# Patient Record
Sex: Female | Born: 1970 | Race: Black or African American | Hispanic: No | Marital: Married | Smoking: Never smoker
Health system: Southern US, Community
[De-identification: ages and names within clinical notes are randomized; demographics above are authoritative.]

## PROBLEM LIST (undated history)

## (undated) ENCOUNTER — Ambulatory Visit (HOSPITAL_COMMUNITY): Admission: EM

## (undated) ENCOUNTER — Emergency Department (HOSPITAL_COMMUNITY): Admission: EM | Discharge status: Absent without leave | Payer: Medicare Other

## (undated) DIAGNOSIS — Z87442 Personal history of urinary calculi: Secondary | ICD-10-CM

## (undated) DIAGNOSIS — R011 Cardiac murmur, unspecified: Secondary | ICD-10-CM

## (undated) DIAGNOSIS — IMO0002 Reserved for concepts with insufficient information to code with codable children: Secondary | ICD-10-CM

## (undated) DIAGNOSIS — M329 Systemic lupus erythematosus, unspecified: Secondary | ICD-10-CM

## (undated) DIAGNOSIS — M359 Systemic involvement of connective tissue, unspecified: Secondary | ICD-10-CM

## (undated) DIAGNOSIS — E785 Hyperlipidemia, unspecified: Secondary | ICD-10-CM

## (undated) DIAGNOSIS — M1990 Unspecified osteoarthritis, unspecified site: Secondary | ICD-10-CM

## (undated) DIAGNOSIS — R51 Headache: Secondary | ICD-10-CM

## (undated) DIAGNOSIS — K3184 Gastroparesis: Secondary | ICD-10-CM

## (undated) DIAGNOSIS — F32A Depression, unspecified: Secondary | ICD-10-CM

## (undated) DIAGNOSIS — F329 Major depressive disorder, single episode, unspecified: Secondary | ICD-10-CM

## (undated) HISTORY — PX: OTHER SURGICAL HISTORY: SHX169

## (undated) HISTORY — PX: TUBAL LIGATION: SHX77

---

## 2003-09-27 ENCOUNTER — Other Ambulatory Visit: Admission: RE | Admit: 2003-09-27 | Discharge: 2003-09-27 | Payer: Self-pay | Admitting: Obstetrics and Gynecology

## 2004-03-29 ENCOUNTER — Ambulatory Visit: Payer: Self-pay | Admitting: Family Medicine

## 2004-03-29 ENCOUNTER — Inpatient Hospital Stay (HOSPITAL_COMMUNITY): Admission: AD | Admit: 2004-03-29 | Discharge: 2004-04-02 | Payer: Self-pay | Admitting: Family Medicine

## 2004-04-09 ENCOUNTER — Ambulatory Visit: Payer: Self-pay | Admitting: *Deleted

## 2004-04-17 ENCOUNTER — Ambulatory Visit: Payer: Self-pay | Admitting: *Deleted

## 2004-05-01 ENCOUNTER — Ambulatory Visit: Payer: Self-pay | Admitting: Obstetrics & Gynecology

## 2004-05-15 ENCOUNTER — Ambulatory Visit: Payer: Self-pay | Admitting: Family Medicine

## 2004-05-29 ENCOUNTER — Ambulatory Visit: Payer: Self-pay | Admitting: Obstetrics & Gynecology

## 2004-06-05 ENCOUNTER — Ambulatory Visit: Payer: Self-pay | Admitting: Obstetrics & Gynecology

## 2004-06-10 ENCOUNTER — Inpatient Hospital Stay (HOSPITAL_COMMUNITY): Admission: AD | Admit: 2004-06-10 | Discharge: 2004-06-12 | Payer: Self-pay | Admitting: Obstetrics & Gynecology

## 2004-06-10 ENCOUNTER — Ambulatory Visit: Payer: Self-pay | Admitting: Family Medicine

## 2004-06-18 ENCOUNTER — Ambulatory Visit (HOSPITAL_COMMUNITY): Admission: RE | Admit: 2004-06-18 | Discharge: 2004-06-18 | Payer: Self-pay | Admitting: *Deleted

## 2004-06-27 ENCOUNTER — Inpatient Hospital Stay (HOSPITAL_COMMUNITY): Admission: AD | Admit: 2004-06-27 | Discharge: 2004-06-27 | Payer: Self-pay | Admitting: Obstetrics & Gynecology

## 2004-06-27 ENCOUNTER — Ambulatory Visit: Payer: Self-pay | Admitting: Family Medicine

## 2004-06-28 ENCOUNTER — Inpatient Hospital Stay (HOSPITAL_COMMUNITY): Admission: AD | Admit: 2004-06-28 | Discharge: 2004-06-28 | Payer: Self-pay | Admitting: Obstetrics and Gynecology

## 2004-07-04 ENCOUNTER — Ambulatory Visit: Payer: Self-pay | Admitting: Family Medicine

## 2004-07-08 ENCOUNTER — Ambulatory Visit (HOSPITAL_COMMUNITY): Admission: RE | Admit: 2004-07-08 | Discharge: 2004-07-08 | Payer: Self-pay | Admitting: *Deleted

## 2004-07-18 ENCOUNTER — Ambulatory Visit: Payer: Self-pay | Admitting: *Deleted

## 2004-08-07 ENCOUNTER — Inpatient Hospital Stay (HOSPITAL_COMMUNITY): Admission: AD | Admit: 2004-08-07 | Discharge: 2004-08-16 | Payer: Self-pay | Admitting: Obstetrics and Gynecology

## 2004-08-18 ENCOUNTER — Inpatient Hospital Stay (HOSPITAL_COMMUNITY): Admission: AD | Admit: 2004-08-18 | Discharge: 2004-08-19 | Payer: Self-pay | Admitting: Obstetrics and Gynecology

## 2004-08-23 ENCOUNTER — Inpatient Hospital Stay (HOSPITAL_COMMUNITY): Admission: AD | Admit: 2004-08-23 | Discharge: 2004-08-23 | Payer: Self-pay | Admitting: Obstetrics and Gynecology

## 2004-08-30 ENCOUNTER — Inpatient Hospital Stay (HOSPITAL_COMMUNITY): Admission: AD | Admit: 2004-08-30 | Discharge: 2004-08-30 | Payer: Self-pay | Admitting: Obstetrics and Gynecology

## 2004-09-28 ENCOUNTER — Inpatient Hospital Stay (HOSPITAL_COMMUNITY): Admission: AD | Admit: 2004-09-28 | Discharge: 2004-09-28 | Payer: Self-pay | Admitting: Obstetrics and Gynecology

## 2004-10-04 ENCOUNTER — Inpatient Hospital Stay (HOSPITAL_COMMUNITY): Admission: AD | Admit: 2004-10-04 | Discharge: 2004-10-04 | Payer: Self-pay | Admitting: Obstetrics & Gynecology

## 2004-10-10 ENCOUNTER — Inpatient Hospital Stay (HOSPITAL_COMMUNITY): Admission: AD | Admit: 2004-10-10 | Discharge: 2004-10-10 | Payer: Self-pay | Admitting: Obstetrics & Gynecology

## 2004-10-18 ENCOUNTER — Inpatient Hospital Stay (HOSPITAL_COMMUNITY): Admission: AD | Admit: 2004-10-18 | Discharge: 2004-10-18 | Payer: Self-pay | Admitting: Obstetrics and Gynecology

## 2004-10-28 ENCOUNTER — Inpatient Hospital Stay (HOSPITAL_COMMUNITY): Admission: AD | Admit: 2004-10-28 | Discharge: 2004-11-05 | Payer: Self-pay | Admitting: *Deleted

## 2004-11-02 ENCOUNTER — Encounter (INDEPENDENT_AMBULATORY_CARE_PROVIDER_SITE_OTHER): Payer: Self-pay | Admitting: Specialist

## 2004-11-30 ENCOUNTER — Inpatient Hospital Stay (HOSPITAL_COMMUNITY): Admission: AD | Admit: 2004-11-30 | Discharge: 2004-11-30 | Payer: Self-pay | Admitting: Obstetrics and Gynecology

## 2005-03-19 ENCOUNTER — Emergency Department (HOSPITAL_COMMUNITY): Admission: EM | Admit: 2005-03-19 | Discharge: 2005-03-19 | Payer: Self-pay | Admitting: Emergency Medicine

## 2006-06-01 ENCOUNTER — Emergency Department (HOSPITAL_COMMUNITY): Admission: EM | Admit: 2006-06-01 | Discharge: 2006-06-02 | Payer: Self-pay | Admitting: Emergency Medicine

## 2009-11-05 ENCOUNTER — Emergency Department (HOSPITAL_COMMUNITY): Admission: EM | Admit: 2009-11-05 | Discharge: 2009-11-05 | Payer: Self-pay | Admitting: Emergency Medicine

## 2009-11-26 ENCOUNTER — Encounter: Admission: RE | Admit: 2009-11-26 | Discharge: 2009-11-26 | Payer: Self-pay | Admitting: Rheumatology

## 2010-02-16 ENCOUNTER — Emergency Department (HOSPITAL_COMMUNITY): Admission: EM | Admit: 2010-02-16 | Discharge: 2010-02-16 | Payer: Self-pay | Admitting: Emergency Medicine

## 2010-08-17 ENCOUNTER — Encounter: Payer: Self-pay | Admitting: *Deleted

## 2010-08-18 ENCOUNTER — Encounter: Payer: Self-pay | Admitting: Obstetrics and Gynecology

## 2010-09-05 ENCOUNTER — Inpatient Hospital Stay (HOSPITAL_COMMUNITY)
Admission: EM | Admit: 2010-09-05 | Discharge: 2010-09-07 | DRG: 452 | Disposition: A | Discharge status: Home / Self Care | Payer: BC Managed Care – PPO | Attending: Internal Medicine | Admitting: Internal Medicine

## 2010-09-05 DIAGNOSIS — G43909 Migraine, unspecified, not intractable, without status migrainosus: Secondary | ICD-10-CM | POA: Diagnosis present

## 2010-09-05 DIAGNOSIS — Z794 Long term (current) use of insulin: Secondary | ICD-10-CM

## 2010-09-05 DIAGNOSIS — E860 Dehydration: Secondary | ICD-10-CM | POA: Diagnosis present

## 2010-09-05 DIAGNOSIS — M359 Systemic involvement of connective tissue, unspecified: Secondary | ICD-10-CM | POA: Diagnosis present

## 2010-09-05 DIAGNOSIS — T85694A Other mechanical complication of insulin pump, initial encounter: Principal | ICD-10-CM | POA: Diagnosis present

## 2010-09-05 DIAGNOSIS — Y849 Medical procedure, unspecified as the cause of abnormal reaction of the patient, or of later complication, without mention of misadventure at the time of the procedure: Secondary | ICD-10-CM | POA: Diagnosis present

## 2010-09-05 DIAGNOSIS — E1010 Type 1 diabetes mellitus with ketoacidosis without coma: Secondary | ICD-10-CM | POA: Diagnosis present

## 2010-09-05 DIAGNOSIS — D72829 Elevated white blood cell count, unspecified: Secondary | ICD-10-CM | POA: Diagnosis present

## 2010-09-05 LAB — CBC
HCT: 41.2 % (ref 36.0–46.0)
Hemoglobin: 13.9 g/dL (ref 12.0–15.0)
MCH: 30.1 pg (ref 26.0–34.0)
MCHC: 33.7 g/dL (ref 30.0–36.0)
MCV: 89.2 fL (ref 78.0–100.0)
Platelets: 354 10*3/uL (ref 150–400)
RBC: 4.62 MIL/uL (ref 3.87–5.11)
RDW: 12.6 % (ref 11.5–15.5)
WBC: 13.3 10*3/uL — ABNORMAL HIGH (ref 4.0–10.5)

## 2010-09-05 LAB — DIFFERENTIAL
Basophils Absolute: 0 10*3/uL (ref 0.0–0.1)
Basophils Relative: 0 % (ref 0–1)
Eosinophils Absolute: 0 10*3/uL (ref 0.0–0.7)
Eosinophils Relative: 0 % (ref 0–5)
Lymphocytes Relative: 5 % — ABNORMAL LOW (ref 12–46)
Lymphs Abs: 0.7 10*3/uL (ref 0.7–4.0)
Monocytes Absolute: 0.2 10*3/uL (ref 0.1–1.0)
Monocytes Relative: 2 % — ABNORMAL LOW (ref 3–12)
Neutro Abs: 12.4 10*3/uL — ABNORMAL HIGH (ref 1.7–7.7)
Neutrophils Relative %: 93 % — ABNORMAL HIGH (ref 43–77)

## 2010-09-05 LAB — BASIC METABOLIC PANEL
BUN: 31 mg/dL — ABNORMAL HIGH (ref 6–23)
BUN: 20 mg/dL (ref 6–23)
CO2: 17 mEq/L — ABNORMAL LOW (ref 19–32)
CO2: 19 mEq/L (ref 19–32)
Calcium: 9.8 mg/dL (ref 8.4–10.5)
Calcium: 8.9 mg/dL (ref 8.4–10.5)
Chloride: 88 mEq/L — ABNORMAL LOW (ref 96–112)
Chloride: 102 mEq/L (ref 96–112)
Creatinine, Ser: 1.3 mg/dL — ABNORMAL HIGH (ref 0.4–1.2)
Creatinine, Ser: 0.83 mg/dL (ref 0.4–1.2)
GFR calc Af Amer: 55 mL/min — ABNORMAL LOW (ref >60)
GFR calc Af Amer: >60 mL/min (ref >60)
GFR calc non Af Amer: 45 mL/min — ABNORMAL LOW (ref >60)
GFR calc non Af Amer: >60 mL/min (ref >60)
Glucose, Bld: 591 mg/dL (ref 70–99)
Glucose, Bld: 156 mg/dL — ABNORMAL HIGH (ref 70–99)
Potassium: 4.7 mEq/L (ref 3.5–5.1)
Potassium: 4.2 mEq/L (ref 3.5–5.1)
Sodium: 129 mEq/L — ABNORMAL LOW (ref 135–145)
Sodium: 133 mEq/L — ABNORMAL LOW (ref 135–145)

## 2010-09-05 LAB — URINALYSIS, ROUTINE W REFLEX MICROSCOPIC
Bilirubin Urine: NEGATIVE
Hgb urine dipstick: NEGATIVE
Ketones, ur: >80 mg/dL — AB
Leukocytes, UA: NEGATIVE
Nitrite: NEGATIVE
Protein, ur: NEGATIVE mg/dL
Specific Gravity, Urine: 1.029 (ref 1.005–1.030)
Urine Glucose, Fasting: >1000 mg/dL — AB
Urobilinogen, UA: 0.2 mg/dL (ref 0.0–1.0)
pH: 5 (ref 5.0–8.0)

## 2010-09-05 LAB — MAGNESIUM: Magnesium: 2.2 mg/dL (ref 1.5–2.5)

## 2010-09-05 LAB — POCT I-STAT 3, ART BLOOD GAS (G3+)
Acid-base deficit: 13 mmol/L — ABNORMAL HIGH (ref 0.0–2.0)
Bicarbonate: 13.6 mEq/L — ABNORMAL LOW (ref 20.0–24.0)
O2 Saturation: 95 %
TCO2: 15 mmol/L (ref 0–100)
pCO2 arterial: 32.6 mmHg — ABNORMAL LOW (ref 35.0–45.0)
pH, Arterial: 7.227 — ABNORMAL LOW (ref 7.350–7.400)
pO2, Arterial: 91 mmHg (ref 80.0–100.0)

## 2010-09-05 LAB — GLUCOSE, CAPILLARY
Glucose-Capillary: 132 mg/dL — ABNORMAL HIGH (ref 70–99)
Glucose-Capillary: 181 mg/dL — ABNORMAL HIGH (ref 70–99)
Glucose-Capillary: 223 mg/dL — ABNORMAL HIGH (ref 70–99)
Glucose-Capillary: 260 mg/dL — ABNORMAL HIGH (ref 70–99)
Glucose-Capillary: 322 mg/dL — ABNORMAL HIGH (ref 70–99)
Glucose-Capillary: 336 mg/dL — ABNORMAL HIGH (ref 70–99)
Glucose-Capillary: 505 mg/dL — ABNORMAL HIGH (ref 70–99)
Glucose-Capillary: >600 mg/dL (ref 70–99)

## 2010-09-05 LAB — PHOSPHORUS: Phosphorus: 5.9 mg/dL — ABNORMAL HIGH (ref 2.3–4.6)

## 2010-09-05 LAB — URINE MICROSCOPIC-ADD ON

## 2010-09-05 LAB — PREGNANCY, URINE: Preg Test, Ur: NEGATIVE

## 2010-09-05 LAB — POCT I-STAT 3, VENOUS BLOOD GAS (G3P V)
Acid-base deficit: 9 mmol/L — ABNORMAL HIGH (ref 0.0–2.0)
Bicarbonate: 18.1 mEq/L — ABNORMAL LOW (ref 20.0–24.0)
O2 Saturation: 32 %
TCO2: 19 mmol/L (ref 0–100)
pCO2, Ven: 40.9 mmHg — ABNORMAL LOW (ref 45.0–50.0)
pH, Ven: 7.254 (ref 7.250–7.300)
pO2, Ven: 23 mmHg — CL (ref 30.0–45.0)

## 2010-09-05 LAB — KETONES, QUALITATIVE

## 2010-09-06 LAB — BASIC METABOLIC PANEL
BUN: 17 mg/dL (ref 6–23)
BUN: 13 mg/dL (ref 6–23)
BUN: 10 mg/dL (ref 6–23)
BUN: 7 mg/dL (ref 6–23)
BUN: 10 mg/dL (ref 6–23)
CO2: 23 mEq/L (ref 19–32)
CO2: 23 mEq/L (ref 19–32)
CO2: 23 mEq/L (ref 19–32)
CO2: 24 mEq/L (ref 19–32)
CO2: 22 mEq/L (ref 19–32)
Calcium: 8.5 mg/dL (ref 8.4–10.5)
Calcium: 8.5 mg/dL (ref 8.4–10.5)
Calcium: 8.3 mg/dL — ABNORMAL LOW (ref 8.4–10.5)
Calcium: 8.4 mg/dL (ref 8.4–10.5)
Calcium: 8.3 mg/dL — ABNORMAL LOW (ref 8.4–10.5)
Chloride: 105 mEq/L (ref 96–112)
Chloride: 105 mEq/L (ref 96–112)
Chloride: 105 mEq/L (ref 96–112)
Chloride: 107 mEq/L (ref 96–112)
Chloride: 110 mEq/L (ref 96–112)
Creatinine, Ser: 0.73 mg/dL (ref 0.4–1.2)
Creatinine, Ser: 0.64 mg/dL (ref 0.4–1.2)
Creatinine, Ser: 0.67 mg/dL (ref 0.4–1.2)
Creatinine, Ser: 0.56 mg/dL (ref 0.4–1.2)
Creatinine, Ser: 0.6 mg/dL (ref 0.4–1.2)
GFR calc Af Amer: >60 mL/min (ref >60)
GFR calc Af Amer: >60 mL/min (ref >60)
GFR calc Af Amer: >60 mL/min (ref >60)
GFR calc Af Amer: >60 mL/min (ref >60)
GFR calc Af Amer: >60 mL/min (ref >60)
GFR calc non Af Amer: >60 mL/min (ref >60)
GFR calc non Af Amer: >60 mL/min (ref >60)
GFR calc non Af Amer: >60 mL/min (ref >60)
GFR calc non Af Amer: >60 mL/min (ref >60)
GFR calc non Af Amer: >60 mL/min (ref >60)
Glucose, Bld: 175 mg/dL — ABNORMAL HIGH (ref 70–99)
Glucose, Bld: 151 mg/dL — ABNORMAL HIGH (ref 70–99)
Glucose, Bld: 208 mg/dL — ABNORMAL HIGH (ref 70–99)
Glucose, Bld: 91 mg/dL (ref 70–99)
Glucose, Bld: 169 mg/dL — ABNORMAL HIGH (ref 70–99)
Potassium: 3.5 mEq/L (ref 3.5–5.1)
Potassium: 4 mEq/L (ref 3.5–5.1)
Potassium: 3.5 mEq/L (ref 3.5–5.1)
Potassium: 3.4 mEq/L — ABNORMAL LOW (ref 3.5–5.1)
Potassium: 3.1 mEq/L — ABNORMAL LOW (ref 3.5–5.1)
Sodium: 135 mEq/L (ref 135–145)
Sodium: 136 mEq/L (ref 135–145)
Sodium: 134 mEq/L — ABNORMAL LOW (ref 135–145)
Sodium: 137 mEq/L (ref 135–145)
Sodium: 139 mEq/L (ref 135–145)

## 2010-09-06 LAB — GLUCOSE, CAPILLARY
Glucose-Capillary: 104 mg/dL — ABNORMAL HIGH (ref 70–99)
Glucose-Capillary: 106 mg/dL — ABNORMAL HIGH (ref 70–99)
Glucose-Capillary: 135 mg/dL — ABNORMAL HIGH (ref 70–99)
Glucose-Capillary: 149 mg/dL — ABNORMAL HIGH (ref 70–99)
Glucose-Capillary: 163 mg/dL — ABNORMAL HIGH (ref 70–99)
Glucose-Capillary: 192 mg/dL — ABNORMAL HIGH (ref 70–99)
Glucose-Capillary: 194 mg/dL — ABNORMAL HIGH (ref 70–99)
Glucose-Capillary: 198 mg/dL — ABNORMAL HIGH (ref 70–99)
Glucose-Capillary: 209 mg/dL — ABNORMAL HIGH (ref 70–99)
Glucose-Capillary: 224 mg/dL — ABNORMAL HIGH (ref 70–99)
Glucose-Capillary: 232 mg/dL — ABNORMAL HIGH (ref 70–99)
Glucose-Capillary: 238 mg/dL — ABNORMAL HIGH (ref 70–99)
Glucose-Capillary: 264 mg/dL — ABNORMAL HIGH (ref 70–99)
Glucose-Capillary: 66 mg/dL — ABNORMAL LOW (ref 70–99)
Glucose-Capillary: 77 mg/dL (ref 70–99)
Glucose-Capillary: 94 mg/dL (ref 70–99)
Glucose-Capillary: 99 mg/dL (ref 70–99)

## 2010-09-06 LAB — CBC
HCT: 35.8 % — ABNORMAL LOW (ref 36.0–46.0)
Hemoglobin: 12.4 g/dL (ref 12.0–15.0)
MCH: 29.7 pg (ref 26.0–34.0)
MCHC: 34.6 g/dL (ref 30.0–36.0)
MCV: 85.6 fL (ref 78.0–100.0)
Platelets: 339 10*3/uL (ref 150–400)
RBC: 4.18 MIL/uL (ref 3.87–5.11)
RDW: 12.7 % (ref 11.5–15.5)
WBC: 6.3 10*3/uL (ref 4.0–10.5)

## 2010-09-06 LAB — MRSA PCR SCREENING: MRSA by PCR: NEGATIVE

## 2010-09-07 LAB — COMPREHENSIVE METABOLIC PANEL WITH GFR
ALT: 15 U/L (ref 0–35)
AST: 20 U/L (ref 0–37)
Albumin: 2.8 g/dL — ABNORMAL LOW (ref 3.5–5.2)
Alkaline Phosphatase: 56 U/L (ref 39–117)
BUN: 12 mg/dL (ref 6–23)
CO2: 26 meq/L (ref 19–32)
Calcium: 8.4 mg/dL (ref 8.4–10.5)
Chloride: 109 meq/L (ref 96–112)
Creatinine, Ser: 0.59 mg/dL (ref 0.4–1.2)
GFR calc non Af Amer: >60 mL/min
Glucose, Bld: 101 mg/dL — ABNORMAL HIGH (ref 70–99)
Potassium: 3.7 meq/L (ref 3.5–5.1)
Sodium: 141 meq/L (ref 135–145)
Total Bilirubin: 0.3 mg/dL (ref 0.3–1.2)
Total Protein: 5.5 g/dL — ABNORMAL LOW (ref 6.0–8.3)

## 2010-09-18 NOTE — H&P (Signed)
NAME:  Tiffany [name], Tiffany [name]                ACCOUNT NO.:  [id number]  MEDICAL RECORD NO.:  [id number]           PATIENT TYPE:  E  LOCATION:  [facility]                         FACILITY:  [facility]  PHYSICIAN:  Calvert Cantor, M.D.     DATE OF BIRTH:  12-Jul-1971  DATE OF ADMISSION:  09/05/2010 DATE OF DISCHARGE:                             HISTORY & PHYSICAL   PRIMARY CARE PHYSICIAN:  Gretta Arab. Valentina Lucks, MD.  PRESENTING COMPLAINTS:  High sugars, nausea, and joint pains.  HISTORY OF PRESENT ILLNESS:  This is a 40 year old female with type 1 diabetes, received an insulin pump 9 days ago.  The patient had the pump checked about a week ago and that was working properly.  Yesterday, however, her blood sugars started to rise and went above 600.  By bedtime, she was able to get her sugar down slightly to 575 and went to bed.  This morning, it was noted to be high again.  The patient started to feel nauseated.  She has also been noting multiple joint pains and swelling in her feet.  The patient also has a connective tissue disorder, which is yet to be defined and takes pain medications for her joint pain.  The patient does not admit to having any fevers, sweats, or chills.  PAST MEDICAL HISTORY: 1. Type 1 diabetes mellitus. 2. Connective tissue disorder.  She sees a physician by the name of     Dr. Lendon Colonel for this.  It was diagnosed about a year ago. 3. Migraines. 4. Heart murmur.  FAMILY HISTORY:  Heart disease, cancer, and lupus.  Mother had lung cancer.  SOCIAL HISTORY:  The patient does not smoke or drink or use any drugs. She is married with two children.  She works as a Social worker.  PAST SURGICAL HISTORY:  C-section x2.  ALLERGIES:  No known drug allergies.  MEDICATIONS:  Home medications are as follows: 1. Imitrex 50 mg 1 to 2 tablets as needed for migraine. 2. Lovastatin 10 mg at bedtime. 3. Meloxicam 7.5 mg 1 tablet twice a day. 4. Insulin pump.  REVIEW OF SYSTEMS:  No frequent  headaches.  She has had about 7-pound weight loss in the past couple weeks.  No double vision, but has some blurred vision.  No sore throat, sinus trouble, or earache. Respiratory:  No cough or shortness of breath.  No wheezing.  Cardiac: No chest pain or palpitations.  She does complain of some pedal edema, mainly in her feet.  GI:  Positive for nausea and vomiting today and constipation.  No abdominal pain.  GU:  No dysuria, hematuria, incontinence.  Hematologically, no easy bruising.  Skin:  No rash. Musculoskeletal:  Generalized joint pains.  She states her joints swell up as well.  Neurologically:  She has a small patch on her left shin, which is often numb and tingling and very itchy, otherwise no focal numbness, weakness, or tingling.  Psychologically, no anxiety or depression.  PHYSICAL EXAM:  VITALS:  Blood pressure 124/74, pulse 106, respiratory rate 22, temperature 98.5, oxygen saturation 96% on room air. HEENT:  Pupils equal, round, and reactive  to light.  Extraocular movements are intact.  Conjunctivae is pink, no scleral icterus.  Oral mucosa moist.  Normal dentition. NECK:  Supple.  No thyromegaly, lymphadenopathy, or carotid bruits. HEART:  Regular rate and rhythm.  No murmurs, rubs, or gallops. LUNGS:  Clear bilaterally.  Normal respiratory effort.  No use of accessory muscles. ABDOMEN:  Soft, nontender, and nondistended.  Bowel sounds positive.  No organomegaly. EXTREMITIES:  No noted cyanosis, clubbing, or edema. MUSCULOSKELETAL EXAM:  Joints are not swollen or tender currently. NEUROLOGICALLY:  Cranial nerves II through XII intact.  Strength intact in all four extremities. PSYCHOLOGICALLY:  Awake, alert, oriented x3.  Mood and affect normal. SKIN:  Warm and dry.  No rash or bruising.  LABORATORY DATA:  Blood work, sodium 129, potassium 4.7, chloride 88, bicarbonate 17, glucose 591, BUN 31, creatinine 1.30, pH 7.227, PCO2 of 32.6, bicarb is 13.6.  WBC count  13.2.  UA reveals greater than 1000 mg/dL of glucose and greater than 80 mg/dL of ketones, is negative for infection.  Specific gravity is 1.029.  ASSESSMENT/PLAN: 1. Diabetic ketoacidosis.  Her insulin pump has been removed. We will     request a consult with the diabetes nurse to evaluate her pump.     She will be on insulin drip in the Stepdown Unit until her diabetic     ketoacidosis resolves and then will be switched over to a     subcutaneous insulin. 2. Dehydration.  Currently, we will bolus her 2 liters of normal     saline and then continue fluids at 150 mL an hour. 3. Mild leukocytosis, likely secondary to diabetic ketoacidosis.  I     see no signs of inflammation in her joints currently. 4. Connective tissue disease.  Continue meloxicam for joint pains.  DVT prophylaxis with Lovenox.  TIME ON ADMISSION:  60 minutes.     Calvert Cantor, M.D.     SR/MEDQ  D:  09/05/2010  T:  09/05/2010  Job:  [id number]  cc:   Gretta Arab. Valentina Lucks, M.D.  Electronically Signed by Calvert Cantor M.D. on 09/18/2010 06:55:44 PM

## 2010-09-24 NOTE — Discharge Summary (Signed)
NAME:  [name], Tiffany                ACCOUNT NO.:  [id number]  MEDICAL RECORD NO.:  [id number]           PATIENT TYPE:  I  LOCATION:  3313                         FACILITY:  [facility]  PHYSICIAN:  Lonia Blood, M.D.DATE OF BIRTH:  Sep 08, 1970  DATE OF ADMISSION:  09/05/2010 DATE OF DISCHARGE:  09/07/2010                              DISCHARGE SUMMARY   PRIMARY CARE PHYSICIAN:  Tonita Cong, MD at [facility].  DISCHARGE DIAGNOSES: 1. Diabetic ketoacidosis. 2. Uncontrolled diabetes mellitus type 1 - secondary to insulin pump     malfunction. 3. Migraine headaches. 4. Connective tissue disorder, not otherwise specified.  DISCHARGE MEDICATIONS:  The patient is discharged on the exact same medications that she was taking at the time of her admission.  Complete list of her discharge medications is available as per the discharge med manager portion of her e-chart file at the [facility] computer system. Of note, she is resuming her insulin pump therapy at the time of her discharge without complications.  PROCEDURES:  None.  CONSULTATIONS:  Diabetes coordinator.  HOSPITAL COURSE:  Ms. Tiffany [name] is a very pleasant 40 year old type 1 diabetic who is under the care of Dr. Talmage Coin at [facility] at [facility].  She has newly begun treatment using an insulin pump in the last 9 days.  The day prior to her admission, she noted that her blood sugars began to rise.  They reached values exceeding 600.  As a result, she wisely presented to the hospital for evaluation.  At the time of her evaluation, she was found be suffering with DKA.  She was admitted to acute unit.  An insulin drip was initiated.  Her insulin pump was turned off.  Diabetes coordinator was consulted.  When the diabetes coordinator evaluated the patient and removed the patient's insulin pump cannula, it was found to be kicked and bent such that no infusion was possible from the  pump.  This was felt to be the cause of the patient's DKA via lack of insulin through the pump.  With administration of an IV insulin drip, the patient's DKA rapidly corrected as was expected.  No other complications were encountered.  No other physical complaints or exam findings were appreciable.  Prior to discontinuing the insulin drip, the patient's insulin pump was reinitiated.  The patient was given a minimum of 2 hours of overlap at which time her insulin drip was discontinued. Dextrose containing fluids were discontinued.  The patient was observed for additional 24 hours on her insulin pump alone.  She displayed knowledge on proper pump management and her blood sugars remained well- controlled with pump alone as her diabetes treatment.  The patient voices understanding of the idea of checking her infusion catheter and possibly even replacing her port if she develops severe hyperglycemia in the outpatient setting again.  The patient is scheduled to follow with Dr. Sharl Ma within the next 10 days.  She is advised to keep this scheduled followup.     Lonia Blood, M.D.     JTM/MEDQ  D:  09/07/2010  T:  09/07/2010  Job:  [id number]  cc:   Tonita Cong, M.D.  Electronically Signed by Jetty Duhamel M.D. on 09/24/2010 06:02:50 PM

## 2010-10-12 LAB — POCT I-STAT, CHEM 8
BUN: 21 mg/dL (ref 6–23)
Calcium, Ion: 1.08 mmol/L — ABNORMAL LOW (ref 1.12–1.32)
Chloride: 105 mEq/L (ref 96–112)
Creatinine, Ser: 0.7 mg/dL (ref 0.4–1.2)
Glucose, Bld: 113 mg/dL — ABNORMAL HIGH (ref 70–99)
HCT: 40 % (ref 36.0–46.0)
Hemoglobin: 13.6 g/dL (ref 12.0–15.0)
Potassium: 4.2 mEq/L (ref 3.5–5.1)
Sodium: 138 mEq/L (ref 135–145)
TCO2: 24 mmol/L (ref 0–100)

## 2010-10-12 LAB — GLUCOSE, CAPILLARY
Glucose-Capillary: 108 mg/dL — ABNORMAL HIGH (ref 70–99)
Glucose-Capillary: 191 mg/dL — ABNORMAL HIGH (ref 70–99)
Glucose-Capillary: 95 mg/dL (ref 70–99)

## 2010-10-16 LAB — D-DIMER, QUANTITATIVE: D-Dimer, Quant: <0.22 ug/mL-FEU (ref 0.00–0.48)

## 2010-10-16 LAB — POCT I-STAT, CHEM 8
BUN: 16 mg/dL (ref 6–23)
Calcium, Ion: 1.11 mmol/L — ABNORMAL LOW (ref 1.12–1.32)
Chloride: 102 mEq/L (ref 96–112)
Creatinine, Ser: 0.4 mg/dL (ref 0.4–1.2)
Glucose, Bld: 239 mg/dL — ABNORMAL HIGH (ref 70–99)
HCT: 38 % (ref 36.0–46.0)
Hemoglobin: 12.9 g/dL (ref 12.0–15.0)
Potassium: 3.8 mEq/L (ref 3.5–5.1)
Sodium: 141 mEq/L (ref 135–145)
TCO2: 31 mmol/L (ref 0–100)

## 2010-12-13 NOTE — Discharge Summary (Signed)
NAME:  Chopin, [place]                ACCOUNT NO.:  [id number]   MEDICAL RECORD NO.:  [id number]          PATIENT TYPE:  INP   LOCATION:  9152                          [facility]   PHYSICIAN:  Maxie Better, M.D.DATE OF BIRTH:  1970-11-03   DATE OF ADMISSION:  08/07/2004  DATE OF DISCHARGE:  08/16/2004                                 DISCHARGE SUMMARY   ADMISSION DIAGNOSES:  1.  Premature contractions.  2.  Class C insulin-dependent diabetes.  3.  Intrauterine gestation at 27 and four-sevenths weeks.  4.  Previous preterm delivery.  5.  Previous cesarean section.   DISCHARGE DIAGNOSES:  1.  Insulin-dependent diabetes.  2.  Premature contractions, improved.  3.  Intrauterine gestation at 10 and four-sevenths weeks.  4.  Polyhydramnios.   HISTORY OF PRESENT ILLNESS:  A 40 year old gravida 4 para 0-1-2-1 female at  29 and four-sevenths weeks gestation with insulin-dependent diabetes  admitted secondary to premature contractions. The patient's history is  notable for previous primary cesarean section at 24 weeks secondary to  oligohydramnios. The patient was subsequently diagnosed with diabetes about  a year after the delivery of her first child. Prenatal course has been  complicated by night right-sided back pain. She had a first trimester  urinary tract infection. The patient was complaining of contractions, no  vaginal bleeding, good fetal movement.   HOSPITAL COURSE:  The patient was initially seen in maternity admissions.  The fact that her right-sided back pain resolved in the a.m. after having  been present during the night was suggestive of muscle spasm and therefore  the Effexor was given. Urinalysis was negative. Urine culture was sent. The  patient was noted to be contracting. She was given subcutaneous terbutaline.  She was given a second dose of subcutaneous terbutaline due to ongoing  contractions and subsequently was placed on magnesium sulfate. The patient  was contracting about every 2 minutes. Besides the magnesium sulfate,  additional magnesium bolus was given and betamethasone was withheld  secondary to insulin-dependent diabetes and Motrin was started for 48 hours.  Neonatal consultation was obtained. Magnesium was continued. Cervical  ultrasound showed a good cervical length. Urine culture was subsequently  negative. On hospital day #3, the patient had some shortness of breath and  chest pain for which she subsequently had a STAT chest x-ray which showed  mild bibasilar atelectasis with possible mild interstitial edema. The  magnesium was therefore discontinued. The urine output and intake was  watched closely. Her ultrasound had revealed severe polyhydramnios and the  patient was given Nubain p.r.n. pain. Her blood sugars were continued to be  monitored. The thought was that the polyhydramnios was exacerbated secondary  to her contractions. The patient remained in-house. Discussion ensued  regarding therapeutic amniocentesis which was subsequently not done. The  patient also during her course had continued to have problems with  constipation. Incentive spirometry was given. Management was continued and  she was subsequently started on Procardia for continued contractions.  Subsequently, the patient was able to be discharged home on hospital day #10  with [facility] for monitoring of her  contractions. She was continued on her  insulin regimen.   DISPOSITION:  Home.   CONDITION:  Stable.   DISCHARGE FOLLOW-UP:  In about 48 hours at the office.   DISCHARGE INSTRUCTIONS:  Call for ruptured membranes, preterm labor  precautions, vaginal bleeding, decreased fetal movement.      /MEDQ  D:  11/18/2004  T:  11/19/2004  Job:  [id number]

## 2010-12-13 NOTE — Discharge Summary (Signed)
NAME:  Duquette, [place] L                          ACCOUNT NO.:  [id number]   MEDICAL RECORD NO.:  [id number]                   PATIENT TYPE:  INP   LOCATION:  9134                                 [facility]ILITY[facility]   PHYSICIAN:  Tanya S. Shawnie Pons, M.D.                DATE OF BIRTH:  09-11-1970   DATE OF ADMISSION:  03/29/2004  DATE OF DISCHARGE:  04/02/2004                                 DISCHARGE SUMMARY   ADMISSION DIAGNOSES:  1.  [age]-year-old gravida 4, para 0-1-2-1 at approximately 5 weeks'      and 5 days' gestation.  2.  Hyperglycemia.  3.  Insulin-dependent diabetes mellitus diagnosed 9 years ago.   DISCHARGE DIAGNOSES:  1.  [age]-year-old gravida 4, para 0-1-2-1 at 48 weeks' gestation with      controlled glycemia.  2.  Insulin-dependent diabetes mellitus.   DISCHARGE MEDICATIONS:  1.  NPH insulin 25 units subcu q.a.m., 18 units subcu nightly.  2.  Regular insulin 12 units subcu q.a.m., 9 units subcu every dinner.  3.  Prenatal vitamin 1 p.o. daily.   ADMISSION HISTORY:  Ms. Schmuck presented in her first trimester with  abdominal pain.  She states she has been noncompliant with her sugars and  they have been in the 300s.  She was admitted for glycemic control.   HOSPITAL COURSE:  PROBLEM #1 - DIABETES MELLITUS:  Her A1c on admission was  10.5.  She was not in ketosis, although was dehydrated clinically and by  urinalysis with a specific gravity greater than 1.030, and greater than 80  ketones in her urine.  She was placed on  Glucomander and given IV fluids.  Her CBGs came down into the normal range on day of discharge for the last 24  hours.  She is being discharged on the regimen that she was on for 24 hours.  A TSH was within normal limits at 2.261.  Her PIH panel was within normal  limits.  A 24-hour urine has been collected.  She will need appropriate  followup at high-risk clinic and this is being set up prior to her  discharge.   CONDITION ON DISCHARGE:   The patient was discharged to home in stable  condition.   INSTRUCTIONS GIVEN TO PATIENT:  The patient was told of her above medical  regimen.  She has also been made an appointment at high-risk clinic.     Jon Gills, M.D.                     Shelbie Proctor. Shawnie Pons, M.D.    LC/MEDQ  D:  04/02/2004  T:  04/02/2004  Job:  [id number]   cc:   Attn: Gildardo Pounds. Shawnie Pons, M.D. High-Risk Clinic, [facility]

## 2010-12-13 NOTE — Discharge Summary (Signed)
NAME:  Tiffany [name], Tiffany [name]                ACCOUNT NO.:  [id number]   MEDICAL RECORD NO.:  [id number]          PATIENT TYPE:  INP   LOCATION:  9317                          [facility]   PHYSICIAN:  Lesly Dukes, M.D. DATE OF BIRTH:  12-28-1970   DATE OF ADMISSION:  06/10/2004  DATE OF DISCHARGE:                                 DISCHARGE SUMMARY   ADMISSION DIAGNOSES:  95.  A 40 year old gravida 2 para 0-1-0-1 at 54 and 2 weeks with suspected      pyelonephritis due to flank pain.  2.  Reassuring fetal heart tracing by Doppler.  3.  Yeast vaginitis.  4.  Diabetes mellitus class C.   DISCHARGE DIAGNOSES:  45.  A 40 year old gravida 2 para 0-1-0-1 at 70 and 4 weeks with sacroiliac      joint pain.  2.  Diabetes mellitus class C, insulin-dependent.  3.  Reassuring fetal heart rate by Doppler.   DISCHARGE MEDICATIONS:  1.  NPH insulin 17 units subcu q.a.m. and 27 units subcu q.h.s.  2.  Humalog insulin 7 units subcu q.a.c.  3.  Prenatal vitamin one p.o. daily.   ADMISSION HISTORY:  [name] is admitted with suspected pyelonephritis due  to flank pain.  She was afebrile at the time and the urinalysis only showed  0-2 white blood cells, rare bacteria, and rare squamous cells.  She did have  treatment for GBS bacteruria recently and was placed on Pen-Vee K and  finished that course of antibiotics.  She is admitted for IV antibiotics and  observation.   #1 - RULE OUT PYELONEPHRITIS.  The patient's urine culture grew no bacteria.  A renal ultrasound was negative.  On reexamination on hospital day #1, the  patient actually had sacroiliac joint pain bilaterally and no flank pain.  She remained afebrile throughout her hospitalization.  She was placed on  cefoxitin for her hospitalization and will not be continued on antibiotics  after discharge.   #2 - INSULIN-DEPENDENT DIABETES.  The patient has been on insulin for about  10 years now.  Her last hemoglobin A1c was 10.6, which is poorly  controlled.  There is some noncompliance suspected.  She was continued on her normal home  regimen of insulin with pretty good glycemic control.  She is being  discharged on that regimen.   Throughout her hospitalization the fetal heart rate was reassuring by  Doppler.   The patient was also noted to have yeast on a wet prep.  Her GC and  chlamydia were negative.  She was treated with Diflucan.   CONDITION ON DISCHARGE:  The patient was discharged to home in stable  condition.   INSTRUCTIONS GIVEN TO PATIENT:  The patient was told of her above medical  regimen.  She will follow up with high risk clinic in 1 week.  She also is  going to follow up with the diabetes and nutrition center for more  education.      LC/MEDQ  D:  06/12/2004  T:  06/12/2004  Job:  [id number]   cc:   High Risk Clinic  at [facility]

## 2010-12-13 NOTE — Op Note (Signed)
NAME:  Tiffany [name], Tiffany [name]                ACCOUNT NO.:  [id number]   MEDICAL RECORD NO.:  [id number]          PATIENT TYPE:  OUT   LOCATION:  ULT                           [facility]   PHYSICIAN:  Maxie Better, M.D.DATE OF BIRTH:  1970-12-28   DATE OF PROCEDURE:  11/02/2004  DATE OF DISCHARGE:                                 OPERATIVE REPORT   PREOPERATIVE DIAGNOSES:  1.  Previous cesarean section.  2.  Labor.  3.  Desires sterilization.  4.  Insulin-dependent diabetic.  5.  Mild preeclampsia.  6.  Intrauterine gestation at 37 weeks.   POSTOPERATIVE DIAGNOSES:  1.  Labor.  2.  Intrauterine gestation at 37 weeks, delivered.  3.  Previous cesarean section.  4.  Desires sterilization.  5.  Insulin-dependent diabetic.  6.  Mild preeclampsia.   PROCEDURE:  Repeat cesarean section, modified Pomeroy tubal ligation, lysis  of adhesions.   ANESTHESIA:  Spinal.   SURGEON:  Maxie Better, M.D.   FINDINGS:  Midbody of the uterus attached to the left lower anterior  abdominal wall, omental adhesions adherent to the anterior abdominal wall,  left lateral aspect of the bladder adherent to the left anterior abdominal  wall, all lysed.  Live female weighing 6 pounds 14 ounces, with cord around  her abdomen x2 and right foot.  Apgars of 9 and 9.  Normal tubes and  ovaries.  A left posterior 2-cm subserosal fibroid.  Cord pH was 7.35.   DESCRIPTION OF PROCEDURE:  Under adequate spinal anesthesia, the patient was  placed in the supine position with a left lateral tilt.  She was sterilely  prepped and draped in the usual fashion.  An indwelling Foley catheter was  sterilely placed.  10 cc of 0.25% Marcaine was injected along the previous  Pfannenstiel skin incision.   After the patient received IV antibiotics, a Pfannenstiel skin incision was  made through the previous scar and carried down to the rectus fascia.  The  rectus fascia was incised transversely.  The rectus fascia was  then bluntly  and sharply dissected off of the rectus muscles.  On the dissection  superiorly, the parietal peritoneum was opened at which time the window in  the peritoneum was utilized to carefully dissect and separate the rectus  muscle which was densely adherent in the midline.  With careful dissection,  the rectus muscle was then split in the midline.  On entering the abdominal  cavity with manual exploration, it was then noted that about a 2-inch  portion of the serosa of the uterus was attached anteriorly in the left  lower quadrant.  Going around the adhesions, the uterine portion of the  adhesion was then brought up into the field.  Sharp dissection was then  performed.  The uterus was extra-rotated as a result of both the normal  rotation in a pregnancy gravid uterus as well as from the adhesions.  At  that point, there were bladder adhesions noted, particularly on the left  lower quadrant anterior abdominal wall as well, and these were lysed.  The  vesicouterine peritoneum was  then subsequently opened transversely and  extended.  The bladder was then bluntly dissected off of the lower uterine  segment and displaced inferiorly using the bladder retractor.  A curvilinear  low transverse uterine incision was then made and extended bilaterally using  bandage scissors.  A copious amounts of amniotic clear fluid was noted.  Subsequently, the delivery of the live female from the occiput anterior  position was accomplished.  The baby was bulb suctioned on the abdomen.  The  cord around the abdomen x2 and the right foot was released.  The cord was  then clamped and cut.  The baby was transferred to the awaiting pediatrician  who assigned Apgars of 9 and 9 at one and five minutes.  The placenta was  spontaneously intact and sent to pathology.  The uterus was then  exteriorized.  The uterine incision was then closed in two layers, the first  layer with 0 Monocryl running locking stitch, and  the second layer was  imbricated using a 0 Monocryl suture.  The serosal surface of the previously  adherent area was inspected and was noted to be bleeding.  0 Monocryl  sutures were then used to approximate the defect, and attention was then  turned to the tubes and ovaries.   The midportion of the left fallopian tube was then grasped with a Babcock.  The underlying mesosalpinx was opened with cautery.  A 0 chromic suture was  then tied x2 proximally and distally, and the intervening portion of the  tube was then removed.  The pedicle was then cauterized.  The same procedure  was performed on the right tube, removing a midportion of that tube as well.   The abdomen was then copiously irrigated and suctioned of debris.  Small  bleeders were then cauterized.  The uterus was then returned to the abdomen.  The fallopian tubes were inspected bilaterally and still remained with the  sutures on the tubes.  The omental adhesion which was on the anterior  abdominal wall was then found and lysed.  No other adhesions were noted.  The abdomen was once again irrigated and suctioned, and the uterine incision  was then inspected.  Good hemostasis was then noted.  The serosal defect  that had been closed was inspected.  Additional cauterization was used for  small bleeders.  Interceed was placed overlying that area, with good  hemostasis then noted.  The parietal peritoneum was closed with 2-0 Vicryl  suture.  The small window with the closure was then noted and 3-0 Vicryl  sutures then used to close those areas as well.  The undersurface of the  rectus fascia was inspected and bleeders cauterized.  The rectus fascia was  closed with 0 Vicryl x2.  The subcutaneous area was irrigated and suctioned.  Small bleeders were cauterized.  Scar tissue in that area was released to  allow for approximation of the skin.  The skin was then approximated using staples.  The specimen was the placenta and portions of  the right and left  fallopian tubes sent to pathology.  Cord pH was 7.35.  Estimated blood loss  was 600 cc.  Intraoperative fluid was 2 L.  Urine output was 550 cc of clear  yellow urine.  Sponge and instrument counts x2 were correct.  There were no  complications.   The patient tolerated the procedure well and was transferred to the recovery  room in stable condition.      [address]/MEDQ  D:  11/02/2004  T:  11/03/2004  Job:  [id number]

## 2011-04-16 ENCOUNTER — Emergency Department (HOSPITAL_COMMUNITY)
Admission: EM | Admit: 2011-04-16 | Discharge: 2011-04-16 | Disposition: A | Discharge status: Home / Self Care | Payer: BC Managed Care – PPO | Attending: Emergency Medicine | Admitting: Emergency Medicine

## 2011-04-16 DIAGNOSIS — N390 Urinary tract infection, site not specified: Secondary | ICD-10-CM | POA: Insufficient documentation

## 2011-04-16 DIAGNOSIS — G43909 Migraine, unspecified, not intractable, without status migrainosus: Secondary | ICD-10-CM | POA: Insufficient documentation

## 2011-04-16 DIAGNOSIS — M069 Rheumatoid arthritis, unspecified: Secondary | ICD-10-CM | POA: Insufficient documentation

## 2011-04-16 DIAGNOSIS — Z79899 Other long term (current) drug therapy: Secondary | ICD-10-CM | POA: Insufficient documentation

## 2011-04-16 DIAGNOSIS — R109 Unspecified abdominal pain: Secondary | ICD-10-CM | POA: Insufficient documentation

## 2011-04-16 DIAGNOSIS — E109 Type 1 diabetes mellitus without complications: Secondary | ICD-10-CM | POA: Insufficient documentation

## 2011-04-16 LAB — URINE MICROSCOPIC-ADD ON

## 2011-04-16 LAB — POCT I-STAT, CHEM 8
BUN: 24 mg/dL — ABNORMAL HIGH (ref 6–23)
Calcium, Ion: 1.13 mmol/L (ref 1.12–1.32)
Chloride: 102 mEq/L (ref 96–112)
Creatinine, Ser: 0.8 mg/dL (ref 0.50–1.10)
Glucose, Bld: 63 mg/dL — ABNORMAL LOW (ref 70–99)
HCT: 42 % (ref 36.0–46.0)
Hemoglobin: 14.3 g/dL (ref 12.0–15.0)
Potassium: 4.2 mEq/L (ref 3.5–5.1)
Sodium: 136 mEq/L (ref 135–145)
TCO2: 27 mmol/L (ref 0–100)

## 2011-04-16 LAB — URINALYSIS, ROUTINE W REFLEX MICROSCOPIC
Bilirubin Urine: NEGATIVE
Glucose, UA: >1000 mg/dL — AB
Hgb urine dipstick: NEGATIVE
Ketones, ur: 15 mg/dL — AB
Nitrite: NEGATIVE
Protein, ur: NEGATIVE mg/dL
Specific Gravity, Urine: 1.034 — ABNORMAL HIGH (ref 1.005–1.030)
Urobilinogen, UA: 1 mg/dL (ref 0.0–1.0)
pH: 5.5 (ref 5.0–8.0)

## 2011-04-16 LAB — POCT PREGNANCY, URINE: Preg Test, Ur: NEGATIVE

## 2011-04-16 LAB — GLUCOSE, CAPILLARY: Glucose-Capillary: 110 mg/dL — ABNORMAL HIGH (ref 70–99)

## 2011-04-18 LAB — URINE CULTURE
Colony Count: NO GROWTH
Culture  Setup Time: 201209200826
Culture: NO GROWTH

## 2011-05-30 ENCOUNTER — Other Ambulatory Visit: Payer: Self-pay | Admitting: Family Medicine

## 2011-05-30 DIAGNOSIS — M545 Low back pain, unspecified: Secondary | ICD-10-CM

## 2011-05-30 DIAGNOSIS — M5410 Radiculopathy, site unspecified: Secondary | ICD-10-CM

## 2011-05-31 ENCOUNTER — Ambulatory Visit
Admission: RE | Admit: 2011-05-31 | Discharge: 2011-05-31 | Disposition: A | Discharge status: Home / Self Care | Payer: BC Managed Care – PPO | Source: Ambulatory Visit | Attending: Family Medicine | Admitting: Family Medicine

## 2011-05-31 DIAGNOSIS — M545 Low back pain, unspecified: Secondary | ICD-10-CM

## 2011-05-31 DIAGNOSIS — M5410 Radiculopathy, site unspecified: Secondary | ICD-10-CM

## 2011-11-28 ENCOUNTER — Emergency Department (HOSPITAL_COMMUNITY): Payer: BC Managed Care – PPO

## 2011-11-28 ENCOUNTER — Encounter (HOSPITAL_COMMUNITY): Payer: Self-pay | Admitting: Emergency Medicine

## 2011-11-28 ENCOUNTER — Emergency Department (HOSPITAL_COMMUNITY)
Admission: EM | Admit: 2011-11-28 | Discharge: 2011-11-29 | Disposition: A | Discharge status: Home / Self Care | Payer: BC Managed Care – PPO | Attending: Emergency Medicine | Admitting: Emergency Medicine

## 2011-11-28 DIAGNOSIS — Z79899 Other long term (current) drug therapy: Secondary | ICD-10-CM | POA: Insufficient documentation

## 2011-11-28 DIAGNOSIS — M79609 Pain in unspecified limb: Secondary | ICD-10-CM | POA: Insufficient documentation

## 2011-11-28 DIAGNOSIS — R000 Tachycardia, unspecified: Secondary | ICD-10-CM | POA: Insufficient documentation

## 2011-11-28 DIAGNOSIS — E119 Type 2 diabetes mellitus without complications: Secondary | ICD-10-CM | POA: Insufficient documentation

## 2011-11-28 DIAGNOSIS — R350 Frequency of micturition: Secondary | ICD-10-CM | POA: Insufficient documentation

## 2011-11-28 DIAGNOSIS — R109 Unspecified abdominal pain: Secondary | ICD-10-CM | POA: Insufficient documentation

## 2011-11-28 DIAGNOSIS — R5383 Other fatigue: Secondary | ICD-10-CM | POA: Insufficient documentation

## 2011-11-28 DIAGNOSIS — M069 Rheumatoid arthritis, unspecified: Secondary | ICD-10-CM | POA: Insufficient documentation

## 2011-11-28 DIAGNOSIS — R739 Hyperglycemia, unspecified: Secondary | ICD-10-CM

## 2011-11-28 DIAGNOSIS — R5381 Other malaise: Secondary | ICD-10-CM | POA: Insufficient documentation

## 2011-11-28 DIAGNOSIS — Z794 Long term (current) use of insulin: Secondary | ICD-10-CM | POA: Insufficient documentation

## 2011-11-28 DIAGNOSIS — R112 Nausea with vomiting, unspecified: Secondary | ICD-10-CM | POA: Insufficient documentation

## 2011-11-28 HISTORY — DX: Unspecified osteoarthritis, unspecified site: M19.90

## 2011-11-28 HISTORY — DX: Cardiac murmur, unspecified: R01.1

## 2011-11-28 LAB — POCT PREGNANCY, URINE: Preg Test, Ur: NEGATIVE

## 2011-11-28 LAB — GLUCOSE, CAPILLARY
Glucose-Capillary: 173 mg/dL — ABNORMAL HIGH (ref 70–99)
Glucose-Capillary: 418 mg/dL — ABNORMAL HIGH (ref 70–99)

## 2011-11-28 LAB — DIFFERENTIAL
Basophils Absolute: 0 10*3/uL (ref 0.0–0.1)
Basophils Relative: 0 % (ref 0–1)
Eosinophils Absolute: 0 10*3/uL (ref 0.0–0.7)
Eosinophils Relative: 0 % (ref 0–5)
Lymphocytes Relative: 17 % (ref 12–46)
Lymphs Abs: 1.5 10*3/uL (ref 0.7–4.0)
Monocytes Absolute: 0.3 10*3/uL (ref 0.1–1.0)
Monocytes Relative: 3 % (ref 3–12)
Neutro Abs: 7.1 10*3/uL (ref 1.7–7.7)
Neutrophils Relative %: 80 % — ABNORMAL HIGH (ref 43–77)

## 2011-11-28 LAB — BASIC METABOLIC PANEL
BUN: 20 mg/dL (ref 6–23)
CO2: 22 mEq/L (ref 19–32)
Calcium: 10.3 mg/dL (ref 8.4–10.5)
Chloride: 89 mEq/L — ABNORMAL LOW (ref 96–112)
Creatinine, Ser: 0.73 mg/dL (ref 0.50–1.10)
GFR calc Af Amer: >90 mL/min (ref >90)
GFR calc non Af Amer: >90 mL/min (ref >90)
Glucose, Bld: 414 mg/dL — ABNORMAL HIGH (ref 70–99)
Potassium: 4.7 mEq/L (ref 3.5–5.1)
Sodium: 131 mEq/L — ABNORMAL LOW (ref 135–145)

## 2011-11-28 LAB — LIPASE, BLOOD: Lipase: 11 U/L (ref 11–59)

## 2011-11-28 LAB — HEPATIC FUNCTION PANEL
ALT: 11 U/L (ref 0–35)
AST: 18 U/L (ref 0–37)
Albumin: 4.3 g/dL (ref 3.5–5.2)
Alkaline Phosphatase: 96 U/L (ref 39–117)
Bilirubin, Direct: 0.1 mg/dL (ref 0.0–0.3)
Indirect Bilirubin: 0.5 mg/dL (ref 0.3–0.9)
Total Bilirubin: 0.6 mg/dL (ref 0.3–1.2)
Total Protein: 8.5 g/dL — ABNORMAL HIGH (ref 6.0–8.3)

## 2011-11-28 LAB — CBC
HCT: 40 % (ref 36.0–46.0)
Hemoglobin: 13.7 g/dL (ref 12.0–15.0)
MCH: 29.5 pg (ref 26.0–34.0)
MCHC: 34.3 g/dL (ref 30.0–36.0)
MCV: 86 fL (ref 78.0–100.0)
Platelets: 333 10*3/uL (ref 150–400)
RBC: 4.65 MIL/uL (ref 3.87–5.11)
RDW: 12.5 % (ref 11.5–15.5)
WBC: 8.9 10*3/uL (ref 4.0–10.5)

## 2011-11-28 MED ORDER — SODIUM CHLORIDE 0.9 % IV BOLUS (SEPSIS)
1000.0000 mL | Freq: Once | INTRAVENOUS | Status: AC
  Administered 2011-11-28: 1000 mL via INTRAVENOUS

## 2011-11-28 MED ORDER — MORPHINE SULFATE 4 MG/ML IJ SOLN
4.0000 mg | Freq: Once | INTRAMUSCULAR | Status: AC
  Administered 2011-11-28: 4 mg via INTRAVENOUS
  Filled 2011-11-28: qty 1

## 2011-11-28 MED ORDER — ONDANSETRON HCL 4 MG/2ML IJ SOLN
4.0000 mg | Freq: Once | INTRAMUSCULAR | Status: AC
  Administered 2011-11-28: 4 mg via INTRAVENOUS
  Filled 2011-11-28: qty 2

## 2011-11-28 MED ORDER — INSULIN ASPART 100 UNIT/ML ~~LOC~~ SOLN
10.0000 [IU] | Freq: Once | SUBCUTANEOUS | Status: AC
  Administered 2011-11-28: 10 [IU] via INTRAVENOUS
  Filled 2011-11-28: qty 1

## 2011-11-28 MED ORDER — DIPHENHYDRAMINE HCL 50 MG/ML IJ SOLN: 25.0000 mg | Freq: Once | INTRAMUSCULAR | Status: DC

## 2011-11-28 MED ORDER — DIPHENHYDRAMINE HCL 50 MG/ML IJ SOLN
INTRAMUSCULAR | Status: AC
  Filled 2011-11-28: qty 1

## 2011-11-28 NOTE — ED Notes (Signed)
Pt to Xray.

## 2011-11-28 NOTE — ED Notes (Signed)
PT. REPORTS HYPERGLYCEMIA AT HOME THIS EVENING CBG= 495 , TOOK HUMALOG 30 UNITS AND LANTUS 30 UNITS PRIOR TO ARRIVAL , ALSO REPORTS VOMITTED 3 TIMES THIS EVENING .

## 2011-11-28 NOTE — ED Provider Notes (Signed)
History     CSN: [id number]  Arrival date & time 11/28/11  2135   First MD Initiated Contact with Patient 11/28/11 2158      Chief Complaint  Patient presents with  . Hyperglycemia    (Consider location/radiation/quality/duration/timing/severity/associated sxs/prior treatment) The history is provided by the patient.   patient presents to the ER of hyperglycemia and vomiting. She states her sugars were 495 at home. She took 30 units of Humalog and 30 units of Lantus prior to arrival. She also states she's vomited 3 times. She some mild abdominal pain. She states that she feels weak all over. She's been urinating frequently. No diarrhea. Mild upper abdominal pain. No chest pain. No dysuria. She was recently started back on Enbrel and methotrexate for her rheumatoid arthritis. No cough. No chest pain. Patient states that she's had cramps in her right hand.  Past Medical History  Diagnosis Date  . Diabetes mellitus   . Arthritis   . Heart murmur     Past Surgical History  Procedure Date  . Cesarean section     No family history on file.  History  Substance Use Topics  . Smoking status: Never Smoker   . Smokeless tobacco: Not on file  . Alcohol Use: No    OB History    Grav Para Term Preterm Abortions TAB SAB Ect Mult Living                  Review of Systems  Constitutional: Positive for fatigue. Negative for activity change and appetite change.  HENT: Negative for neck stiffness.   Eyes: Negative for pain.  Respiratory: Negative for cough, chest tightness and shortness of breath.   Cardiovascular: Negative for chest pain and leg swelling.  Gastrointestinal: Positive for nausea, vomiting and abdominal pain. Negative for diarrhea.  Genitourinary: Positive for frequency. Negative for dysuria and flank pain.  Musculoskeletal: Negative for back pain.  Skin: Negative for rash.  Neurological: Negative for weakness, numbness and headaches.  Psychiatric/Behavioral: Negative  for behavioral problems.    Allergies  Review of patient's allergies indicates no known allergies.  Home Medications   Current Outpatient Rx  Name Route Sig Dispense Refill  . AMITRIPTYLINE HCL 10 MG PO TABS Oral Take 10 mg by mouth at bedtime.    Marland Kitchen ETANERCEPT 50 MG/ML [place] SOLN Subcutaneous Inject 50 mg into the skin once a week.    . OMEGA-3 FATTY ACIDS 1000 MG PO CAPS Oral Take 1 g by mouth daily.    Marland Kitchen FOLIC ACID 1 MG PO TABS Oral Take 1 mg by mouth daily.    . IBUPROFEN 800 MG PO TABS Oral Take 800 mg by mouth every 8 (eight) hours as needed. For pain    . INSULIN GLARGINE 100 UNIT/ML [place] SOLN Subcutaneous Inject 30 Units into the skin at bedtime.    . INSULIN LISPRO (HUMAN) 100 UNIT/ML [place] SOLN Subcutaneous Inject 12-18 Units into the skin 3 (three) times daily before meals. Based on sliding scale    . METHOTREXATE 2.5 MG PO TABS Oral Take 10 mg by mouth once a week. As needed Caution:Chemotherapy. Protect from light.    . SENNA 8.6 MG PO TABS Oral Take 1 tablet by mouth daily.      BP 114/75  Pulse 113  Temp(Src) 98.2 F (36.8 C) (Oral)  Resp 16  SpO2 100%  LMP 11/13/2011  Physical Exam  Nursing note and vitals reviewed. Constitutional: She is oriented to person, place, and time.  She appears well-developed and well-nourished.  HENT:  Head: Normocephalic and atraumatic.  Eyes: EOM are normal. Pupils are equal, round, and reactive to light.  Neck: Normal range of motion. Neck supple.  Cardiovascular: Regular rhythm and normal heart sounds.   No murmur heard.      Tachycardia  Pulmonary/Chest: Effort normal and breath sounds normal. No respiratory distress. She has no wheezes. She has no rales.  Abdominal: Soft. Bowel sounds are normal. She exhibits no distension. There is no tenderness. There is no rebound and no guarding.  Musculoskeletal: Normal range of motion.  Neurological: She is alert and oriented to person, place, and time. No cranial nerve deficit.  Skin: Skin is  warm and dry.  Psychiatric: She has a normal mood and affect. Her speech is normal.    ED Course  Procedures (including critical care time)  Labs Reviewed  DIFFERENTIAL - Abnormal; Notable for the following:    Neutrophils Relative 80 (*)    All other components within normal limits  BASIC METABOLIC PANEL - Abnormal; Notable for the following:    Sodium 131 (*)    Chloride 89 (*)    Glucose, Bld 414 (*)    All other components within normal limits  GLUCOSE, CAPILLARY - Abnormal; Notable for the following:    Glucose-Capillary 418 (*)    All other components within normal limits  HEPATIC FUNCTION PANEL - Abnormal; Notable for the following:    Total Protein 8.5 (*)    All other components within normal limits  GLUCOSE, CAPILLARY - Abnormal; Notable for the following:    Glucose-Capillary 173 (*)    All other components within normal limits  CBC  LIPASE, BLOOD  URINALYSIS, ROUTINE W REFLEX MICROSCOPIC   Dg Chest 2 View  11/28/2011  *RADIOLOGY REPORT*  Clinical Data: 41 year old female with hyperglycemia.  CHEST - 2 VIEW  Comparison: 11/05/2009 and earlier.  Findings: Normal lung volumes. Normal cardiac size and mediastinal contours.  Visualized tracheal air column is within normal limits. The lungs are clear.  No pneumothorax or effusion. No acute osseous abnormality identified.  IMPRESSION: Negative, no acute cardiopulmonary abnormality.  Original Report Authenticated By: Harley Hallmark, M.D.     1. Hyperglycemia   2. Nausea and vomiting       MDM  Patient is diabetic and has hyperglycemia home. She's also had nausea vomiting. Her initial blood sugar was 450. This improved down to 170 at this point. She's had IV fluids and IV insulin. She also taken her on insulin at home. She's not eating she tolerated orals her sugar remained stable she'll be discharged home.        Juliet Rude. Rubin Payor, MD 11/28/11 2356

## 2011-11-29 LAB — GLUCOSE, CAPILLARY
Glucose-Capillary: 101 mg/dL — ABNORMAL HIGH (ref 70–99)
Glucose-Capillary: 172 mg/dL — ABNORMAL HIGH (ref 70–99)

## 2011-11-29 LAB — URINALYSIS, ROUTINE W REFLEX MICROSCOPIC
Bilirubin Urine: NEGATIVE
Glucose, UA: >1000 mg/dL — AB
Hgb urine dipstick: NEGATIVE
Ketones, ur: >80 mg/dL — AB
Leukocytes, UA: NEGATIVE
Nitrite: NEGATIVE
Protein, ur: NEGATIVE mg/dL
Specific Gravity, Urine: 1.037 — ABNORMAL HIGH (ref 1.005–1.030)
Urobilinogen, UA: 0.2 mg/dL (ref 0.0–1.0)
pH: 5 (ref 5.0–8.0)

## 2011-11-29 LAB — URINE MICROSCOPIC-ADD ON

## 2011-11-29 MED ORDER — PROMETHAZINE HCL 25 MG RE SUPP: 12.5000 mg | Freq: Four times a day (QID) | RECTAL | Status: DC | PRN

## 2011-11-29 NOTE — ED Notes (Signed)
Redness and burning to R arm have diminished.  Pt states she no longer feels she needs benadryl.

## 2011-11-29 NOTE — ED Notes (Signed)
Pt states she feels burning to arm with morphine infusion.  2 mg infused.  Morphine stopped.  Dr Rubin Payor notified.

## 2011-11-29 NOTE — ED Notes (Signed)
PT finished snack.  Dr Nicanor Alcon notified.

## 2011-11-29 NOTE — ED Notes (Signed)
Pt states abd pain diminished, although arthritic pain to bil knees remains, however she would like to wait until she gets home to take her own medications.

## 2011-11-29 NOTE — ED Notes (Signed)
Dr Nicanor Alcon notified of cbg.  Dr Nicanor Alcon stated to re-check cbg in 30 minutes.

## 2011-11-29 NOTE — Discharge Instructions (Signed)
Diabetes, Frequently Asked Questions  WHAT IS DIABETES?  Most of the food we eat is turned into glucose (sugar). Our bodies use it for energy. The pancreas makes a hormone called insulin. It helps glucose get into the cells of our bodies. When you have diabetes, your body either does not make enough insulin or cannot use its own insulin as well as it should. This causes sugars to build up in your blood.  WHAT ARE THE SYMPTOMS OF DIABETES?   Frequent urination.   Excessive thirst.   Unexplained weight loss.   Extreme hunger.   Blurred vision.   Tingling or numbness in hands or feet.   Feeling very tired much of the time.   Dry, itchy skin.   Sores that are slow to heal.   Yeast infections.  WHAT ARE THE TYPES OF DIABETES?  Type 1 Diabetes    About 10% of affected people have this type.   Usually occurs before the age of 30.   Usually occurs in thin to normal weight people.  Type 2 Diabetes   About 90% of affected people have this type.   Usually occurs after the age of 40.   Usually occurs in overweight people.   More likely to have:   A family history of diabetes.   A history of diabetes during pregnancy (gestational diabetes).   High blood pressure.   High cholesterol and triglycerides.  Gestational Diabetes   Occurs in about 4% of pregnancies.   Usually goes away after the baby is born.   More likely to occur in women with:   Family history of diabetes.   Previous gestational diabetes.   Obese.   Over 25 years old.  WHAT IS PRE-DIABETES?  Pre-diabetes means your blood glucose is higher than normal, but lower than the diabetes range. It also means you are at risk of getting type 2 diabetes and heart disease. If you are told you have pre-diabetes, have your blood glucose checked again in 1 to 2 years.  WHAT IS THE TREATMENT FOR DIABETES?  Treatment is aimed at keeping blood glucose near normal levels at all times. Learning how to manage this yourself is important in treating diabetes.  Depending on the type of diabetes you have, your treatment will include one or more of the following:   Monitoring your blood glucose.   Meal planning.   Exercise.   Oral medicine (pills) or insulin.  CAN DIABETES BE PREVENTED?  With type 1 diabetes, prevention is more difficult, because the triggers that cause it are not yet known.  With type 2 diabetes, prevention is more likely, with lifestyle changes:   Maintain a healthy weight.   Eat healthy.   Exercise.  IS THERE A CURE FOR DIABETES?  No, there is no cure for diabetes. There is a lot of research going on that is looking for a cure, and progress is being made. Diabetes can be treated and controlled. People with diabetes can manage their diabetes and lead normal, active lives.  SHOULD I BE TESTED FOR DIABETES?  If you are at least 42 years old, you should be tested for diabetes. You should be tested again every 3 years. If you are 45 or older and overweight, you may want to get tested more often. If you are younger than 45, overweight, and have one or more of the following risk factors, you should be tested:   Family history of diabetes.   Inactive lifestyle.   High blood pressure.    WHAT ARE SOME OTHER SOURCES FOR INFORMATION ON DIABETES?  The following organizations may help in your search for more information on diabetes:  National Diabetes Education Program (NDEP)  Internet: http://www.ndep.nih.gov/resources  American Diabetes Association  Internet: http://www.diabetes.org   Juvenile Diabetes Foundation International  Internet: http://www.jdf.org  Document Released: 07/17/2003 Document Revised: 07/03/2011 Document Reviewed: 05/11/2009  [employer] Patient Information 2012 [employer].

## 2012-02-19 ENCOUNTER — Other Ambulatory Visit: Payer: Self-pay | Admitting: Physician Assistant

## 2012-03-16 ENCOUNTER — Other Ambulatory Visit: Payer: Self-pay | Admitting: Obstetrics and Gynecology

## 2012-05-25 ENCOUNTER — Other Ambulatory Visit: Payer: Self-pay | Admitting: Family Medicine

## 2012-05-25 ENCOUNTER — Ambulatory Visit
Admission: RE | Admit: 2012-05-25 | Discharge: 2012-05-25 | Disposition: A | Discharge status: Home / Self Care | Payer: BC Managed Care – PPO | Source: Ambulatory Visit | Attending: Family Medicine | Admitting: Family Medicine

## 2012-05-25 DIAGNOSIS — R1031 Right lower quadrant pain: Secondary | ICD-10-CM

## 2012-05-25 MED ORDER — IOHEXOL 300 MG/ML  SOLN
100.0000 mL | Freq: Once | INTRAMUSCULAR | Status: AC | PRN
  Administered 2012-05-25: 100 mL via INTRAVENOUS

## 2012-06-26 ENCOUNTER — Encounter (HOSPITAL_COMMUNITY): Payer: Self-pay | Admitting: Emergency Medicine

## 2012-06-26 ENCOUNTER — Emergency Department (HOSPITAL_COMMUNITY)
Admission: EM | Admit: 2012-06-26 | Discharge: 2012-06-26 | Disposition: A | Discharge status: Home / Self Care | Payer: BC Managed Care – PPO | Attending: Emergency Medicine | Admitting: Emergency Medicine

## 2012-06-26 ENCOUNTER — Emergency Department (HOSPITAL_COMMUNITY): Payer: BC Managed Care – PPO

## 2012-06-26 DIAGNOSIS — Z8679 Personal history of other diseases of the circulatory system: Secondary | ICD-10-CM | POA: Insufficient documentation

## 2012-06-26 DIAGNOSIS — Z794 Long term (current) use of insulin: Secondary | ICD-10-CM | POA: Insufficient documentation

## 2012-06-26 DIAGNOSIS — Z3202 Encounter for pregnancy test, result negative: Secondary | ICD-10-CM | POA: Insufficient documentation

## 2012-06-26 DIAGNOSIS — Z79899 Other long term (current) drug therapy: Secondary | ICD-10-CM | POA: Insufficient documentation

## 2012-06-26 DIAGNOSIS — E119 Type 2 diabetes mellitus without complications: Secondary | ICD-10-CM | POA: Insufficient documentation

## 2012-06-26 DIAGNOSIS — R52 Pain, unspecified: Secondary | ICD-10-CM | POA: Insufficient documentation

## 2012-06-26 DIAGNOSIS — M329 Systemic lupus erythematosus, unspecified: Secondary | ICD-10-CM | POA: Insufficient documentation

## 2012-06-26 DIAGNOSIS — G8929 Other chronic pain: Secondary | ICD-10-CM | POA: Insufficient documentation

## 2012-06-26 DIAGNOSIS — K5900 Constipation, unspecified: Secondary | ICD-10-CM

## 2012-06-26 DIAGNOSIS — Z8739 Personal history of other diseases of the musculoskeletal system and connective tissue: Secondary | ICD-10-CM | POA: Insufficient documentation

## 2012-06-26 HISTORY — DX: Systemic lupus erythematosus, unspecified: M32.9

## 2012-06-26 HISTORY — DX: Reserved for concepts with insufficient information to code with codable children: IMO0002

## 2012-06-26 LAB — CBC WITH DIFFERENTIAL/PLATELET
Basophils Absolute: 0 10*3/uL (ref 0.0–0.1)
Basophils Relative: 0 % (ref 0–1)
Eosinophils Absolute: 0 10*3/uL (ref 0.0–0.7)
Eosinophils Relative: 1 % (ref 0–5)
HCT: 38.4 % (ref 36.0–46.0)
Hemoglobin: 12.6 g/dL (ref 12.0–15.0)
Lymphocytes Relative: 28 % (ref 12–46)
Lymphs Abs: 1.7 10*3/uL (ref 0.7–4.0)
MCH: 28.2 pg (ref 26.0–34.0)
MCHC: 32.8 g/dL (ref 30.0–36.0)
MCV: 85.9 fL (ref 78.0–100.0)
Monocytes Absolute: 0.5 10*3/uL (ref 0.1–1.0)
Monocytes Relative: 9 % (ref 3–12)
Neutro Abs: 3.8 10*3/uL (ref 1.7–7.7)
Neutrophils Relative %: 63 % (ref 43–77)
Platelets: 240 10*3/uL (ref 150–400)
RBC: 4.47 MIL/uL (ref 3.87–5.11)
RDW: 12.8 % (ref 11.5–15.5)
WBC: 6.1 10*3/uL (ref 4.0–10.5)

## 2012-06-26 LAB — PREGNANCY, URINE: Preg Test, Ur: NEGATIVE

## 2012-06-26 LAB — URINALYSIS, ROUTINE W REFLEX MICROSCOPIC
Bilirubin Urine: NEGATIVE
Glucose, UA: 500 mg/dL — AB
Hgb urine dipstick: NEGATIVE
Ketones, ur: 15 mg/dL — AB
Nitrite: NEGATIVE
Protein, ur: NEGATIVE mg/dL
Specific Gravity, Urine: 1.014 (ref 1.005–1.030)
Urobilinogen, UA: 0.2 mg/dL (ref 0.0–1.0)
pH: 7 (ref 5.0–8.0)

## 2012-06-26 LAB — URINE MICROSCOPIC-ADD ON

## 2012-06-26 LAB — BASIC METABOLIC PANEL
BUN: 10 mg/dL (ref 6–23)
CO2: 26 mEq/L (ref 19–32)
Calcium: 9.3 mg/dL (ref 8.4–10.5)
Chloride: 98 mEq/L (ref 96–112)
Creatinine, Ser: 0.59 mg/dL (ref 0.50–1.10)
GFR calc Af Amer: >90 mL/min (ref >90)
GFR calc non Af Amer: >90 mL/min (ref >90)
Glucose, Bld: 249 mg/dL — ABNORMAL HIGH (ref 70–99)
Potassium: 4.3 mEq/L (ref 3.5–5.1)
Sodium: 133 mEq/L — ABNORMAL LOW (ref 135–145)

## 2012-06-26 MED ORDER — POLYETHYLENE GLYCOL 3350 17 GM/SCOOP PO POWD: 17.0000 g | Freq: Every day | ORAL | Status: DC

## 2012-06-26 NOTE — ED Provider Notes (Signed)
History     CSN: [id number]  Arrival date & time 06/26/12  1038   First MD Initiated Contact with Patient 06/26/12 1045      Chief Complaint  Patient presents with  . Abdominal Pain    (Consider location/radiation/quality/duration/timing/severity/associated sxs/prior treatment) HPI  41 year old female with history of lupus presents complaining of abdominal pain. Patient reports for the past 3 months she has had intermittent pain to her low abdomen. For the past month her pain has intensified. She described as a gradual onset of sharp pressure sensation her low abdomen radiates to her low back pain is worsened with movement or palpation, nothing improves it.  Not had a bowel movement in 4 days but able to pass flatus.  Has tried Fleet enema last night without relief.  Pt reports she has been evaluated for this problem in ER 1 month ago and was diagnosed with constipation. She was given stool softener and have been on soft liquid food however, pain has improved for a period of time only but now worsen.  Denies fever, chills, n/v/d, cp, sob, or dysuria.  LMP Nov 9th.    Past Medical History  Diagnosis Date  . Diabetes mellitus   . Arthritis   . Heart murmur   . Lupus     Past Surgical History  Procedure Date  . Cesarean section     No family history on file.  History  Substance Use Topics  . Smoking status: Never Smoker   . Smokeless tobacco: Not on file  . Alcohol Use: No    OB History    Grav Para Term Preterm Abortions TAB SAB Ect Mult Living                  Review of Systems  All other systems reviewed and are negative.    Allergies  Morphine and related  Home Medications   Current Outpatient Rx  Name  Route  Sig  Dispense  Refill  . AMITRIPTYLINE HCL 10 MG PO TABS   Oral   Take 10 mg by mouth at bedtime.         Marland Kitchen ETANERCEPT 50 MG/ML [place] SOLN   Subcutaneous   Inject 50 mg into the skin once a week.         . OMEGA-3 FATTY ACIDS 1000 MG PO  CAPS   Oral   Take 1 g by mouth daily.         Marland Kitchen FOLIC ACID 1 MG PO TABS   Oral   Take 1 mg by mouth daily.         . IBUPROFEN 800 MG PO TABS   Oral   Take 800 mg by mouth every 8 (eight) hours as needed. For pain         . INSULIN GLARGINE 100 UNIT/ML [place] SOLN   Subcutaneous   Inject 30 Units into the skin at bedtime.         . INSULIN LISPRO (HUMAN) 100 UNIT/ML [place] SOLN   Subcutaneous   Inject 12-18 Units into the skin 3 (three) times daily before meals. Based on sliding scale         . METHOTREXATE 2.5 MG PO TABS   Oral   Take 10 mg by mouth once a week. As needed Caution:Chemotherapy. Protect from light.         Marland Kitchen PROMETHAZINE HCL 25 MG RE SUPP   Rectal   Place 0.5 suppositories (12.5 mg total) rectally every 6 (  six) hours as needed for nausea.   4 each   0   . SENNA 8.6 MG PO TABS   Oral   Take 1 tablet by mouth daily.           BP 133/88  Pulse 99  Temp 98.3 F (36.8 C) (Oral)  Resp 16  SpO2 100%  LMP 06/05/2012  Physical Exam  Nursing note and vitals reviewed. Constitutional: She is oriented to person, place, and time. She appears well-developed and well-nourished. No distress.       Awake, alert, nontoxic appearance  HENT:  Head: Atraumatic.  Eyes: Conjunctivae normal are normal. Right eye exhibits no discharge. Left eye exhibits no discharge.  Neck: Neck supple.  Cardiovascular: Normal rate and regular rhythm.   Pulmonary/Chest: Effort normal. No respiratory distress. She exhibits no tenderness.  Abdominal: Soft. She exhibits distension. There is tenderness. There is rebound and guarding.       +CVA tenderness bilaterally  Genitourinary: Guaiac negative stool.       Chaperone present:  No mass or distal impaction appreciated  Musculoskeletal: Normal range of motion. She exhibits no edema and no tenderness.       ROM appears intact, no obvious focal weakness  Neurological: She is alert and oriented to person, place, and time.        Mental status and motor strength appears intact  Skin: No rash noted.  Psychiatric: She has a normal mood and affect.    ED Course  Procedures (including critical care time)   Labs Reviewed  CBC WITH DIFFERENTIAL  BASIC METABOLIC PANEL  URINALYSIS, ROUTINE W REFLEX MICROSCOPIC  PREGNANCY, URINE   No results found.   No diagnosis found.  1. constipation  MDM  Pt presents with acute on chronic abd pain.  LBM 4 days ago.  On exam pt with mildly distended abdomen, which is tender even to slight touch. No focal point tenderness. No hernia noted.  She is afebrile and nontoxic.    12:30 PM Acute abdominal series shows evidence of constipation but without evidence of obstruction, or free air.  Pt admits to taking vicodin on a regular basis.  I anticipate narcotic pain meds may play a role in her constipation.  I recommend avoid narcotic use.  I do not think pt is warranted an abd CT scan at this time as she is afebrile, no n/v, normal WBC and does not appears toxic, AND her sxs has been chronic.  Therefore, i recommend continue with her stool softeners, and to f/u with her specialist at [facility] for further care.  Strict return precaution discussed.  Pt voice understanding and agrees with plan.  Pt stable for discharge.  Able to tolerates PO.      Fayrene Helper, PA-C 06/26/12 1301

## 2012-06-26 NOTE — ED Notes (Signed)
PT ambulated with baseline gait; VSS; A&Ox3; no signs of distress; respirations even and unlabored; skin warm and dry; no questions upon discharge.  

## 2012-06-26 NOTE — ED Provider Notes (Signed)
Medical screening examination/treatment/procedure(s) were performed by non-physician practitioner and as supervising physician I was immediately available for consultation/collaboration.   Dione Booze, MD 06/26/12 1302

## 2012-06-26 NOTE — ED Notes (Signed)
PT reports she has not had BM for 4 days. Pt said she was here few days ago and tried fleets enema but never got better.

## 2012-06-26 NOTE — ED Notes (Addendum)
Pt denies nausea, vomiting,diarrh.

## 2012-10-10 ENCOUNTER — Encounter (HOSPITAL_COMMUNITY): Payer: Self-pay | Admitting: Emergency Medicine

## 2012-10-10 ENCOUNTER — Emergency Department (HOSPITAL_COMMUNITY)
Admission: EM | Admit: 2012-10-10 | Discharge: 2012-10-10 | Disposition: A | Discharge status: Home / Self Care | Payer: BC Managed Care – PPO | Attending: Emergency Medicine | Admitting: Emergency Medicine

## 2012-10-10 DIAGNOSIS — Z79899 Other long term (current) drug therapy: Secondary | ICD-10-CM | POA: Insufficient documentation

## 2012-10-10 DIAGNOSIS — M129 Arthropathy, unspecified: Secondary | ICD-10-CM | POA: Insufficient documentation

## 2012-10-10 DIAGNOSIS — Z3202 Encounter for pregnancy test, result negative: Secondary | ICD-10-CM | POA: Insufficient documentation

## 2012-10-10 DIAGNOSIS — R011 Cardiac murmur, unspecified: Secondary | ICD-10-CM | POA: Insufficient documentation

## 2012-10-10 DIAGNOSIS — R42 Dizziness and giddiness: Secondary | ICD-10-CM | POA: Insufficient documentation

## 2012-10-10 DIAGNOSIS — E1169 Type 2 diabetes mellitus with other specified complication: Secondary | ICD-10-CM | POA: Insufficient documentation

## 2012-10-10 DIAGNOSIS — Z8739 Personal history of other diseases of the musculoskeletal system and connective tissue: Secondary | ICD-10-CM | POA: Insufficient documentation

## 2012-10-10 DIAGNOSIS — E162 Hypoglycemia, unspecified: Secondary | ICD-10-CM

## 2012-10-10 DIAGNOSIS — Z794 Long term (current) use of insulin: Secondary | ICD-10-CM | POA: Insufficient documentation

## 2012-10-10 DIAGNOSIS — F411 Generalized anxiety disorder: Secondary | ICD-10-CM | POA: Insufficient documentation

## 2012-10-10 LAB — GLUCOSE, CAPILLARY
Glucose-Capillary: 120 mg/dL — ABNORMAL HIGH (ref 70–99)
Glucose-Capillary: 172 mg/dL — ABNORMAL HIGH (ref 70–99)
Glucose-Capillary: 228 mg/dL — ABNORMAL HIGH (ref 70–99)

## 2012-10-10 LAB — PREGNANCY, URINE: Preg Test, Ur: NEGATIVE

## 2012-10-10 MED ORDER — DEXTROSE-NACL 5-0.45 % IV SOLN
INTRAVENOUS | Status: DC
  Administered 2012-10-10: 20:00:00 via INTRAVENOUS

## 2012-10-10 MED ORDER — DEXTROSE-NACL 5-0.9 % IV SOLN: Freq: Once | INTRAVENOUS | Status: DC

## 2012-10-10 NOTE — ED Notes (Signed)
Patient from home with hypoglycemic event.  Per EMS patient was hypoglycemic with unknown blood sugar, patient's family gave patient 30 units of insulin.  When EMS arrived patient's CBG was 62.  EMS gave 25 of D10 and hung a 250 ml bag of D10 and patient's sugar improved to 178.   Patient's sugar is 172 at this time.  Patient is alert and oriented x4 and stable at this time.

## 2012-10-10 NOTE — ED Notes (Signed)
Pt took CBG before bed (119 mg/dl) and ate/took 20 units of Lantus; she talked to friend approximately 1 am and when friend found her at 5:10 pm she was passed out on floor; friend gave her more insulin and called 911.

## 2012-10-10 NOTE — ED Notes (Signed)
Patient recently diagnosed with Lupus and has not gotten on any new meds.  Patient's PCP changed insulin from Humalog to Areba and reports having hypoglycemic episodes every since.

## 2012-10-10 NOTE — ED Provider Notes (Signed)
History     CSN: [id number]  Arrival date & time 10/10/12  4098   First MD Initiated Contact with Patient 10/10/12 1842      Chief Complaint  Patient presents with  . Hypoglycemia    (Consider location/radiation/quality/duration/timing/severity/associated sxs/prior treatment) HPI Comments: Patient is a 42 y/o F with PMHx of DM, arthritis, lupus presenting with hypoglycemia to th ED. Friend of the patient reported that she found patient in bed around 5:00pm today (10/10/2012) unresponsive, but trying to get out of it - patient's friend got nervous and scared, called the patient's daughter who stated to give the patient insulin. Patient's friend administered 30 Units of insulin approximately around 5:10pm - according to patient's friend. Patient's friend called EMS. Upon arrival, EMs measured CBG to be 62, EMS administered 25 of D10 and hung a 250 mL bag of D10 - patient's CBG increased to 178. Upon arrival to ED, patient's CBG was 172. Patient reported feeling well yesterday (10/09/2012), reported that her CBG was 119 before she went to bed last night. Patient reported that she had a hypoglycemic event on Tuesday (10/05/2012) where glucagon kit was used. Patient denied dizziness, headache, tingling, visual disturbances, abdominal pain.   Current DM regimen: Lantus 100 Unit/mL injection 19 Units every morning and every night before bed and Apidra every morning and every night - according to patient.   The history is provided by the patient.    Past Medical History  Diagnosis Date  . Diabetes mellitus   . Arthritis   . Heart murmur   . Lupus     Past Surgical History  Procedure Laterality Date  . Cesarean section      No family history on file.  History  Substance Use Topics  . Smoking status: Never Smoker   . Smokeless tobacco: Not on file  . Alcohol Use: No    OB History   Grav Para Term Preterm Abortions TAB SAB Ect Mult Living                  Review of Systems   Constitutional: Negative for fever.  Eyes: Negative for visual disturbance.  Respiratory: Negative for shortness of breath.   Cardiovascular: Negative for chest pain.  Gastrointestinal: Negative for nausea and abdominal pain.  Neurological: Positive for dizziness. Negative for numbness and headaches.  Psychiatric/Behavioral: The patient is nervous/anxious.     Allergies  Morphine and related and Tramadol  Home Medications   Current Outpatient Rx  Name  Route  Sig  Dispense  Refill  . atorvastatin (LIPITOR) 40 MG tablet   Oral   Take 40 mg by mouth daily.         . Bisacodyl (DULCOLAX PO)   Oral   Take 1 tablet by mouth daily as needed (for constipation). Usually once a week on Sundays         . hydroxychloroquine (PLAQUENIL) 200 MG tablet   Oral   Take 200 mg by mouth daily.         Marland Kitchen ibuprofen (ADVIL,MOTRIN) 800 MG tablet   Oral   Take 800 mg by mouth every 8 (eight) hours as needed. For pain         . insulin glargine (LANTUS) 100 UNIT/ML injection   Subcutaneous   Inject 19 Units into the skin 2 (two) times daily.          . methotrexate 25 MG/ML SOLN   Subcutaneous   Inject 25 mg into the skin once a  week. On Sundays         . fish oil-omega-3 fatty acids 1000 MG capsule   Oral   Take 1 g by mouth daily.           BP 141/92  Pulse 97  Temp(Src) 98.4 F (36.9 C)  Resp 18  SpO2 100%  Physical Exam  Nursing note and vitals reviewed. Constitutional: She appears well-developed and well-nourished. No distress.  Eyes: Conjunctivae and EOM are normal. Pupils are equal, round, and reactive to light. Right eye exhibits no discharge. Left eye exhibits no discharge.  Neck: Normal range of motion. Neck supple.  Cardiovascular: Normal rate, regular rhythm, normal heart sounds and intact distal pulses.   Pulmonary/Chest: Effort normal and breath sounds normal. She has no wheezes. She has no rales.  Abdominal: Soft. Bowel sounds are normal. She exhibits  no distension. There is no tenderness.  Neurological: She is alert.  Sensation intact to upper and lower extremities to soft touch.   Skin: Skin is warm and dry. No rash noted. She is not diaphoretic. No erythema.  Psychiatric:  Patient very anxious and overwhelmed by event.     ED Course  Procedures (including critical care time)  Filed Vitals:   10/10/12 2108  BP: 141/92  Pulse: 97  Temp:   Resp: 18    Labs Reviewed  GLUCOSE, CAPILLARY - Abnormal; Notable for the following:    Glucose-Capillary 172 (*)    All other components within normal limits  GLUCOSE, CAPILLARY - Abnormal; Notable for the following:    Glucose-Capillary 120 (*)    All other components within normal limits  GLUCOSE, CAPILLARY - Abnormal; Notable for the following:    Glucose-Capillary 228 (*)    All other components within normal limits  PREGNANCY, URINE   Results for orders placed during the hospital encounter of 10/10/12  GLUCOSE, CAPILLARY      Result Value Range   Glucose-Capillary 172 (*) 70 - 99 mg/dL   Comment 1 Notify RN     Comment 2 Documented in Chart    PREGNANCY, URINE      Result Value Range   Preg Test, Ur NEGATIVE  NEGATIVE  GLUCOSE, CAPILLARY      Result Value Range   Glucose-Capillary 120 (*) 70 - 99 mg/dL  GLUCOSE, CAPILLARY      Result Value Range   Glucose-Capillary 228 (*) 70 - 99 mg/dL   Filed Vitals:   52/84/13 2108  BP: 141/92  Pulse: 97  Temp:   Resp: 18     1. Hypoglycemia       MDM  Patient is a 42 y/o F presenting to the ED with hypoglycemia. Patient denied headache, numbness, paresthesias, changes to vision, chest pain, SOB, difficulty breathing.  I personally evaluated and examined patient.  Upon initial evaluation patient appeared anxious and overwhelmed with situation, was upset and crying.  PE: Lungs CTA b/l. Cardiac regular rate, rhythm, S1/S2 heard. Eyes PERRLA, EOMs intact. Abdomen non-distended, BS normoactive, soft, non-tender. Neuro  sensation intact to upper and lower extremities with soft touch.   Ordered pregnancy test to r/o pregnancy  Ordered and placed IV saline lock on right forearm Ordered D5NS flow of 144mL/h Ordered CBG monitoring every 1h to achieve glycemic control  Ordered cardiac monitoring for monitoring hypoglycemic shock   Pregnancy test negative.  12:27 AM Patient revaluated and appears to be doing well, shaking has decreased. Patient appears to be more calm and in better spirits.  12:27 AM CBG 228 - discontinued D5NS.  Patient afebrile, normotensive, non-tachycardic, happy affect. Blood glucose has improved, upon discharge CBG was 228. Patient is alert, feeling better, and has calmed down. Discussed with patient the need to follow-up with Dr. Sharl Ma within the next 24-48 hours to discuss visit to the ED and to discuss insulin regimen. Discharged patient with information regarding diabetic diet. Recommended patient to monitor blood sugars at home with a diary, taking into consideration when medication was administered and when/what she ate. Discussed with patient to continue home medications as prescribed. Discussed with patient that if symptoms are to worsen (headache, dizziness, numbness, changes in vision) to report back to the ED.   Patient agreed to plan of action, understood, all questions answered.           Raymon Mutton, PA-C 10/11/12 [phone]

## 2012-10-10 NOTE — ED Provider Notes (Signed)
Medical screening examination/treatment/procedure(s) were conducted as a shared visit with non-physician practitioner(s) and myself.  I personally evaluated the patient during the encounter.  Patient is a known diabetic found unresponsive, family assumed her blood sugar was high as regular insulin, EMS was notified and it did not help, EMS found the patient hypoglycemic and the patient woke up after receiving dextrose, the patient is now awake and alert with no headache no chest pain no shortness of breath no focal neurologic symptoms no change in speech or vision swallowing or understanding or focal or lateralizing weakness or numbness, she just feels generally shaky all over.  Back to baseline in ED.  Hurman Horn, MD 10/11/12 [phone]

## 2013-01-06 ENCOUNTER — Emergency Department (HOSPITAL_COMMUNITY)
Admission: EM | Admit: 2013-01-06 | Discharge: 2013-01-07 | Disposition: A | Discharge status: Home / Self Care | Payer: BC Managed Care – PPO | Attending: Emergency Medicine | Admitting: Emergency Medicine

## 2013-01-06 ENCOUNTER — Encounter (HOSPITAL_COMMUNITY): Payer: Self-pay | Admitting: Emergency Medicine

## 2013-01-06 ENCOUNTER — Emergency Department (HOSPITAL_COMMUNITY): Payer: BC Managed Care – PPO

## 2013-01-06 DIAGNOSIS — R011 Cardiac murmur, unspecified: Secondary | ICD-10-CM | POA: Insufficient documentation

## 2013-01-06 DIAGNOSIS — M129 Arthropathy, unspecified: Secondary | ICD-10-CM | POA: Insufficient documentation

## 2013-01-06 DIAGNOSIS — Z794 Long term (current) use of insulin: Secondary | ICD-10-CM | POA: Insufficient documentation

## 2013-01-06 DIAGNOSIS — E119 Type 2 diabetes mellitus without complications: Secondary | ICD-10-CM | POA: Insufficient documentation

## 2013-01-06 DIAGNOSIS — K5900 Constipation, unspecified: Secondary | ICD-10-CM | POA: Insufficient documentation

## 2013-01-06 DIAGNOSIS — M545 Low back pain, unspecified: Secondary | ICD-10-CM | POA: Insufficient documentation

## 2013-01-06 DIAGNOSIS — Z79899 Other long term (current) drug therapy: Secondary | ICD-10-CM | POA: Insufficient documentation

## 2013-01-06 DIAGNOSIS — R109 Unspecified abdominal pain: Secondary | ICD-10-CM | POA: Insufficient documentation

## 2013-01-06 DIAGNOSIS — Z3202 Encounter for pregnancy test, result negative: Secondary | ICD-10-CM | POA: Insufficient documentation

## 2013-01-06 DIAGNOSIS — M329 Systemic lupus erythematosus, unspecified: Secondary | ICD-10-CM | POA: Insufficient documentation

## 2013-01-06 DIAGNOSIS — Z885 Allergy status to narcotic agent status: Secondary | ICD-10-CM | POA: Insufficient documentation

## 2013-01-06 HISTORY — DX: Systemic involvement of connective tissue, unspecified: M35.9

## 2013-01-06 LAB — CBC WITH DIFFERENTIAL/PLATELET
Basophils Absolute: 0 10*3/uL (ref 0.0–0.1)
Basophils Relative: 0 % (ref 0–1)
Eosinophils Absolute: 0 10*3/uL (ref 0.0–0.7)
Eosinophils Relative: 0 % (ref 0–5)
HCT: 37.3 % (ref 36.0–46.0)
Hemoglobin: 12.5 g/dL (ref 12.0–15.0)
Lymphocytes Relative: 24 % (ref 12–46)
Lymphs Abs: 2.5 10*3/uL (ref 0.7–4.0)
MCH: 29.4 pg (ref 26.0–34.0)
MCHC: 33.5 g/dL (ref 30.0–36.0)
MCV: 87.8 fL (ref 78.0–100.0)
Monocytes Absolute: 0.7 10*3/uL (ref 0.1–1.0)
Monocytes Relative: 7 % (ref 3–12)
Neutro Abs: 7.2 10*3/uL (ref 1.7–7.7)
Neutrophils Relative %: 69 % (ref 43–77)
Platelets: 363 10*3/uL (ref 150–400)
RBC: 4.25 MIL/uL (ref 3.87–5.11)
RDW: 13.4 % (ref 11.5–15.5)
WBC: 10.4 10*3/uL (ref 4.0–10.5)

## 2013-01-06 LAB — COMPREHENSIVE METABOLIC PANEL
ALT: 14 U/L (ref 0–35)
AST: 20 U/L (ref 0–37)
Albumin: 3.5 g/dL (ref 3.5–5.2)
Alkaline Phosphatase: 87 U/L (ref 39–117)
BUN: 22 mg/dL (ref 6–23)
CO2: 25 mEq/L (ref 19–32)
Calcium: 9.6 mg/dL (ref 8.4–10.5)
Chloride: 98 mEq/L (ref 96–112)
Creatinine, Ser: 0.53 mg/dL (ref 0.50–1.10)
GFR calc Af Amer: >90 mL/min (ref >90)
GFR calc non Af Amer: >90 mL/min (ref >90)
Glucose, Bld: 272 mg/dL — ABNORMAL HIGH (ref 70–99)
Potassium: 4.3 mEq/L (ref 3.5–5.1)
Sodium: 133 mEq/L — ABNORMAL LOW (ref 135–145)
Total Bilirubin: 0.2 mg/dL — ABNORMAL LOW (ref 0.3–1.2)
Total Protein: 7.6 g/dL (ref 6.0–8.3)

## 2013-01-06 LAB — POCT PREGNANCY, URINE: Preg Test, Ur: NEGATIVE

## 2013-01-06 LAB — URINALYSIS, ROUTINE W REFLEX MICROSCOPIC
Bilirubin Urine: NEGATIVE
Glucose, UA: NEGATIVE mg/dL
Hgb urine dipstick: NEGATIVE
Ketones, ur: NEGATIVE mg/dL
Nitrite: NEGATIVE
Protein, ur: NEGATIVE mg/dL
Specific Gravity, Urine: 1.027 (ref 1.005–1.030)
Urobilinogen, UA: 0.2 mg/dL (ref 0.0–1.0)
pH: 5.5 (ref 5.0–8.0)

## 2013-01-06 LAB — URINE MICROSCOPIC-ADD ON

## 2013-01-06 MED ORDER — OXYCODONE-ACETAMINOPHEN 5-325 MG PO TABS
1.0000 | ORAL_TABLET | Freq: Once | ORAL | Status: AC
  Administered 2013-01-07: 1 via ORAL
  Filled 2013-01-06: qty 1

## 2013-01-06 NOTE — ED Provider Notes (Signed)
History     CSN: [id number]  Arrival date & time 01/06/13  2048   First MD Initiated Contact with Patient 01/06/13 2347      Chief Complaint  Patient presents with  . Constipation  . Back Pain    (Consider location/radiation/quality/duration/timing/severity/associated sxs/prior treatment) Patient is a 42 y.o. female presenting with constipation and back pain. The history is provided by the patient.  Constipation Associated symptoms: back pain   Back Pain She has a history of chronic constipation. She's not had a bowel movement for the last 4 days. Today, she developed severe pain across her lower abdomen and lower back. She rates the pain at 10/10 at its worst, and 8/10 currently. It is worse with standing and movement and better when she is laying still. There's also a transient relief with passing flatus. Denies nausea vomiting. She's had similar episodes in the past and, and has actually had a more severe episodes in the past. Her PCP has prescribed her something for irritable bowel syndrome with constipation but she did not get the medication filled yet because she has not had a chance to research it. Workup in the past has included CT scans.   Past Medical History  Diagnosis Date  . Diabetes mellitus   . Arthritis   . Heart murmur   . Lupus   . Connective tissue disease     Past Surgical History  Procedure Laterality Date  . Cesarean section      No family history on file.  History  Substance Use Topics  . Smoking status: Never Smoker   . Smokeless tobacco: Not on file  . Alcohol Use: No    OB History   Grav Para Term Preterm Abortions TAB SAB Ect Mult Living                  Review of Systems  Gastrointestinal: Positive for constipation.  Musculoskeletal: Positive for back pain.  All other systems reviewed and are negative.    Allergies  Morphine and related and Tramadol  Home Medications   Current Outpatient Rx  Name  Route  Sig  Dispense  Refill   . atorvastatin (LIPITOR) 40 MG tablet   Oral   Take 40 mg by mouth daily.         . Bisacodyl (DULCOLAX PO)   Oral   Take 2 tablets by mouth daily as needed (constipation).         . hydroxychloroquine (PLAQUENIL) 200 MG tablet   Oral   Take 200 mg by mouth daily.         . insulin glargine (LANTUS) 100 UNIT/ML injection   Subcutaneous   Inject 9 Units into the skin 2 (two) times daily.          . insulin glulisine (APIDRA) 100 UNIT/ML injection   Subcutaneous   Inject 15-30 Units into the skin 3 (three) times daily before meals. Sliding scale         . methotrexate 25 MG/ML SOLN   Subcutaneous   Inject 25 mg into the skin once a week. On Weds only         . SUMAtriptan (IMITREX) 25 MG tablet   Oral   Take 25 mg by mouth every 2 (two) hours as needed for migraine.           BP 155/87  Pulse 101  Temp(Src) 98.2 F (36.8 C) (Oral)  Resp 16  SpO2 99%  LMP 12/12/2012  Physical Exam  Nursing note and vitals reviewed.  42 year old female, who appears uncomfortable, but is in no acute distress. Vital signs are significant for borderline tachycardia with heart rate 101, and hypertension with blood pressure 155/87. Oxygen saturation is 99%, which is normal. Head is normocephalic and atraumatic. PERRLA, EOMI. Oropharynx is clear. Neck is nontender and supple without adenopathy or JVD. Back is nontender and there is no CVA tenderness. Lungs are clear without rales, wheezes, or rhonchi. Chest is nontender. Heart has regular rate and rhythm without murmur. Abdomen is soft, flat, with moderate tenderness across the lower abdomen. There is no rebound or guarding. There are no masses or hepatosplenomegaly and peristalsis is hypoactive. Extremities have no cyanosis or edema, full range of motion is present. Skin is warm and dry without rash. Neurologic: Mental status is normal, cranial nerves are intact, there are no motor or sensory deficits.  ED Course   Procedures (including critical care time)  Results for orders placed during the hospital encounter of 01/06/13  CBC WITH DIFFERENTIAL      Result Value Range   WBC 10.4  4.0 - 10.5 K/uL   RBC 4.25  3.87 - 5.11 MIL/uL   Hemoglobin 12.5  12.0 - 15.0 g/dL   HCT 16.1  09.6 - 04.5 %   MCV 87.8  78.0 - 100.0 fL   MCH 29.4  26.0 - 34.0 pg   MCHC 33.5  30.0 - 36.0 g/dL   RDW 40.9  81.1 - 91.4 %   Platelets 363  150 - 400 K/uL   Neutrophils Relative % 69  43 - 77 %   Neutro Abs 7.2  1.7 - 7.7 K/uL   Lymphocytes Relative 24  12 - 46 %   Lymphs Abs 2.5  0.7 - 4.0 K/uL   Monocytes Relative 7  3 - 12 %   Monocytes Absolute 0.7  0.1 - 1.0 K/uL   Eosinophils Relative 0  0 - 5 %   Eosinophils Absolute 0.0  0.0 - 0.7 K/uL   Basophils Relative 0  0 - 1 %   Basophils Absolute 0.0  0.0 - 0.1 K/uL  COMPREHENSIVE METABOLIC PANEL      Result Value Range   Sodium 133 (*) 135 - 145 mEq/L   Potassium 4.3  3.5 - 5.1 mEq/L   Chloride 98  96 - 112 mEq/L   CO2 25  19 - 32 mEq/L   Glucose, Bld 272 (*) 70 - 99 mg/dL   BUN 22  6 - 23 mg/dL   Creatinine, Ser 7.82  0.50 - 1.10 mg/dL   Calcium 9.6  8.4 - 95.6 mg/dL   Total Protein 7.6  6.0 - 8.3 g/dL   Albumin 3.5  3.5 - 5.2 g/dL   AST 20  0 - 37 U/L   ALT 14  0 - 35 U/L   Alkaline Phosphatase 87  39 - 117 U/L   Total Bilirubin 0.2 (*) 0.3 - 1.2 mg/dL   GFR calc non Af Amer >90  >90 mL/min   GFR calc Af Amer >90  >90 mL/min  URINALYSIS, ROUTINE W REFLEX MICROSCOPIC      Result Value Range   Color, Urine YELLOW  YELLOW   APPearance CLOUDY (*) CLEAR   Specific Gravity, Urine 1.027  1.005 - 1.030   pH 5.5  5.0 - 8.0   Glucose, UA NEGATIVE  NEGATIVE mg/dL   Hgb urine dipstick NEGATIVE  NEGATIVE   Bilirubin Urine NEGATIVE  NEGATIVE  Ketones, ur NEGATIVE  NEGATIVE mg/dL   Protein, ur NEGATIVE  NEGATIVE mg/dL   Urobilinogen, UA 0.2  0.0 - 1.0 mg/dL   Nitrite NEGATIVE  NEGATIVE   Leukocytes, UA SMALL (*) NEGATIVE  URINE MICROSCOPIC-ADD ON      Result  Value Range   Squamous Epithelial / LPF MANY (*) RARE   WBC, UA 21-50  <3 WBC/hpf   Bacteria, UA MANY (*) RARE  POCT PREGNANCY, URINE      Result Value Range   Preg Test, Ur NEGATIVE  NEGATIVE   Dg Abd 1 View  01/06/2013   *RADIOLOGY REPORT*  Clinical Data: Abdominal pain and constipation for 4 days.  ABDOMEN - 1 VIEW  Comparison: 06/26/2012  Findings: Diffusely stool and gas filled colon.  No small bowel distension.  No radiopaque stones.  Visualized bones appear intact. Vascular calcifications in the pelvis.  No significant change since previous study.  IMPRESSION: Diffusely stool filled colon.  No evidence of obstruction.   Original Report Authenticated By: Burman Nieves, M.D.      1. Abdominal pain   2. Constipation       MDM  Abdominal pain which seems to be due 2 constipation. Abdominal x-rays do show a large stool burden and WBC is normal. If you have old records shows a similar presentation in November at which time CT scan was unremarkable other than constipation. At that time, she was reported to have rebound and guarding. With normal WBC and a presentation which seems similar to prior episodes, I feel that she is safe for discharge without additional imaging. She'll be given a dose of Percocet for pain and once pain is adequately relieved, will be sent home with a prescription for GoLYTELY. Incidentally noted is hearing containing 21-50 WBCs and many bacteria. She also has many squamous epithelial cells indicating a contaminated specimen. She does not have clinical evidence of UTI so she will not be started on antibiotics.        Dione Booze, MD 01/07/13 0200

## 2013-01-06 NOTE — ED Notes (Signed)
PT. REPORTS CONSTIPATION FOR SEVERAL DAYS WITH LOW ABDOMINAL PAIN AND LOW BACK PAIN .

## 2013-01-07 MED ORDER — OXYCODONE-ACETAMINOPHEN 5-325 MG PO TABS: 1.0000 | ORAL_TABLET | ORAL | Status: DC | PRN

## 2013-01-07 MED ORDER — PEG 3350-KCL-NABCB-NACL-NASULF 236 G PO SOLR: 4.0000 L | Freq: Once | ORAL | Status: DC

## 2013-01-09 LAB — URINE CULTURE: Colony Count: >=100000

## 2013-01-10 ENCOUNTER — Telehealth (HOSPITAL_COMMUNITY): Payer: Self-pay | Admitting: Emergency Medicine

## 2013-01-10 NOTE — Progress Notes (Signed)
ED Antimicrobial Stewardship Positive Culture Follow Up   Tiffany [name] is an 42 y.o. female who presented to [facility] on 01/06/2013 with a chief complaint of abdominal pain and back pain   Chief Complaint  Patient presents with  . Constipation  . Back Pain    Recent Results (from the past 720 hour(s))  URINE CULTURE     Status: None   Collection Time    01/06/13  9:07 PM      Result Value Range Status   Specimen Description URINE, CLEAN CATCH   Final   Special Requests NONE   Final   Culture  Setup Time 01/07/2013 09:51   Final   Colony Count >=100,000 COLONIES/ML   Final   Culture ESCHERICHIA COLI   Final   Report Status 01/09/2013 FINAL   Final   Organism ID, Bacteria ESCHERICHIA COLI   Final    []  Treated with , organism resistant to prescribed antimicrobial [x]  Patient discharged originally without antimicrobial agent and treatment is now indicated  New antibiotic prescription: Macrobid 100 mg twice daily for 7 days  ED Provider: Junious Silk, PA-C  Rolley Sims 01/10/2013, 9:58 AM Infectious Diseases Pharmacist Phone# [phone]

## 2013-01-10 NOTE — ED Notes (Signed)
   Post ED Visit - Positive Culture Follow-up: Successful Patient Follow-Up  Culture assessed and recommendations reviewed by: [x]  Wes Dulaney, Pharm.D., BCPS []  Celedonio Miyamoto, Pharm.D., BCPS []  Georgina Pillion, Pharm.D., BCPS []  [place], [address].D., BCPS, AAHIVP []  Estella Husk, Pharm.D., BCPS, AAHIVP  Positive urine culture  [x]  Patient discharged without antimicrobial prescription and treatment is now indicated []  Organism is resistant to prescribed ED discharge antimicrobial []  Patient with positive blood cultures  Changes discussed with ED provider: [name] New antibiotic prescription Macrobid 100 mg twice daily x 7 days     [name] 01/10/2013, 1:40 PM

## 2013-01-10 NOTE — ED Notes (Signed)
Post ED Visit - Positive Culture Follow-up: Successful Patient Follow-Up  Culture assessed and recommendations reviewed by: []  Wes Dulaney, Pharm.D., BCPS []  Celedonio Miyamoto, Pharm.D., BCPS [x]  Georgina Pillion, Pharm.D., BCPS []  [place], [address].D., BCPS, AAHIVP []  Estella Husk, Pharm.D., BCPS, AAHIVP  Positive urine culture  [x]  Patient discharged without antimicrobial prescription and treatment is now indicated []  Organism is resistant to prescribed ED discharge antimicrobial []  Patient with positive blood cultures  Changes discussed with ED provider: Junious Silk New antibiotic prescription Macrobid 100 mg twice daily x 7 days.   Contacted patient:  No answer, date 01/10/2013 time 1458  [name] 01/10/2013, 2:58 PM

## 2013-01-12 ENCOUNTER — Telehealth (HOSPITAL_COMMUNITY): Payer: Self-pay | Admitting: Emergency Medicine

## 2013-02-07 ENCOUNTER — Emergency Department (HOSPITAL_COMMUNITY): Payer: BC Managed Care – PPO

## 2013-02-07 ENCOUNTER — Encounter (HOSPITAL_COMMUNITY): Payer: Self-pay | Admitting: *Deleted

## 2013-02-07 ENCOUNTER — Emergency Department (HOSPITAL_COMMUNITY)
Admission: EM | Admit: 2013-02-07 | Discharge: 2013-02-07 | Disposition: A | Discharge status: Home / Self Care | Payer: BC Managed Care – PPO | Attending: Emergency Medicine | Admitting: Emergency Medicine

## 2013-02-07 DIAGNOSIS — Z3202 Encounter for pregnancy test, result negative: Secondary | ICD-10-CM | POA: Insufficient documentation

## 2013-02-07 DIAGNOSIS — R197 Diarrhea, unspecified: Secondary | ICD-10-CM | POA: Insufficient documentation

## 2013-02-07 DIAGNOSIS — E119 Type 2 diabetes mellitus without complications: Secondary | ICD-10-CM | POA: Insufficient documentation

## 2013-02-07 DIAGNOSIS — M129 Arthropathy, unspecified: Secondary | ICD-10-CM | POA: Insufficient documentation

## 2013-02-07 DIAGNOSIS — M329 Systemic lupus erythematosus, unspecified: Secondary | ICD-10-CM | POA: Insufficient documentation

## 2013-02-07 DIAGNOSIS — Z79899 Other long term (current) drug therapy: Secondary | ICD-10-CM | POA: Insufficient documentation

## 2013-02-07 DIAGNOSIS — R011 Cardiac murmur, unspecified: Secondary | ICD-10-CM | POA: Insufficient documentation

## 2013-02-07 DIAGNOSIS — M545 Low back pain, unspecified: Secondary | ICD-10-CM | POA: Insufficient documentation

## 2013-02-07 DIAGNOSIS — M549 Dorsalgia, unspecified: Secondary | ICD-10-CM

## 2013-02-07 DIAGNOSIS — Z794 Long term (current) use of insulin: Secondary | ICD-10-CM | POA: Insufficient documentation

## 2013-02-07 DIAGNOSIS — M359 Systemic involvement of connective tissue, unspecified: Secondary | ICD-10-CM | POA: Insufficient documentation

## 2013-02-07 DIAGNOSIS — Z885 Allergy status to narcotic agent status: Secondary | ICD-10-CM | POA: Insufficient documentation

## 2013-02-07 DIAGNOSIS — R109 Unspecified abdominal pain: Secondary | ICD-10-CM | POA: Insufficient documentation

## 2013-02-07 LAB — GLUCOSE, CAPILLARY
Glucose-Capillary: 100 mg/dL — ABNORMAL HIGH (ref 70–99)
Glucose-Capillary: 151 mg/dL — ABNORMAL HIGH (ref 70–99)
Glucose-Capillary: 27 mg/dL — CL (ref 70–99)

## 2013-02-07 LAB — COMPREHENSIVE METABOLIC PANEL
ALT: 11 U/L (ref 0–35)
AST: 14 U/L (ref 0–37)
Albumin: 3.3 g/dL — ABNORMAL LOW (ref 3.5–5.2)
Alkaline Phosphatase: 77 U/L (ref 39–117)
BUN: 10 mg/dL (ref 6–23)
CO2: 28 mEq/L (ref 19–32)
Calcium: 9.3 mg/dL (ref 8.4–10.5)
Chloride: 102 mEq/L (ref 96–112)
Creatinine, Ser: 0.55 mg/dL (ref 0.50–1.10)
GFR calc Af Amer: >90 mL/min (ref >90)
GFR calc non Af Amer: >90 mL/min (ref >90)
Glucose, Bld: 99 mg/dL (ref 70–99)
Potassium: 3.7 mEq/L (ref 3.5–5.1)
Sodium: 138 mEq/L (ref 135–145)
Total Bilirubin: 0.2 mg/dL — ABNORMAL LOW (ref 0.3–1.2)
Total Protein: 7.4 g/dL (ref 6.0–8.3)

## 2013-02-07 LAB — CBC WITH DIFFERENTIAL/PLATELET
Basophils Absolute: 0 10*3/uL (ref 0.0–0.1)
Basophils Relative: 0 % (ref 0–1)
Eosinophils Absolute: 0 10*3/uL (ref 0.0–0.7)
Eosinophils Relative: 0 % (ref 0–5)
HCT: 37 % (ref 36.0–46.0)
Hemoglobin: 12.3 g/dL (ref 12.0–15.0)
Lymphocytes Relative: 28 % (ref 12–46)
Lymphs Abs: 2.1 10*3/uL (ref 0.7–4.0)
MCH: 28.1 pg (ref 26.0–34.0)
MCHC: 33.2 g/dL (ref 30.0–36.0)
MCV: 84.7 fL (ref 78.0–100.0)
Monocytes Absolute: 0.8 10*3/uL (ref 0.1–1.0)
Monocytes Relative: 11 % (ref 3–12)
Neutro Abs: 4.4 10*3/uL (ref 1.7–7.7)
Neutrophils Relative %: 60 % (ref 43–77)
Platelets: 321 10*3/uL (ref 150–400)
RBC: 4.37 MIL/uL (ref 3.87–5.11)
RDW: 12.5 % (ref 11.5–15.5)
WBC: 7.3 10*3/uL (ref 4.0–10.5)

## 2013-02-07 LAB — URINE MICROSCOPIC-ADD ON

## 2013-02-07 LAB — URINALYSIS, ROUTINE W REFLEX MICROSCOPIC
Bilirubin Urine: NEGATIVE
Glucose, UA: >1000 mg/dL — AB
Hgb urine dipstick: NEGATIVE
Ketones, ur: 40 mg/dL — AB
Leukocytes, UA: NEGATIVE
Nitrite: NEGATIVE
Protein, ur: NEGATIVE mg/dL
Specific Gravity, Urine: 1.017 (ref 1.005–1.030)
Urobilinogen, UA: 0.2 mg/dL (ref 0.0–1.0)
pH: 6 (ref 5.0–8.0)

## 2013-02-07 LAB — POCT PREGNANCY, URINE: Preg Test, Ur: NEGATIVE

## 2013-02-07 MED ORDER — DIPHENHYDRAMINE HCL 50 MG/ML IJ SOLN
25.0000 mg | Freq: Four times a day (QID) | INTRAMUSCULAR | Status: DC | PRN
  Administered 2013-02-07: 25 mg via INTRAVENOUS
  Filled 2013-02-07: qty 1

## 2013-02-07 MED ORDER — HYDROMORPHONE HCL PF 1 MG/ML IJ SOLN
1.0000 mg | Freq: Once | INTRAMUSCULAR | Status: AC
  Administered 2013-02-07: 1 mg via INTRAVENOUS
  Filled 2013-02-07: qty 1

## 2013-02-07 MED ORDER — DEXTROSE 50 % IV SOLN
INTRAVENOUS | Status: AC
  Administered 2013-02-07: 50 mL via INTRAVENOUS
  Filled 2013-02-07: qty 50

## 2013-02-07 MED ORDER — IOHEXOL 300 MG/ML  SOLN: 25.0000 mL | INTRAMUSCULAR | Status: AC

## 2013-02-07 MED ORDER — HYDROCODONE-ACETAMINOPHEN 5-325 MG PO TABS: 2.0000 | ORAL_TABLET | ORAL | Status: DC | PRN

## 2013-02-07 MED ORDER — SODIUM CHLORIDE 0.9 % IV BOLUS (SEPSIS)
1000.0000 mL | Freq: Once | INTRAVENOUS | Status: AC
  Administered 2013-02-07: 1000 mL via INTRAVENOUS

## 2013-02-07 MED ORDER — PREDNISONE 20 MG PO TABS: 40.0000 mg | ORAL_TABLET | Freq: Every day | ORAL | Status: DC

## 2013-02-07 MED ORDER — BISACODYL 5 MG PO TBEC: 5.0000 mg | DELAYED_RELEASE_TABLET | Freq: Two times a day (BID) | ORAL | Status: DC

## 2013-02-07 MED ORDER — IOHEXOL 300 MG/ML  SOLN
100.0000 mL | Freq: Once | INTRAMUSCULAR | Status: AC | PRN
  Administered 2013-02-07: 100 mL via INTRAVENOUS

## 2013-02-07 MED ORDER — DEXTROSE 50 % IV SOLN: 50.0000 mL | Freq: Once | INTRAVENOUS | Status: AC

## 2013-02-07 NOTE — ED Notes (Signed)
Pt reports lower back pain and lower abdominal pain x 2 days. Pt took cleansing pack two days ago per gastroenterologist to see if it would help her back pain. Told by MD to come to ED for CT Scan. Pt seeing Dr. Dulce Sellar with [facility].

## 2013-02-07 NOTE — ED Notes (Signed)
Pt became flushed, nauseated after given pain medications.

## 2013-02-07 NOTE — ED Notes (Signed)
Patient transported to CT 

## 2013-02-07 NOTE — ED Notes (Signed)
IV team responded to [facility] for IV start.

## 2013-02-07 NOTE — ED Provider Notes (Signed)
History    CSN: [id number] Arrival date & time 02/07/13  1259  First MD Initiated Contact with Patient 02/07/13 1346     Chief Complaint  Patient presents with  . Back Pain  . Abdominal Pain   (Consider location/radiation/quality/duration/timing/severity/associated sxs/prior Treatment) HPI Comments: 42 year old female with history of diabetes, a connective tissue disease of unknown name, possible lupus. She presents with lower abdominal pain and lower back pain. She describes a history of approximately several months of ongoing abdominal pain which has been evaluated by multiple different emergency departments as well as her gastroenterologist and thought to be constipation. Over the last week she has developed some right lower back pain which is new, severe, radiates up towards her right upper quadrant. She denies chest pain, denies pain radiating into her leg or her buttock and has no urinary symptoms. She was recently put on a bowel regimen to clean her out, had several days of diarrhea but has had return of her abdominal pain as well. She was encouraged by her gastroenterologist to come to the hospital for evaluation and a CT scan.  She denies fevers, nausea, vomiting, hematuria, dysuria.  GI - Outlaw  Patient is a 42 y.o. female presenting with back pain and abdominal pain. The history is provided by the patient.  Back Pain Associated symptoms: abdominal pain   Abdominal Pain Associated symptoms include abdominal pain.   Past Medical History  Diagnosis Date  . Diabetes mellitus   . Arthritis   . Heart murmur   . Lupus   . Connective tissue disease    Past Surgical History  Procedure Laterality Date  . Cesarean section     No family history on file. History  Substance Use Topics  . Smoking status: Never Smoker   . Smokeless tobacco: Not on file  . Alcohol Use: No   OB History   Grav Para Term Preterm Abortions TAB SAB Ect Mult Living                 Review of  Systems  Gastrointestinal: Positive for abdominal pain.  Musculoskeletal: Positive for back pain.  All other systems reviewed and are negative.    Allergies  Morphine and related and Tramadol  Home Medications   Current Outpatient Rx  Name  Route  Sig  Dispense  Refill  . atorvastatin (LIPITOR) 40 MG tablet   Oral   Take 40 mg by mouth daily.         . Bisacodyl (DULCOLAX PO)   Oral   Take 2 tablets by mouth daily as needed (constipation).         . hydroxychloroquine (PLAQUENIL) 200 MG tablet   Oral   Take 200 mg by mouth daily.         . insulin glargine (LANTUS) 100 UNIT/ML injection   Subcutaneous   Inject 10 Units into the skin 2 (two) times daily.          . insulin glulisine (APIDRA) 100 UNIT/ML injection   Subcutaneous   Inject 10 Units into the skin 3 (three) times daily before meals. Sliding scale. 15-30 Units         . bisacodyl (DULCOLAX) 5 MG EC tablet   Oral   Take 1 tablet (5 mg total) by mouth 2 (two) times daily.   14 tablet   0   . HYDROcodone-acetaminophen (NORCO/VICODIN) 5-325 MG per tablet   Oral   Take 2 tablets by mouth every 4 (four) hours as  needed for pain.   10 tablet   0   . methotrexate 25 MG/ML SOLN   Subcutaneous   Inject 25 mg into the skin once a week. On Weds only         . predniSONE (DELTASONE) 20 MG tablet   Oral   Take 2 tablets (40 mg total) by mouth daily. Take 40 mg by mouth daily for 3 days, then 20mg  by mouth daily for 3 days, then 10mg  daily for 3 days   12 tablet   0    BP 134/78  Pulse 94  Temp(Src) 98.7 F (37.1 C) (Oral)  Resp 20  SpO2 97%  LMP 01/13/2013 Physical Exam  Nursing note and vitals reviewed. Constitutional: She appears well-developed and well-nourished.  Uncomfortable appearing  HENT:  Head: Normocephalic and atraumatic.  Mouth/Throat: Oropharynx is clear and moist. No oropharyngeal exudate.  Eyes: Conjunctivae and EOM are normal. Pupils are equal, round, and reactive to  light. Right eye exhibits no discharge. Left eye exhibits no discharge. No scleral icterus.  Neck: Normal range of motion. Neck supple. No JVD present. No thyromegaly present.  Cardiovascular: Normal rate, regular rhythm, normal heart sounds and intact distal pulses.  Exam reveals no gallop and no friction rub.   No murmur heard. Pulmonary/Chest: Effort normal and breath sounds normal. No respiratory distress. She has no wheezes. She has no rales.  Abdominal: Soft. Bowel sounds are normal. She exhibits no distension and no mass. There is tenderness ( Right lower and right upper quadrant tenderness, mild guarding, no masses, no peritoneal signs).  Musculoskeletal: Normal range of motion. She exhibits no edema and no tenderness.  Lymphadenopathy:    She has no cervical adenopathy.  Neurological: She is alert. Coordination normal.  Skin: Skin is warm and dry. No rash noted. No erythema.  Psychiatric: She has a normal mood and affect. Her behavior is normal.    ED Course  Procedures (including critical care time) Labs Reviewed  COMPREHENSIVE METABOLIC PANEL - Abnormal; Notable for the following:    Albumin 3.3 (*)    Total Bilirubin 0.2 (*)    All other components within normal limits  URINALYSIS, ROUTINE W REFLEX MICROSCOPIC - Abnormal; Notable for the following:    Glucose, UA >1000 (*)    Ketones, ur 40 (*)    All other components within normal limits  GLUCOSE, CAPILLARY - Abnormal; Notable for the following:    Glucose-Capillary 100 (*)    All other components within normal limits  GLUCOSE, CAPILLARY - Abnormal; Notable for the following:    Glucose-Capillary 23 (*)    All other components within normal limits  GLUCOSE, CAPILLARY - Abnormal; Notable for the following:    Glucose-Capillary 27 (*)    All other components within normal limits  GLUCOSE, CAPILLARY - Abnormal; Notable for the following:    Glucose-Capillary 151 (*)    All other components within normal limits  CBC WITH  DIFFERENTIAL  URINE MICROSCOPIC-ADD ON  POCT PREGNANCY, URINE   Ct Abdomen Pelvis W Contrast  02/07/2013   *RADIOLOGY REPORT*  Clinical Data: Back and abdominal pain.  CT ABDOMEN AND PELVIS WITH CONTRAST  Technique:  Multidetector CT imaging of the abdomen and pelvis was performed following the standard protocol during bolus administration of intravenous contrast.  Contrast: OMNIPAQUE IOHEXOL 300 MG/ML  SOLN  Comparison: CT of the abdomen pelvis 05/25/2012.  Findings:  Lung Bases: Unremarkable.  Abdomen/Pelvis:  The appearance of the liver, gallbladder, pancreas, spleen and bilateral  adrenal glands is unremarkable. There are several tiny foci of high attenuation in the collecting systems of the kidneys bilaterally.  Whether not this represents very early excretion of contrast material or is in fact evidence of nonobstructive calculi is uncertain on today's contrast enhanced CT examination.  At least one of these areas (lower pole collecting system of the left kidney on image 31 of series 2) appears suspicious for a 3 mm nonobstructive calculus.  Normal appendix.  A trace volume of free fluid the cul-de-sac, presumably physiologic in this young female patient.  No larger volume of ascites.  No pneumoperitoneum.  No pathologic distension of small bowel.  No definite pathologic lymphadenopathy identified within the abdomen or pelvis on today's examination.  Uterus and left ovary is unremarkable in appearance.  Thick-walled cystic lesion in the right ovary measuring 2.1 cm in diameter as likely represent a degenerating corpus luteum cyst.  Musculoskeletal: There are no aggressive appearing lytic or blastic lesions noted in the visualized portions of the skeleton.  IMPRESSION: 1.  Probable nonobstructive calculus in the lower pole collecting system of the left kidney measuring only 3 mm. 2.  Small volume of free fluid the cul-de-sac is likely physiologic in this young female patient.  There appears to be a  degenerating corpus luteum cyst in the right ovary. 3.  Normal appendix. 4.  No other potential acute findings to account for the patient's symptoms.   Original Report Authenticated By: Trudie Reed, M.D.   1. Abdominal pain   2. Back pain     MDM  Tenderness to palpation over the right flank and right lower back, she is hesitant to let anybody touch her skin she states even touching the skin makes her hurt. There are no masses, no bruising, abdomen is tender on the right side only, but consider possible constipation, less likely be kidney stone, possible underlying connective tissue disorder pain. CT scan has been ordered.  The patient has improved with, she is still tearful and has mild tenderness her lower back. There is no rash in this area, no signs of zoster, laboratory workup was overall unremarkable and a CT scan shows a small amount of free fluid in the pelvis but no other significant findings to explain her symptoms. I have reviewed these findings with patient, she will followup with her family doctor. We'll try a short course of prednisone as she does have the Lupus or connective tissue disorder of unknown etiology.  Meds given in ED:  Medications  diphenhydrAMINE (BENADRYL) injection 25 mg (25 mg Intravenous Given 02/07/13 1454)  iohexol (OMNIPAQUE) 300 MG/ML solution 25 mL (not administered)  HYDROmorphone (DILAUDID) injection 1 mg (1 mg Intravenous Given 02/07/13 1454)  iohexol (OMNIPAQUE) 300 MG/ML solution 100 mL (100 mLs Intravenous Contrast Given 02/07/13 1648)  sodium chloride 0.9 % bolus 1,000 mL (1,000 mLs Intravenous New Bag/Given 02/07/13 1718)  dextrose 50 % solution 50 mL (50 mLs Intravenous Given 02/07/13 1717)    New Prescriptions   BISACODYL (DULCOLAX) 5 MG EC TABLET    Take 1 tablet (5 mg total) by mouth 2 (two) times daily.   HYDROCODONE-ACETAMINOPHEN (NORCO/VICODIN) 5-325 MG PER TABLET    Take 2 tablets by mouth every 4 (four) hours as needed for pain.    PREDNISONE (DELTASONE) 20 MG TABLET    Take 2 tablets (40 mg total) by mouth daily. Take 40 mg by mouth daily for 3 days, then 20mg  by mouth daily for 3 days, then 10mg  daily for 3  days      Vida Roller, MD 02/07/13 [phone]

## 2013-02-07 NOTE — ED Notes (Signed)
Pt refused multiple times for IV start. Iv team called for IV start.

## 2013-02-07 NOTE — ED Notes (Signed)
Pt is here with lower back pain and lower back pain.  Pt has been treated recently for constipation.  NO urinary or vaginal symptoms

## 2013-02-08 LAB — GLUCOSE, CAPILLARY: Glucose-Capillary: 23 mg/dL — CL (ref 70–99)

## 2013-03-09 ENCOUNTER — Encounter (HOSPITAL_COMMUNITY): Payer: Self-pay | Admitting: *Deleted

## 2013-03-09 ENCOUNTER — Encounter (HOSPITAL_COMMUNITY): Payer: Self-pay | Admitting: Pharmacy Technician

## 2013-03-15 ENCOUNTER — Other Ambulatory Visit: Payer: Self-pay | Admitting: Gastroenterology

## 2013-03-16 ENCOUNTER — Encounter (HOSPITAL_COMMUNITY): Payer: Self-pay | Admitting: Certified Registered Nurse Anesthetist

## 2013-03-16 ENCOUNTER — Encounter (HOSPITAL_COMMUNITY): Payer: Self-pay

## 2013-03-16 ENCOUNTER — Encounter (HOSPITAL_COMMUNITY)
Admission: RE | Disposition: A | Discharge status: Home / Self Care | Payer: Self-pay | Source: Ambulatory Visit | Attending: Gastroenterology

## 2013-03-16 ENCOUNTER — Ambulatory Visit (HOSPITAL_COMMUNITY): Payer: BC Managed Care – PPO | Admitting: Certified Registered Nurse Anesthetist

## 2013-03-16 ENCOUNTER — Ambulatory Visit (HOSPITAL_COMMUNITY)
Admission: RE | Admit: 2013-03-16 | Discharge: 2013-03-16 | Disposition: A | Discharge status: Home / Self Care | Payer: BC Managed Care – PPO | Source: Ambulatory Visit | Attending: Gastroenterology | Admitting: Gastroenterology

## 2013-03-16 DIAGNOSIS — K921 Melena: Secondary | ICD-10-CM | POA: Insufficient documentation

## 2013-03-16 DIAGNOSIS — R109 Unspecified abdominal pain: Secondary | ICD-10-CM | POA: Insufficient documentation

## 2013-03-16 DIAGNOSIS — K5900 Constipation, unspecified: Secondary | ICD-10-CM | POA: Insufficient documentation

## 2013-03-16 DIAGNOSIS — K644 Residual hemorrhoidal skin tags: Secondary | ICD-10-CM | POA: Insufficient documentation

## 2013-03-16 HISTORY — PX: COLONOSCOPY WITH PROPOFOL: SHX5780

## 2013-03-16 LAB — GLUCOSE, CAPILLARY
Glucose-Capillary: 309 mg/dL — ABNORMAL HIGH (ref 70–99)
Glucose-Capillary: 230 mg/dL — ABNORMAL HIGH (ref 70–99)
Glucose-Capillary: 97 mg/dL (ref 70–99)

## 2013-03-16 SURGERY — COLONOSCOPY WITH PROPOFOL
Anesthesia: Monitor Anesthesia Care

## 2013-03-16 MED ORDER — LACTATED RINGERS IV SOLN
INTRAVENOUS | Status: DC | PRN
  Administered 2013-03-16: 12:00:00 via INTRAVENOUS

## 2013-03-16 MED ORDER — MIDAZOLAM HCL 5 MG/5ML IJ SOLN
INTRAMUSCULAR | Status: DC | PRN
  Administered 2013-03-16: 2 mg via INTRAVENOUS

## 2013-03-16 MED ORDER — PROPOFOL INFUSION 10 MG/ML OPTIME
INTRAVENOUS | Status: DC | PRN
  Administered 2013-03-16: 140 ug/kg/min via INTRAVENOUS

## 2013-03-16 MED ORDER — SODIUM CHLORIDE 0.9 % IV SOLN: INTRAVENOUS | Status: DC

## 2013-03-16 MED ORDER — FENTANYL CITRATE 0.05 MG/ML IJ SOLN
INTRAMUSCULAR | Status: DC | PRN
  Administered 2013-03-16: 50 ug via INTRAVENOUS

## 2013-03-16 MED ORDER — LACTATED RINGERS IV SOLN
INTRAVENOUS | Status: DC
  Administered 2013-03-16: 11:00:00 via INTRAVENOUS

## 2013-03-16 MED ORDER — INSULIN ASPART 100 UNIT/ML ~~LOC~~ SOLN
0.0000 [IU] | Freq: Three times a day (TID) | SUBCUTANEOUS | Status: DC
  Administered 2013-03-16: 11 [IU] via SUBCUTANEOUS
  Filled 2013-03-16: qty 0.15

## 2013-03-16 SURGICAL SUPPLY — 22 items

## 2013-03-16 NOTE — Transfer of Care (Signed)
Immediate Anesthesia Transfer of Care Note  Patient: Tiffany [name]  Procedure(s) Performed: Procedure(s): COLONOSCOPY WITH PROPOFOL (N/A)  Patient Location: PACU and Endoscopy Unit  Anesthesia Type:MAC  Level of Consciousness: awake and alert   Airway & Oxygen Therapy: Patient Spontanous Breathing and Patient connected to face mask oxygen  Post-op Assessment: Report given to PACU RN and Post -op Vital signs reviewed and stable  Post vital signs: Reviewed and stable  Complications: No apparent anesthesia complications

## 2013-03-16 NOTE — H&P (Signed)
Patient interval history reviewed.  Patient examined again.  There has been no change from documented H/P dated 03/15/13 (scanned into chart from our office) except as documented above.  Assessment:  1.  Abdominal pain. 2.  Constipation. 3.  Blood in stool.  Plan:  1.  Colonoscopy. 2.  Risks (bleeding, infection, bowel perforation that could require surgery, sedation-related changes in cardiopulmonary systems), benefits (identification and possible treatment of source of symptoms, exclusion of certain causes of symptoms), and alternatives (watchful waiting, radiographic imaging studies, empiric medical treatment) of colonoscopy were explained to patient/family in detail and patient wishes to proceed.

## 2013-03-16 NOTE — Anesthesia Preprocedure Evaluation (Signed)
Anesthesia Evaluation  Patient identified by MRN, date of birth, ID band Patient awake    Reviewed: Allergy & Precautions, H&P , NPO status , Patient's Chart, lab work & pertinent test results  Airway Mallampati: II TM Distance: >3 FB Neck ROM: Full    Dental no notable dental hx.    Pulmonary neg pulmonary ROS,  breath sounds clear to auscultation  Pulmonary exam normal       Cardiovascular negative cardio ROS  Rhythm:Regular Rate:Normal     Neuro/Psych negative neurological ROS  negative psych ROS   GI/Hepatic negative GI ROS, Neg liver ROS,   Endo/Other  diabetes, Type 1, Insulin Dependent  Renal/GU negative Renal ROS  negative genitourinary   Musculoskeletal negative musculoskeletal ROS (+)   Abdominal   Peds negative pediatric ROS (+)  Hematology negative hematology ROS (+)   Anesthesia Other Findings   Reproductive/Obstetrics negative OB ROS                           Anesthesia Physical Anesthesia Plan  ASA: II  Anesthesia Plan: MAC   Post-op Pain Management:    Induction: Intravenous  Airway Management Planned: Simple Face Mask  Additional Equipment:   Intra-op Plan:   Post-operative Plan: Extubation in OR  Informed Consent: I have reviewed the patients History and Physical, chart, labs and discussed the procedure including the risks, benefits and alternatives for the proposed anesthesia with the patient or authorized representative who has indicated his/her understanding and acceptance.   Dental advisory given  Plan Discussed with: CRNA  Anesthesia Plan Comments:         Anesthesia Quick Evaluation

## 2013-03-16 NOTE — Op Note (Signed)
[facility] [address] [place], [address]   COLONOSCOPY PROCEDURE REPORT  PATIENT: [name], [name]  MR#: [id number] BIRTHDATE: [birth year] , 41  yrs. old GENDER: Female ENDOSCOPIST: Willis Modena, MD REFERRED [id number] Redmon, P.A. PROCEDURE DATE:  03/16/2013 PROCEDURE:   Colonoscopy, diagnostic ASA CLASS:   Class II INDICATIONS:abdominal pain, constipation, blood in stool. MEDICATIONS: MAC sedation, administered by CRNA  DESCRIPTION OF PROCEDURE:   After the risks benefits and alternatives of the procedure were thoroughly explained, informed consent was obtained.  A digital rectal exam revealed external hemorrhoids.   The Pentax Ped Colon [id number]  endoscope was introduced through the anus and advanced to the terminal ileum which was intubated for a short distance. No adverse events experienced.   The quality of the prep was fair.  The instrument was then slowly withdrawn as the colon was fully examined.     Findings:  External hemorrhoids, otherwise normal digital rectal exam.  Prep quality fair; diminutive or subtle lesions could have been missed.  No polyps, masses, vascular ectasias, or inflammatory changes were seen.  Distal 5cm of the terminal ileum was normal.       Retroflexed view of rectum was normal. Withdrawal time was about 8 minutes     .  The scope was withdrawn and the procedure completed.  ENDOSCOPIC IMPRESSION:     As above.  External hemorrhoids could lead to intermittent hematochezia.  No explanation for patient's abdominal pain was seen.  RECOMMENDATIONS:     1.  Watch for potential complications of procedure. 2.  Topical therapies (e.g., Preparation-H) as needed for intermittent blood in stool. 3.  If abdominal pain worsens, would repeat CT abdomen/pelvis with contrast. 4.  If CT is unrevealing, and pain persists, consider surgical consultation for consideration of exploratory laparoscopy.   eSigned:  Willis Modena, MD  03/16/2013 1:04 PM   cc:

## 2013-03-16 NOTE — Addendum Note (Signed)
Addended by: Gia Lusher on: 03/16/2013 08:16 AM   Modules accepted: Orders  

## 2013-03-17 ENCOUNTER — Encounter (HOSPITAL_COMMUNITY): Payer: Self-pay | Admitting: Gastroenterology

## 2013-03-17 NOTE — Anesthesia Postprocedure Evaluation (Signed)
  Anesthesia Post-op Note  Patient: Tiffany [name]  Procedure(s) Performed: Procedure(s) (LRB): COLONOSCOPY WITH PROPOFOL (N/A)  Patient Location: PACU  Anesthesia Type: MAC  Level of Consciousness: awake and alert   Airway and Oxygen Therapy: Patient Spontanous Breathing  Post-op Pain: mild  Post-op Assessment: Post-op Vital signs reviewed, Patient's Cardiovascular Status Stable, Respiratory Function Stable, Patent Airway and No signs of Nausea or vomiting  Last Vitals:  Filed Vitals:   03/16/13 1335  BP: 129/77  Pulse:   Temp:   Resp: 15    Post-op Vital Signs: stable   Complications: No apparent anesthesia complications

## 2013-04-17 ENCOUNTER — Emergency Department (HOSPITAL_COMMUNITY)
Admission: EM | Admit: 2013-04-17 | Discharge: 2013-04-18 | Disposition: A | Discharge status: Home / Self Care | Payer: BC Managed Care – PPO | Attending: Emergency Medicine | Admitting: Emergency Medicine

## 2013-04-17 ENCOUNTER — Encounter (HOSPITAL_COMMUNITY): Payer: Self-pay | Admitting: Emergency Medicine

## 2013-04-17 DIAGNOSIS — R109 Unspecified abdominal pain: Secondary | ICD-10-CM | POA: Insufficient documentation

## 2013-04-17 DIAGNOSIS — M129 Arthropathy, unspecified: Secondary | ICD-10-CM | POA: Insufficient documentation

## 2013-04-17 DIAGNOSIS — E119 Type 2 diabetes mellitus without complications: Secondary | ICD-10-CM | POA: Insufficient documentation

## 2013-04-17 DIAGNOSIS — G8929 Other chronic pain: Secondary | ICD-10-CM | POA: Insufficient documentation

## 2013-04-17 DIAGNOSIS — R110 Nausea: Secondary | ICD-10-CM | POA: Insufficient documentation

## 2013-04-17 DIAGNOSIS — M329 Systemic lupus erythematosus, unspecified: Secondary | ICD-10-CM | POA: Insufficient documentation

## 2013-04-17 DIAGNOSIS — Z79899 Other long term (current) drug therapy: Secondary | ICD-10-CM | POA: Insufficient documentation

## 2013-04-17 DIAGNOSIS — Z794 Long term (current) use of insulin: Secondary | ICD-10-CM | POA: Insufficient documentation

## 2013-04-17 DIAGNOSIS — M549 Dorsalgia, unspecified: Secondary | ICD-10-CM | POA: Insufficient documentation

## 2013-04-17 LAB — COMPREHENSIVE METABOLIC PANEL
ALT: 12 U/L (ref 0–35)
AST: 16 U/L (ref 0–37)
Albumin: 4.4 g/dL (ref 3.5–5.2)
Alkaline Phosphatase: 135 U/L — ABNORMAL HIGH (ref 39–117)
BUN: 18 mg/dL (ref 6–23)
CO2: 28 mEq/L (ref 19–32)
Calcium: 9.8 mg/dL (ref 8.4–10.5)
Chloride: 92 mEq/L — ABNORMAL LOW (ref 96–112)
Creatinine, Ser: 0.59 mg/dL (ref 0.50–1.10)
GFR calc Af Amer: >90 mL/min (ref >90)
GFR calc non Af Amer: >90 mL/min (ref >90)
Glucose, Bld: 423 mg/dL — ABNORMAL HIGH (ref 70–99)
Potassium: 4.2 mEq/L (ref 3.5–5.1)
Sodium: 132 mEq/L — ABNORMAL LOW (ref 135–145)
Total Bilirubin: 0.2 mg/dL — ABNORMAL LOW (ref 0.3–1.2)
Total Protein: 8.8 g/dL — ABNORMAL HIGH (ref 6.0–8.3)

## 2013-04-17 LAB — CBC WITH DIFFERENTIAL/PLATELET
Basophils Absolute: 0 10*3/uL (ref 0.0–0.1)
Basophils Relative: 0 % (ref 0–1)
Eosinophils Absolute: 0 10*3/uL (ref 0.0–0.7)
Eosinophils Relative: 0 % (ref 0–5)
HCT: 38.4 % (ref 36.0–46.0)
Hemoglobin: 12.9 g/dL (ref 12.0–15.0)
Lymphocytes Relative: 21 % (ref 12–46)
Lymphs Abs: 2.1 10*3/uL (ref 0.7–4.0)
MCH: 28.4 pg (ref 26.0–34.0)
MCHC: 33.6 g/dL (ref 30.0–36.0)
MCV: 84.6 fL (ref 78.0–100.0)
Monocytes Absolute: 0.5 10*3/uL (ref 0.1–1.0)
Monocytes Relative: 5 % (ref 3–12)
Neutro Abs: 7.2 10*3/uL (ref 1.7–7.7)
Neutrophils Relative %: 74 % (ref 43–77)
Platelets: 392 10*3/uL (ref 150–400)
RBC: 4.54 MIL/uL (ref 3.87–5.11)
RDW: 13 % (ref 11.5–15.5)
WBC: 9.7 10*3/uL (ref 4.0–10.5)

## 2013-04-17 LAB — URINE MICROSCOPIC-ADD ON

## 2013-04-17 LAB — URINALYSIS, ROUTINE W REFLEX MICROSCOPIC
Bilirubin Urine: NEGATIVE
Glucose, UA: >1000 mg/dL — AB
Ketones, ur: >80 mg/dL — AB
Leukocytes, UA: NEGATIVE
Nitrite: NEGATIVE
Protein, ur: NEGATIVE mg/dL
Specific Gravity, Urine: 1.038 — ABNORMAL HIGH (ref 1.005–1.030)
Urobilinogen, UA: 0.2 mg/dL (ref 0.0–1.0)
pH: 5 (ref 5.0–8.0)

## 2013-04-17 LAB — LIPASE, BLOOD: Lipase: 14 U/L (ref 11–59)

## 2013-04-17 MED ORDER — SODIUM CHLORIDE 0.9 % IV BOLUS (SEPSIS)
1000.0000 mL | Freq: Once | INTRAVENOUS | Status: AC
Start: 2013-04-17 — End: 2013-04-18
  Administered 2013-04-17: 1000 mL via INTRAVENOUS

## 2013-04-17 MED ORDER — GI COCKTAIL ~~LOC~~
30.0000 mL | Freq: Once | ORAL | Status: AC
  Administered 2013-04-18: 30 mL via ORAL
  Filled 2013-04-17: qty 30

## 2013-04-17 MED ORDER — INSULIN ASPART 100 UNIT/ML ~~LOC~~ SOLN
10.0000 [IU] | Freq: Once | SUBCUTANEOUS | Status: AC
  Administered 2013-04-17: 10 [IU] via SUBCUTANEOUS
  Filled 2013-04-17: qty 1

## 2013-04-17 MED ORDER — DIPHENHYDRAMINE HCL 50 MG/ML IJ SOLN
25.0000 mg | Freq: Once | INTRAMUSCULAR | Status: AC
  Administered 2013-04-17: 25 mg via INTRAVENOUS
  Filled 2013-04-17: qty 1

## 2013-04-17 MED ORDER — ONDANSETRON HCL 4 MG/2ML IJ SOLN
4.0000 mg | Freq: Once | INTRAMUSCULAR | Status: AC
  Administered 2013-04-17: 4 mg via INTRAVENOUS
  Filled 2013-04-17: qty 2

## 2013-04-17 MED ORDER — HYDROMORPHONE HCL PF 1 MG/ML IJ SOLN
1.0000 mg | Freq: Once | INTRAMUSCULAR | Status: AC
  Administered 2013-04-17: 1 mg via INTRAVENOUS
  Filled 2013-04-17: qty 1

## 2013-04-17 NOTE — ED Notes (Signed)
Pt sts chronic lower right sided abd pain x years intermittent; pt tearful

## 2013-04-17 NOTE — ED Provider Notes (Signed)
CSN: [id number]     Arrival date & time 04/17/13  1738 History   First MD Initiated Contact with Patient 04/17/13 2259     Chief Complaint  Patient presents with  . Abdominal Pain   (Consider location/radiation/quality/duration/timing/severity/associated sxs/prior Treatment) HPI  Hx per PT - R sided LBP for months now, had MRI 2 weeks ago for this.  She has had extensive work up and multiple CT scans.  She has associated R sided ABD pain - her OB GYN suggested possibility of endometriosis and further GYN work up with follow up in 3 weeks. 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  She ahs been taking hydrocodone at home which is not helping. Pain severe, sharp in quality, associated nausea no emesis. No F/C, no vag bleeding, no vag discharge.  With LBP no associated weakness/ numbness, urinary or bladder incontinence. Followed BY [facility] PCP, GI Dr Dulce Sellar, Ob GYN Dr Cherly Hensen, DR Guss [facility].  Tonight she is worried about having endometrioisis and is requesting something for pain. Had ovarian cyst 3 weeks ago with [place] by her OB. Pain is unchanged from chronic ongoing pains.   Past Medical History  Diagnosis Date  . Diabetes mellitus   . Arthritis   . Heart murmur   . Lupus   . Connective tissue disease    Past Surgical History  Procedure Laterality Date  . Cesarean section    . Colonoscopy with propofol N/A 03/16/2013    Procedure: COLONOSCOPY WITH PROPOFOL;  Surgeon: Willis Modena, MD;  Location: [facility];  Service: Endoscopy;  Laterality: N/A;   History reviewed. No pertinent family history. History  Substance Use Topics  . Smoking status: Never Smoker   . Smokeless tobacco: Not on file  . Alcohol Use: No   OB History   Grav Para Term Preterm Abortions TAB SAB Ect Mult Living                 Review of Systems  Constitutional: Negative for fever and chills.  HENT: Negative for neck pain and neck stiffness.   Eyes: Negative for pain.   Respiratory: Negative for shortness of breath.   Cardiovascular: Negative for chest pain.  Gastrointestinal: Positive for abdominal pain.  Genitourinary: Negative for dysuria, urgency, frequency, vaginal bleeding and vaginal discharge.  Musculoskeletal: Positive for back pain.  Skin: Negative for rash.  Neurological: Negative for headaches.  All other systems reviewed and are negative.    Allergies  Morphine and related and Tramadol  Home Medications   Current Outpatient Rx  Name  Route  Sig  Dispense  Refill  . amitriptyline (ELAVIL) 10 MG tablet   Oral   Take 10 mg by mouth at bedtime as needed for sleep.          Marland Kitchen atorvastatin (LIPITOR) 40 MG tablet   Oral   Take 40 mg by mouth every morning.          . bisacodyl (DULCOLAX) 5 MG EC tablet   Oral   Take 5 mg by mouth daily as needed for constipation.         Marland Kitchen HYDROcodone-acetaminophen (NORCO/VICODIN) 5-325 MG per tablet   Oral   Take 2 tablets by mouth every 4 (four) hours as needed for pain.   10 tablet   0   . hydroxychloroquine (PLAQUENIL) 200 MG tablet   Oral   Take 200 mg by mouth every morning.          Marland Kitchen  insulin glargine (LANTUS) 100 UNIT/ML injection   Subcutaneous   Inject 10 Units into the skin 2 (two) times daily.          . insulin glulisine (APIDRA) 100 UNIT/ML injection   Subcutaneous   Inject 10-16 Units into the skin 3 (three) times daily before meals.          . methotrexate 25 MG/ML SOLN   Subcutaneous   Inject 25 mg into the skin once a week. Takes on Sunday          BP 156/86  Pulse 100  Temp(Src) 98.2 F (36.8 C) (Oral)  Resp 20  SpO2 100% Physical Exam  Constitutional: She is oriented to person, place, and time. She appears well-developed and well-nourished.  HENT:  Head: Normocephalic and atraumatic.  Eyes: EOM are normal. Pupils are equal, round, and reactive to light. No scleral icterus.  Neck: Neck supple.  Cardiovascular: Normal rate, regular rhythm and  intact distal pulses.   Pulmonary/Chest: Effort normal and breath sounds normal. No respiratory distress. She exhibits no tenderness.  Abdominal: Soft. Bowel sounds are normal. She exhibits no distension. There is no rebound and no guarding.  TTP R sided ABD RUQ/ RLQ no guarding or rebound  Musculoskeletal: Normal range of motion. She exhibits no edema.  TTP R lower paralumbar no midline tenderness  Neurological: She is alert and oriented to person, place, and time.  Skin: Skin is warm and dry.    ED Course  Procedures (including critical care time) Labs Review Labs Reviewed  COMPREHENSIVE METABOLIC PANEL - Abnormal; Notable for the following:    Sodium 132 (*)    Chloride 92 (*)    Glucose, Bld 423 (*)    Total Protein 8.8 (*)    Alkaline Phosphatase 135 (*)    Total Bilirubin 0.2 (*)    All other components within normal limits  URINALYSIS, ROUTINE W REFLEX MICROSCOPIC - Abnormal; Notable for the following:    Specific Gravity, Urine 1.038 (*)    Glucose, UA >1000 (*)    Hgb urine dipstick MODERATE (*)    Ketones, ur >80 (*)    All other components within normal limits  URINE MICROSCOPIC-ADD ON - Abnormal; Notable for the following:    Squamous Epithelial / LPF FEW (*)    Bacteria, UA MANY (*)    All other components within normal limits  CBC WITH DIFFERENTIAL  LIPASE, BLOOD   Imaging Review US Transvaginal Non-ob  04/18/2013   CLINICAL DATA:  Right-sided pelvic pain.  EXAM: TRANSVAGINAL ULTRASOUND OF PELVIS  TECHNIQUE: Transvaginal ultrasound examination of the pelvis was performed including evaluation of the uterus, ovaries, adnexal regions, and pelvic cul-de-sac.  COMPARISON:  CT abdomen and pelvis 02/07/2013 and single view of the abdomen 01/06/2013.  FINDINGS: Uterus  Measurements: 9.1 x 4.1 x 5.3 cm. A 1.8 cm fibroid is seen off the fundus of the uterus anteriorly.  Endometrium  Thickness: 0.8 cm.  No focal abnormality visualized.  Right ovary  Measurements: 3.7 x 1.4 x  1.8 cm. Normal appearance/no adnexal mass.  Left ovary  Not visualized.  Other findings: There appears to be a large amount of stool in loops of colon about the left adnexa.  IMPRESSION: No acute finding. The left ovary is not visualized.  Large volume of stool within loops of colon about the left adnexa noted.   Electronically Signed   By: Drusilla Kanner M.D.   On: 04/18/2013 00:46   US Pelvis Complete  04/18/2013  CLINICAL DATA:  Right-sided pelvic pain.  EXAM: TRANSVAGINAL ULTRASOUND OF PELVIS  TECHNIQUE: Transvaginal ultrasound examination of the pelvis was performed including evaluation of the uterus, ovaries, adnexal regions, and pelvic cul-de-sac.  COMPARISON:  CT abdomen and pelvis 02/07/2013 and single view of the abdomen 01/06/2013.  FINDINGS: Uterus  Measurements: 9.1 x 4.1 x 5.3 cm. A 1.8 cm fibroid is seen off the fundus of the uterus anteriorly.  Endometrium  Thickness: 0.8 cm.  No focal abnormality visualized.  Right ovary  Measurements: 3.7 x 1.4 x 1.8 cm. Normal appearance/no adnexal mass.  Left ovary  Not visualized.  Other findings: There appears to be a large amount of stool in loops of colon about the left adnexa.  IMPRESSION: No acute finding. The left ovary is not visualized.  Large volume of stool within loops of colon about the left adnexa noted.   Electronically Signed   By: Drusilla Kanner M.D.   On: 04/18/2013 00:46    11:12 PM PT declines CT scan tonight - she states she has had many ultrasounds and her GB is normal and they never see anything on her CT scans.  PT given IV Dilaudid, benadryl and zofran. UA reviewed - PT denies any UTI symptoms. U Cx pending.   2:04 AM pain improved, no longer tender on exam. She is requesting to be discharged. Last BM 4 days ago, is no longer on dulcolax. Has Rx for another stool softner and agrees to take it.   I offered Rx percocet and she declines as she cares for her 61 y/o daughter at home. She will continue hydrocodone at night time and  call her GYN for follow up this week. Return precautions verbalized as understood.   MDM  Dx: Chronic back pain, Abdominal pain [place] Labs UA Improved with IV narcotics/ benadryl for itching VS, old records and nurses notes reviewed   Sunnie Nielsen, MD 04/18/13 0401

## 2013-04-18 ENCOUNTER — Emergency Department (HOSPITAL_COMMUNITY): Payer: BC Managed Care – PPO

## 2013-04-18 MED ORDER — DIPHENHYDRAMINE HCL 25 MG PO CAPS
25.0000 mg | ORAL_CAPSULE | Freq: Once | ORAL | Status: AC
  Administered 2013-04-18: 25 mg via ORAL
  Filled 2013-04-18: qty 1

## 2013-04-18 MED ORDER — DIPHENHYDRAMINE HCL 50 MG/ML IJ SOLN
25.0000 mg | Freq: Once | INTRAMUSCULAR | Status: DC
  Filled 2013-04-18: qty 1

## 2013-04-18 MED ORDER — HYDROXYZINE HCL 25 MG PO TABS
25.0000 mg | ORAL_TABLET | Freq: Once | ORAL | Status: AC
  Administered 2013-04-18: 25 mg via ORAL
  Filled 2013-04-18 (×2): qty 1

## 2013-04-18 NOTE — ED Notes (Signed)
Patient transported to Ultrasound 

## 2013-04-18 NOTE — ED Notes (Signed)
Pt requesting more benedryl for itching.

## 2013-05-18 ENCOUNTER — Other Ambulatory Visit: Payer: Self-pay | Admitting: Obstetrics and Gynecology

## 2013-05-18 DIAGNOSIS — S2000XA Contusion of breast, unspecified breast, initial encounter: Secondary | ICD-10-CM

## 2013-05-18 DIAGNOSIS — N6489 Other specified disorders of breast: Secondary | ICD-10-CM

## 2013-05-18 DIAGNOSIS — N644 Mastodynia: Secondary | ICD-10-CM

## 2013-05-31 ENCOUNTER — Ambulatory Visit
Admission: RE | Admit: 2013-05-31 | Discharge: 2013-05-31 | Disposition: A | Discharge status: Home / Self Care | Payer: BC Managed Care – PPO | Source: Ambulatory Visit | Attending: Obstetrics and Gynecology | Admitting: Obstetrics and Gynecology

## 2013-05-31 DIAGNOSIS — N6489 Other specified disorders of breast: Secondary | ICD-10-CM

## 2013-05-31 DIAGNOSIS — N644 Mastodynia: Secondary | ICD-10-CM

## 2013-05-31 DIAGNOSIS — S2000XA Contusion of breast, unspecified breast, initial encounter: Secondary | ICD-10-CM

## 2013-06-29 ENCOUNTER — Other Ambulatory Visit: Payer: Self-pay | Admitting: Obstetrics and Gynecology

## 2013-07-04 ENCOUNTER — Inpatient Hospital Stay (HOSPITAL_COMMUNITY)
Admission: RE | Admit: 2013-07-04 | Discharge: 2013-07-04 | Disposition: A | Discharge status: Home / Self Care | Payer: BC Managed Care – PPO | Source: Ambulatory Visit

## 2013-07-04 NOTE — Patient Instructions (Signed)
   Your procedure is scheduled on: Monday, Dec 15  Enter through the [employer] of [facility] at: 1030 AM Pick up the phone at the desk and dial [phone] and inform us of your arrival.  Please call this number if you have any problems the morning of surgery: [phone]  Remember: Do not eat food after midnight: Sunday Do not drink clear liquids after: 8 am Monday Take these medicines the morning of surgery with a SIP OF WATER:  Do not wear jewelry, make-up, or FINGER nail polish No metal in your hair or on your body. Do not wear lotions, powders, perfumes. You may wear deodorant.  Please use your CHG wash as directed prior to surgery.  Do not shave anywhere for at least 12 hours prior to first CHG shower.  Do not bring valuables to the hospital. Contacts, dentures or bridgework may not be worn into surgery.  Leave suitcase in the car. After Surgery it may be brought to your room. For patients being admitted to the hospital, checkout time is 11:00am the day of discharge.  Patients discharged on the day of surgery will not be allowed to drive home.

## 2013-07-07 NOTE — Patient Instructions (Addendum)
   Your procedure is scheduled on: Monday, Dec 15  Enter through the Main Entrance of [facility] at:1030 AM Pick up the phone at the desk and dial [phone] and inform us of your arrival.  Please call this number if you have any problems the morning of surgery: [phone]  Remember: Do not eat food after midnight: Sunday Do not drink clear liquids after: 8 AM Monday, day of surgery Take these medicines the morning of surgery with a SIP OF WATER:   Plaquenil, lipitor.  Patient instructed to withhold lantus insulin Sunday night  Do not wear jewelry, make-up, or FINGER nail polish No metal in your hair or on your body. Do not wear lotions, powders, perfumes. You may wear deodorant.  Please use your CHG wash as directed prior to surgery.  Do not shave anywhere for at least 12 hours prior to first CHG shower.  Do not bring valuables to the hospital. Contacts, dentures or bridgework may not be worn into surgery.  Leave suitcase in the car. After Surgery it may be brought to your room. For patients being admitted to the hospital, checkout time is 11:00am the day of discharge.  Patients discharged on the day of surgery will not be allowed to drive home.   Home with Husband Debby Bud cell [phone]

## 2013-07-08 ENCOUNTER — Other Ambulatory Visit: Payer: Self-pay

## 2013-07-08 ENCOUNTER — Encounter (HOSPITAL_COMMUNITY)
Admission: RE | Admit: 2013-07-08 | Discharge: 2013-07-08 | Disposition: A | Discharge status: Home / Self Care | Payer: BC Managed Care – PPO | Source: Ambulatory Visit | Attending: Obstetrics and Gynecology | Admitting: Obstetrics and Gynecology

## 2013-07-08 ENCOUNTER — Encounter (HOSPITAL_COMMUNITY): Payer: Self-pay

## 2013-07-08 HISTORY — DX: Depression, unspecified: F32.A

## 2013-07-08 HISTORY — DX: Headache: R51

## 2013-07-08 HISTORY — DX: Hyperlipidemia, unspecified: E78.5

## 2013-07-08 HISTORY — DX: Major depressive disorder, single episode, unspecified: F32.9

## 2013-07-08 HISTORY — DX: Personal history of urinary calculi: Z87.442

## 2013-07-08 LAB — CBC
HCT: 34.6 % — ABNORMAL LOW (ref 36.0–46.0)
Hemoglobin: 11 g/dL — ABNORMAL LOW (ref 12.0–15.0)
MCH: 26.3 pg (ref 26.0–34.0)
MCHC: 31.8 g/dL (ref 30.0–36.0)
MCV: 82.6 fL (ref 78.0–100.0)
Platelets: 347 10*3/uL (ref 150–400)
RBC: 4.19 MIL/uL (ref 3.87–5.11)
RDW: 14.2 % (ref 11.5–15.5)
WBC: 8.6 10*3/uL (ref 4.0–10.5)

## 2013-07-08 LAB — BASIC METABOLIC PANEL
BUN: 15 mg/dL (ref 6–23)
CO2: 28 mEq/L (ref 19–32)
Calcium: 9.4 mg/dL (ref 8.4–10.5)
Chloride: 99 mEq/L (ref 96–112)
Creatinine, Ser: 0.6 mg/dL (ref 0.50–1.10)
GFR calc Af Amer: >90 mL/min (ref >90)
GFR calc non Af Amer: >90 mL/min (ref >90)
Glucose, Bld: 254 mg/dL — ABNORMAL HIGH (ref 70–99)
Potassium: 4.1 mEq/L (ref 3.5–5.1)
Sodium: 135 mEq/L (ref 135–145)

## 2013-07-11 ENCOUNTER — Encounter (HOSPITAL_COMMUNITY)
Admission: RE | Disposition: A | Discharge status: Home / Self Care | Payer: Self-pay | Source: Ambulatory Visit | Attending: Obstetrics and Gynecology

## 2013-07-11 ENCOUNTER — Ambulatory Visit (HOSPITAL_COMMUNITY)
Admission: RE | Admit: 2013-07-11 | Discharge: 2013-07-11 | Disposition: A | Discharge status: Home / Self Care | Payer: BC Managed Care – PPO | Source: Ambulatory Visit | Attending: Obstetrics and Gynecology | Admitting: Obstetrics and Gynecology

## 2013-07-11 ENCOUNTER — Ambulatory Visit (HOSPITAL_COMMUNITY): Payer: BC Managed Care – PPO | Admitting: Anesthesiology

## 2013-07-11 ENCOUNTER — Encounter (HOSPITAL_COMMUNITY): Payer: Self-pay | Admitting: *Deleted

## 2013-07-11 ENCOUNTER — Encounter (HOSPITAL_COMMUNITY): Payer: BC Managed Care – PPO | Admitting: Anesthesiology

## 2013-07-11 DIAGNOSIS — N920 Excessive and frequent menstruation with regular cycle: Secondary | ICD-10-CM | POA: Insufficient documentation

## 2013-07-11 DIAGNOSIS — N736 Female pelvic peritoneal adhesions (postinfective): Secondary | ICD-10-CM | POA: Insufficient documentation

## 2013-07-11 DIAGNOSIS — Z9889 Other specified postprocedural states: Secondary | ICD-10-CM

## 2013-07-11 DIAGNOSIS — N938 Other specified abnormal uterine and vaginal bleeding: Secondary | ICD-10-CM

## 2013-07-11 DIAGNOSIS — M549 Dorsalgia, unspecified: Secondary | ICD-10-CM | POA: Insufficient documentation

## 2013-07-11 HISTORY — PX: HYSTEROSCOPY WITH D & C: SHX1775

## 2013-07-11 HISTORY — PX: ROBOTIC ASSISTED LAPAROSCOPIC LYSIS OF ADHESION: SHX6080

## 2013-07-11 LAB — GLUCOSE, CAPILLARY
Glucose-Capillary: 323 mg/dL — ABNORMAL HIGH (ref 70–99)
Glucose-Capillary: 228 mg/dL — ABNORMAL HIGH (ref 70–99)
Glucose-Capillary: 234 mg/dL — ABNORMAL HIGH (ref 70–99)
Glucose-Capillary: 223 mg/dL — ABNORMAL HIGH (ref 70–99)

## 2013-07-11 SURGERY — ROBOTIC ASSISTED LAPAROSCOPIC LYSIS OF ADHESION
Anesthesia: General | Site: Vagina

## 2013-07-11 MED ORDER — NALBUPHINE SYRINGE 5 MG/0.5 ML
INJECTION | INTRAMUSCULAR | Status: AC
  Administered 2013-07-11: 2.5 mg via SUBCUTANEOUS
  Filled 2013-07-11: qty 0.5

## 2013-07-11 MED ORDER — IBUPROFEN 800 MG PO TABS: 800.0000 mg | ORAL_TABLET | Freq: Three times a day (TID) | ORAL | Status: DC | PRN

## 2013-07-11 MED ORDER — GLYCOPYRROLATE 0.2 MG/ML IJ SOLN
INTRAMUSCULAR | Status: AC
  Filled 2013-07-11: qty 2

## 2013-07-11 MED ORDER — GLYCOPYRROLATE 0.2 MG/ML IJ SOLN
INTRAMUSCULAR | Status: DC | PRN
  Administered 2013-07-11: 0.6 mg via INTRAVENOUS

## 2013-07-11 MED ORDER — STERILE WATER FOR IRRIGATION IR SOLN
Status: DC | PRN
  Administered 2013-07-11: 1000 mL via INTRAVESICAL

## 2013-07-11 MED ORDER — BUPIVACAINE HCL (PF) 0.25 % IJ SOLN
INTRAMUSCULAR | Status: AC
  Filled 2013-07-11: qty 30

## 2013-07-11 MED ORDER — MIDAZOLAM HCL 2 MG/2ML IJ SOLN
INTRAMUSCULAR | Status: DC | PRN
  Administered 2013-07-11: 2 mg via INTRAVENOUS

## 2013-07-11 MED ORDER — ZOLPIDEM TARTRATE ER 12.5 MG PO TBCR: 12.5000 mg | EXTENDED_RELEASE_TABLET | Freq: Every evening | ORAL | Status: DC | PRN

## 2013-07-11 MED ORDER — DIPHENHYDRAMINE HCL 50 MG/ML IJ SOLN
INTRAMUSCULAR | Status: AC
  Filled 2013-07-11: qty 1

## 2013-07-11 MED ORDER — GLYCOPYRROLATE 0.2 MG/ML IJ SOLN
INTRAMUSCULAR | Status: AC
  Filled 2013-07-11: qty 1

## 2013-07-11 MED ORDER — INSULIN REGULAR HUMAN 100 UNIT/ML IJ SOLN
5.0000 [IU] | Freq: Once | INTRAMUSCULAR | Status: AC
  Administered 2013-07-11: 5 [IU] via INTRAVENOUS
  Filled 2013-07-11 (×2): qty 0.05

## 2013-07-11 MED ORDER — BUPIVACAINE HCL (PF) 0.25 % IJ SOLN
INTRAMUSCULAR | Status: DC | PRN
  Administered 2013-07-11: 8 mL

## 2013-07-11 MED ORDER — LIDOCAINE HCL (CARDIAC) 20 MG/ML IV SOLN
INTRAVENOUS | Status: AC
  Filled 2013-07-11: qty 5

## 2013-07-11 MED ORDER — PHENYLEPHRINE 40 MCG/ML (10ML) SYRINGE FOR IV PUSH (FOR BLOOD PRESSURE SUPPORT)
PREFILLED_SYRINGE | INTRAVENOUS | Status: AC
  Filled 2013-07-11: qty 5

## 2013-07-11 MED ORDER — NALBUPHINE SYRINGE 5 MG/0.5 ML
2.5000 mg | INJECTION | Freq: Once | INTRAMUSCULAR | Status: AC
  Administered 2013-07-11: 2.5 mg via SUBCUTANEOUS

## 2013-07-11 MED ORDER — ARTIFICIAL TEARS OP OINT
TOPICAL_OINTMENT | OPHTHALMIC | Status: DC | PRN
  Administered 2013-07-11: 1 via OPHTHALMIC

## 2013-07-11 MED ORDER — HYDROMORPHONE HCL PF 1 MG/ML IJ SOLN
INTRAMUSCULAR | Status: AC
  Administered 2013-07-11 (×2): 1 mg via INTRAVENOUS
  Filled 2013-07-11: qty 1

## 2013-07-11 MED ORDER — FENTANYL CITRATE 0.05 MG/ML IJ SOLN
INTRAMUSCULAR | Status: AC
  Filled 2013-07-11: qty 5

## 2013-07-11 MED ORDER — LACTATED RINGERS IR SOLN
Status: DC | PRN
  Administered 2013-07-11: 3000 mL

## 2013-07-11 MED ORDER — KETOROLAC TROMETHAMINE 30 MG/ML IJ SOLN
INTRAMUSCULAR | Status: DC | PRN
  Administered 2013-07-11: 30 mg via INTRAMUSCULAR
  Administered 2013-07-11: 30 mg via INTRAVENOUS

## 2013-07-11 MED ORDER — LIDOCAINE HCL (CARDIAC) 20 MG/ML IV SOLN
INTRAVENOUS | Status: DC | PRN
  Administered 2013-07-11: 60 mg via INTRAVENOUS

## 2013-07-11 MED ORDER — METHYLENE BLUE 1 % INJ SOLN
INTRAMUSCULAR | Status: AC
  Filled 2013-07-11: qty 10

## 2013-07-11 MED ORDER — LACTATED RINGERS IV SOLN
INTRAVENOUS | Status: DC
  Administered 2013-07-11 (×2): via INTRAVENOUS

## 2013-07-11 MED ORDER — CHLOROPROCAINE HCL 1 % IJ SOLN
INTRAMUSCULAR | Status: AC
Start: 2013-07-11 — End: 2013-07-11
  Filled 2013-07-11: qty 30

## 2013-07-11 MED ORDER — PROPOFOL 10 MG/ML IV BOLUS
INTRAVENOUS | Status: DC | PRN
  Administered 2013-07-11: 200 mg via INTRAVENOUS

## 2013-07-11 MED ORDER — CHLOROPROCAINE HCL 1 % IJ SOLN
INTRAMUSCULAR | Status: DC | PRN
  Administered 2013-07-11: 20 mL

## 2013-07-11 MED ORDER — ROCURONIUM BROMIDE 100 MG/10ML IV SOLN
INTRAVENOUS | Status: DC | PRN
  Administered 2013-07-11: 50 mg via INTRAVENOUS
  Administered 2013-07-11 (×2): 10 mg via INTRAVENOUS
  Administered 2013-07-11: 20 mg via INTRAVENOUS

## 2013-07-11 MED ORDER — SODIUM CHLORIDE 0.9 % IJ SOLN
INTRAMUSCULAR | Status: AC
  Filled 2013-07-11: qty 50

## 2013-07-11 MED ORDER — MIDAZOLAM HCL 2 MG/2ML IJ SOLN
INTRAMUSCULAR | Status: AC
  Filled 2013-07-11: qty 2

## 2013-07-11 MED ORDER — HYDROCODONE-ACETAMINOPHEN 5-325 MG PO TABS: 1.0000 | ORAL_TABLET | ORAL | Status: DC | PRN

## 2013-07-11 MED ORDER — FENTANYL CITRATE 0.05 MG/ML IJ SOLN
INTRAMUSCULAR | Status: DC | PRN
  Administered 2013-07-11 (×3): 50 ug via INTRAVENOUS
  Administered 2013-07-11: 100 ug via INTRAVENOUS

## 2013-07-11 MED ORDER — PROPOFOL 10 MG/ML IV EMUL
INTRAVENOUS | Status: AC
  Filled 2013-07-11: qty 20

## 2013-07-11 MED ORDER — ONDANSETRON HCL 4 MG/2ML IJ SOLN
INTRAMUSCULAR | Status: DC | PRN
  Administered 2013-07-11: 4 mg via INTRAVENOUS

## 2013-07-11 MED ORDER — NEOSTIGMINE METHYLSULFATE 1 MG/ML IJ SOLN
INTRAMUSCULAR | Status: DC | PRN
  Administered 2013-07-11: 4 mg via INTRAVENOUS

## 2013-07-11 MED ORDER — ONDANSETRON HCL 4 MG/2ML IJ SOLN
INTRAMUSCULAR | Status: AC
  Filled 2013-07-11: qty 2

## 2013-07-11 MED ORDER — PHENYLEPHRINE HCL 10 MG/ML IJ SOLN
INTRAMUSCULAR | Status: DC | PRN
  Administered 2013-07-11: 40 ug via INTRAVENOUS

## 2013-07-11 MED ORDER — KETOROLAC TROMETHAMINE 30 MG/ML IJ SOLN
INTRAMUSCULAR | Status: AC
  Filled 2013-07-11: qty 1

## 2013-07-11 MED ORDER — METHYLENE BLUE 1 % INJ SOLN
INTRAMUSCULAR | Status: DC | PRN
  Administered 2013-07-11: 1 mL

## 2013-07-11 MED ORDER — INSULIN ASPART 100 UNIT/ML ~~LOC~~ SOLN: 4.0000 [IU] | Freq: Once | SUBCUTANEOUS | Status: DC

## 2013-07-11 MED ORDER — HYDROMORPHONE HCL PF 1 MG/ML IJ SOLN
INTRAMUSCULAR | Status: AC
  Filled 2013-07-11: qty 1

## 2013-07-11 MED ORDER — GLYCINE 1.5 % IR SOLN
Status: DC | PRN
  Administered 2013-07-11: 3000 mL

## 2013-07-11 MED ORDER — NALBUPHINE HCL 10 MG/ML IJ SOLN: 10.0000 mg | INTRAMUSCULAR | Status: DC | PRN

## 2013-07-11 MED ORDER — DIPHENHYDRAMINE HCL 50 MG/ML IJ SOLN
12.5000 mg | Freq: Once | INTRAMUSCULAR | Status: AC
  Administered 2013-07-11: 12.5 mg via INTRAVENOUS

## 2013-07-11 MED ORDER — SODIUM CHLORIDE 0.9 % IJ SOLN
INTRAMUSCULAR | Status: AC
  Filled 2013-07-11: qty 10

## 2013-07-11 MED ORDER — FENTANYL CITRATE 0.05 MG/ML IJ SOLN: 25.0000 ug | INTRAMUSCULAR | Status: DC | PRN

## 2013-07-11 MED ORDER — CHLOROPROCAINE HCL 1 % IJ SOLN
INTRAMUSCULAR | Status: AC
  Filled 2013-07-11: qty 30

## 2013-07-11 MED ORDER — ROCURONIUM BROMIDE 100 MG/10ML IV SOLN
INTRAVENOUS | Status: AC
  Filled 2013-07-11: qty 1

## 2013-07-11 MED ORDER — ARTIFICIAL TEARS OP OINT
TOPICAL_OINTMENT | OPHTHALMIC | Status: AC
  Filled 2013-07-11: qty 3.5

## 2013-07-11 MED ORDER — NEOSTIGMINE METHYLSULFATE 1 MG/ML IJ SOLN
INTRAMUSCULAR | Status: AC
  Filled 2013-07-11: qty 1

## 2013-07-11 MED ORDER — FENTANYL CITRATE 0.05 MG/ML IJ SOLN
INTRAMUSCULAR | Status: AC
  Filled 2013-07-11: qty 2

## 2013-07-11 SURGICAL SUPPLY — 71 items
ADH SKN CLS APL DERMABOND .7 (GAUZE/BANDAGES/DRESSINGS) ×2
BAG URINE DRAINAGE (UROLOGICAL SUPPLIES) ×3 IMPLANT
BARRIER ADHS 3X4 INTERCEED (GAUZE/BANDAGES/DRESSINGS) ×3 IMPLANT
BRR ADH 4X3 ABS CNTRL BYND (GAUZE/BANDAGES/DRESSINGS) ×2
CANISTER SUCT 3000ML (MISCELLANEOUS) ×3 IMPLANT
CATH FOLEY 3WAY  5CC 16FR (CATHETERS) ×1
CATH FOLEY 3WAY 5CC 16FR (CATHETERS) ×2 IMPLANT
CATH ROBINSON RED A/P 16FR (CATHETERS) ×2 IMPLANT
CHLORAPREP W/TINT 26ML (MISCELLANEOUS) ×3 IMPLANT
CLOTH BEACON ORANGE TIMEOUT ST (SAFETY) ×3 IMPLANT
CONT PATH 16OZ SNAP LID 3702 (MISCELLANEOUS) ×3 IMPLANT
CONTAINER PREFILL 10% NBF 60ML (FORM) ×6 IMPLANT
COVER MAYO STAND STRL (DRAPES) ×3 IMPLANT
COVER TABLE BACK 60X90 (DRAPES) ×6 IMPLANT
COVER TIP SHEARS 8 DVNC (MISCELLANEOUS) ×2 IMPLANT
COVER TIP SHEARS 8MM DA VINCI (MISCELLANEOUS) ×1
DECANTER SPIKE VIAL GLASS SM (MISCELLANEOUS) ×3 IMPLANT
DERMABOND ADVANCED (GAUZE/BANDAGES/DRESSINGS) ×1
DERMABOND ADVANCED .7 DNX12 (GAUZE/BANDAGES/DRESSINGS) ×2 IMPLANT
DEVICE TROCAR PUNCTURE CLOSURE (ENDOMECHANICALS) IMPLANT
DRAPE HUG U DISPOSABLE (DRAPE) ×3 IMPLANT
DRAPE LG THREE QUARTER DISP (DRAPES) ×6 IMPLANT
DRAPE WARM FLUID 44X44 (DRAPE) ×3 IMPLANT
DRSG TELFA 3X8 NADH (GAUZE/BANDAGES/DRESSINGS) ×3 IMPLANT
ELECT REM PT RETURN 9FT ADLT (ELECTROSURGICAL) ×3
ELECTRODE REM PT RTRN 9FT ADLT (ELECTROSURGICAL) ×2 IMPLANT
EVACUATOR SMOKE 8.L (FILTER) ×3 IMPLANT
GAUZE VASELINE 3X9 (GAUZE/BANDAGES/DRESSINGS) IMPLANT
GLOVE BIOGEL PI IND STRL 7.0 (GLOVE) ×4 IMPLANT
GLOVE BIOGEL PI INDICATOR 7.0 (GLOVE) ×2
GLOVE ECLIPSE 6.5 STRL STRAW (GLOVE) ×3 IMPLANT
GOWN STRL REIN XL XLG (GOWN DISPOSABLE) ×18 IMPLANT
KIT ACCESSORY DA VINCI DISP (KITS) ×1
KIT ACCESSORY DVNC DISP (KITS) ×2 IMPLANT
LEGGING LITHOTOMY PAIR STRL (DRAPES) ×3 IMPLANT
LOOP ANGLED CUTTING 22FR (CUTTING LOOP) IMPLANT
NEEDLE INSUFFLATION 120MM (ENDOMECHANICALS) ×3 IMPLANT
NS IRRIG 1000ML POUR BTL (IV SOLUTION) ×9 IMPLANT
OCCLUDER COLPOPNEUMO (BALLOONS) ×1 IMPLANT
PACK HYSTEROSCOPY LF (CUSTOM PROCEDURE TRAY) ×3 IMPLANT
PACK LAVH (CUSTOM PROCEDURE TRAY) ×3 IMPLANT
PAD DRESSING TELFA 3X8 NADH (GAUZE/BANDAGES/DRESSINGS) ×2 IMPLANT
PAD OB MATERNITY 4.3X12.25 (PERSONAL CARE ITEMS) ×3 IMPLANT
PAD PREP 24X48 CUFFED NSTRL (MISCELLANEOUS) ×6 IMPLANT
PLUG CATH AND CAP STER (CATHETERS) ×3 IMPLANT
PROTECTOR NERVE ULNAR (MISCELLANEOUS) ×6 IMPLANT
SCISSORS LAP 5X35 DISP (ENDOMECHANICALS) IMPLANT
SET CYSTO W/LG BORE CLAMP LF (SET/KITS/TRAYS/PACK) IMPLANT
SET IRRIG TUBING LAPAROSCOPIC (IRRIGATION / IRRIGATOR) ×3 IMPLANT
SOLUTION ELECTROLUBE (MISCELLANEOUS) ×3 IMPLANT
SUT VIC AB 0 CT1 27 (SUTURE)
SUT VIC AB 0 CT1 27XBRD ANTBC (SUTURE) ×10 IMPLANT
SUT VICRYL 0 UR6 27IN ABS (SUTURE) ×3 IMPLANT
SUT VICRYL 4-0 PS2 18IN ABS (SUTURE) ×5 IMPLANT
SUT VLOC 180 0 6IN GS21 (SUTURE) ×1 IMPLANT
SUT VLOC 180 2-0 6IN GS21 (SUTURE) ×1 IMPLANT
SYR 50ML LL SCALE MARK (SYRINGE) ×3 IMPLANT
SYSTEM CONVERTIBLE TROCAR (TROCAR) IMPLANT
TIP UTERINE 5.1X6CM LAV DISP (MISCELLANEOUS) IMPLANT
TIP UTERINE 6.7X10CM GRN DISP (MISCELLANEOUS) IMPLANT
TIP UTERINE 6.7X6CM WHT DISP (MISCELLANEOUS) IMPLANT
TIP UTERINE 6.7X8CM BLUE DISP (MISCELLANEOUS) ×1 IMPLANT
TOWEL OR 17X24 6PK STRL BLUE (TOWEL DISPOSABLE) ×9 IMPLANT
TROCAR 12M 150ML BLUNT (TROCAR) IMPLANT
TROCAR DISP BLADELESS 8 DVNC (TROCAR) IMPLANT
TROCAR DISP BLADELESS 8MM (TROCAR) ×1
TROCAR OPTI TIP 12M 100M (ENDOMECHANICALS) ×3 IMPLANT
TROCAR Z-THREAD 12X150 (TROCAR) ×3 IMPLANT
TUBING FILTER THERMOFLATOR (ELECTROSURGICAL) ×3 IMPLANT
WARMER LAPAROSCOPE (MISCELLANEOUS) ×3 IMPLANT
WATER STERILE IRR 1000ML POUR (IV SOLUTION) ×3 IMPLANT

## 2013-07-11 NOTE — Anesthesia Postprocedure Evaluation (Signed)
  Anesthesia Post-op Note  Patient: Tiffany [name]  Procedure(s) Performed: Procedure(s): ROBOTIC ASSISTED LAPAROSCOPIC LYSIS OF ADHESIONS ; REPAIR OF UTERINE DEHISENCE (N/A) DILATATION AND CURETTAGE /HYSTEROSCOPY (N/A)  Patient Location: PACU  Anesthesia Type:General  Level of Consciousness: awake, alert  and oriented  Airway and Oxygen Therapy: Patient Spontanous Breathing  Post-op Pain: mild  Post-op Assessment: Post-op Vital signs reviewed, Patient's Cardiovascular Status Stable, Respiratory Function Stable, Patent Airway, No signs of Nausea or vomiting and Pain level controlled  Post-op Vital Signs: Reviewed and stable  Complications: No apparent anesthesia complications

## 2013-07-11 NOTE — Anesthesia Preprocedure Evaluation (Signed)
Anesthesia Evaluation  Patient identified by MRN, date of birth, ID band Patient awake    Reviewed: Allergy & Precautions, H&P , Patient's Chart, lab work & pertinent test results, reviewed documented beta blocker date and time   Airway Mallampati: II TM Distance: >3 FB Neck ROM: full    Dental no notable dental hx.    Pulmonary  breath sounds clear to auscultation  Pulmonary exam normal       Cardiovascular Rhythm:regular Rate:Normal     Neuro/Psych    GI/Hepatic   Endo/Other  diabetes, Type 1  Renal/GU      Musculoskeletal   Abdominal   Peds  Hematology   Anesthesia Other Findings Pt held all insulin for bowel prep and now has Bs of 323. We'll give her 5u insulin now.  Reproductive/Obstetrics                           Anesthesia Physical Anesthesia Plan  ASA: III  Anesthesia Plan: General   Post-op Pain Management:    Induction: Intravenous  Airway Management Planned: Oral ETT  Additional Equipment:   Intra-op Plan:   Post-operative Plan: Extubation in OR  Informed Consent: I have reviewed the patients History and Physical, chart, labs and discussed the procedure including the risks, benefits and alternatives for the proposed anesthesia with the patient or authorized representative who has indicated his/her understanding and acceptance.   Dental Advisory Given and Dental advisory given  Plan Discussed with: CRNA and Surgeon  Anesthesia Plan Comments: (  Discussed general anesthesia, including possible nausea, instrumentation of airway, sore throat,pulmonary aspiration, etc. I asked if the were any outstanding questions, or  concerns before we proceeded. )        Anesthesia Quick Evaluation

## 2013-07-11 NOTE — Brief Op Note (Signed)
07/11/2013  4:18 PM  PATIENT:  Tiffany [name]  42 y.o. female  PRE-OPERATIVE DIAGNOSIS:  Menorrhagia, Back Pain, Rule Out Pelvic Endometriosis    POST-OPERATIVE DIAGNOSIS:  menorrhagia, back pain, bladder/bowel adhesion, uterine dehiscence  PROCEDURE:  Procedure(s): ROBOTIC ASSISTED LAPAROSCOPIC LYSIS OF ADHESIONS  (N/A) DILATATION AND CURETTAGE /HYSTEROSCOPY (N/A), repair of uterine dehiscensce  SURGEON:  Surgeon(s) and Role:    * Serita Kyle, MD - Primary  PHYSICIAN ASSISTANT:   ASSISTANTS: Marlinda Mike, CNM                          Donette Larry, CNM  ANESTHESIA:   general and paracervical block FINDINGS; surgical separated fallopian tubes, nl ovaries. Bladder adherent to ant left uterus and ant abd wall, bowel adhesion on right, no evidence of endometriosis, uterine dehiscence noted with dissection of bladder off ant uterine wall, nl uterine cavity EBL:  Total I/O In: 1600 [I.V.:1600] Out: 325 [Urine:300; Blood:25]  BLOOD ADMINISTERED:none  DRAINS: none   LOCAL MEDICATIONS USED:  OTHER nesicaine  SPECIMEN:  Source of Specimen:  emc  DISPOSITION OF SPECIMEN:  PATHOLOGY  COUNTS:  YES  TOURNIQUET:  * No tourniquets in log *  DICTATION: .Other Dictation: Dictation Number [id number]  PLAN OF CARE: Discharge to home after PACU  PATIENT DISPOSITION:  PACU - hemodynamically stable.   Delay start of Pharmacological VTE agent (>24hrs) due to surgical blood loss or risk of bleeding: no

## 2013-07-11 NOTE — OR Nursing (Signed)
Dr Sherron Ales of anesthesia made aware of blood sugar through glucometer is 323. No orders presently

## 2013-07-11 NOTE — Transfer of Care (Signed)
Immediate Anesthesia Transfer of Care Note  Patient: Tiffany [name]  Procedure(s) Performed: Procedure(s): ROBOTIC ASSISTED LAPAROSCOPIC LYSIS OF ADHESIONS  (N/A) DILATATION AND CURETTAGE /HYSTEROSCOPY (N/A)  Patient Location: PACU  Anesthesia Type:General  Level of Consciousness: awake, alert  and oriented  Airway & Oxygen Therapy: Patient Spontanous Breathing and Patient connected to nasal cannula oxygen  Post-op Assessment: Report given to PACU RN and Post -op Vital signs reviewed and stable  Post vital signs: Reviewed and stable  Complications: No apparent anesthesia complications

## 2013-07-12 ENCOUNTER — Encounter (HOSPITAL_COMMUNITY): Payer: Self-pay | Admitting: Obstetrics and Gynecology

## 2013-07-13 NOTE — Op Note (Addendum)
NAME:  Tiffany [name], Tiffany [name]                ACCOUNT NO.:  [id number]  MEDICAL RECORD NO.:  [id number]  LOCATION:  [facility]                          FACILITY:  [facility]  PHYSICIAN:  Maxie Better, M.D.DATE OF BIRTH:  [birth year]  DATE OF PROCEDURE:  07/11/2013 DATE OF DISCHARGE:  07/11/2013                              OPERATIVE REPORT   PREOPERATIVE DIAGNOSES:  Menorrhagia rule out pelvic endometriosis, back/abdominal pain.  PROCEDURES:  Da Vinci robotic-assisted laparoscopic lysis of the adhesions of the bowel and bladder, diagnostic hysteroscopy, dilation & curettage, repair of uterine dehiscence.  POSTOPERATIVE DIAGNOSES:  Menorrhagia, back pain, abdominal pain, abdominopelvic adhesions.  ANESTHESIA:  General.  SURGEON:  Maxie Better, M.D.  ASSISTANTS:  Marlinda Mike, C.N.M. and Donette Larry, C.N.M.  DESCRIPTION OF PROCEDURE:  Under adequate general anesthesia, the patient was placed in the dorsal lithotomy position.  She was positioned for potential robotic surgery.  She was sterilely prepped and draped in usual fashion.  A 3-way Foley catheter was sterilely placed.  Bivalve speculum placed in the vagina.  A single-tooth tenaculum was placed on the anterior lip of the cervix.  A 20 mL of 1% Nesacaine was injected paracervically at 3 and 9 o'clock position.  The cervix was then gently dilated and a diagnostic hysteroscope was introduced into the uterine cavity.  Both tubal ostia could be seen.  Other than some areas of thickening posteriorly into the left of the uterus, no other lesions were noted.  The hysteroscope was removed.  The cavity was then gently curetted for moderate amount of tissue.  The acorn cannula was then introduced into the cervical os and attached to the tenaculum for manipulation of the uterus.  The bivalve speculum was then removed. Attention was then turned to the abdomen.  A 0.25% Marcaine was injected supraumbilically.  Supraumbilical incision was then  made.  Veress needle was carefully placed, tested, 2.6 L of CO2 was insufflated.  Veress needle was then removed.  A #12 mm trocar with sleeve was introduced into the abdomen without incident.  The robotic camera was then placed. The patient was then placed in Trendelenburg position.  A left lower quadrant incision was made, and under direct visualization, a #8 mm robotic port was placed under direct visualization.  The probe was then utilized to inspect the pelvis.  Normal liver edge was noted.  There was bladder adhesion to the left of the midline of the uterus and extending up to the anterior wall of the abdominal wall. There were normal ovaries  and surgical separations in both fallopian tubes.  There was no Allen-Masters windows.  There was no evidence of endometriosis posteriorly and on anterior cul de sac, the bladder was taken up the anterior space.  Retrograde filling of the bladder was done to further assess the extent of the bladder attachment to the uterus.  This was done, and then deflated.  The bladder extended up to about 2/3rd of the uterus on that left side.  Attempt to reach the husband to assess further, the extent of her pain with respect to the bladder area could not be accomplished, the husband had left the hospital, so a decision was then made to  proceed with removing that extensive bladder adhesion, so a second robotic port site was placed on the right and the assistant port was placed in the right upper quadrant.  The robot was docked to the patient's left side, after which the patient had been placed in Trendelenburg position.  In arm #1, the monopolar scissors was placed and in arm #2, the PK dissector was placed.  I then went to the surgical console, and again the bladder was retrograde filled with methylene stained fluid.  There were bowel adhesions on the right, which were then lysed.  The ureter was seen peristalsing on both sides.  Reinspection again showed  no endometriosis anywhere.  With the bladder retrograde filling, the adhesion was started proximally and laterally with sharp dissection.  A part of the proximal portion of the incision was removed. Decision was made to change to the uterine manipulator around the acorn cannula with the better movement of the uterus and this was at that point exchanged with a #8 uterine manipulator.  Once the uterine manipulator was placed, it was then noted that there was the balloon just inferior to where just a small piece of the adhesion of the bladder had been taken down, showing a defect in the uterus, probably covered by the bladder at the dehiscence.  The uterine manipulator was then removed and the acorn cannula reinserted and directed as the uterus was retroverted.  The bladder was filled.  The dissection off the anterior wall of the abdomen was done.  Releasing that adhesion, the bladder adhesion was then continued to be dissected off the lower uterine segment. The uterine dehiscence, that was noted in the uterus, which was almost an inch long.  Once the bladder was then deflated at that point, it was then noted that the bladder adhesion was then dissected off the uterus and displaced inferiorly.  That left the uterine defect to be closed.  At that point, 0 V-Loc and a 2-0 V-Loc sutures was placed, 0 Vicryl was used to close the deeper layer of the uterus and followed by the 2-0 V-Loc for subserosal closure of the surface.  Good hemostasis noted.  The abdomen was irrigated and suctioned.  Surface of the bleeder was cauterized.  Interceed was then placed over the suture site. The abdomen was partially  deflated to check for hemostasis.  Good hemostasis noted.  The robotic instruments were then removed.  The robot was undocked, then back to the patient's bedside, the needles were able to remove without undocking the robot and the robotic ports sites were then removed under direct visualization.   The incisions were closed with 4-0 Vicryl subcuticular stitch and 0 Vicryl figure-of-eight suture at the supraumbilical site for the fascia.  The instruments in the vagina was removed.  SPECIMENS:  Endometrial curettings, sent to Pathology.  ESTIMATED BLOOD LOSS:  30 mL.  INTRAOPERATIVE FLUID:  600 mL.  URINE OUTPUT:  300 mL.  COUNTS:  Sponge and instrument counts x2 were correct.  COMPLICATION:  None.  The patient tolerated the procedure well, was transferred to recovery room in stable condition.     Maxie Better, M.D.     [place]/MEDQ  D:  07/11/2013  T:  07/13/2013  Job:  [id number]

## 2013-08-03 DIAGNOSIS — K5900 Constipation, unspecified: Secondary | ICD-10-CM | POA: Insufficient documentation

## 2013-08-08 ENCOUNTER — Other Ambulatory Visit: Payer: Self-pay | Admitting: *Deleted

## 2013-08-08 ENCOUNTER — Ambulatory Visit
Admission: RE | Admit: 2013-08-08 | Discharge: 2013-08-08 | Disposition: A | Discharge status: Home / Self Care | Payer: BC Managed Care – PPO | Source: Ambulatory Visit | Attending: *Deleted | Admitting: *Deleted

## 2013-08-08 DIAGNOSIS — K5900 Constipation, unspecified: Secondary | ICD-10-CM

## 2013-08-18 DIAGNOSIS — M6289 Other specified disorders of muscle: Secondary | ICD-10-CM | POA: Insufficient documentation

## 2013-08-22 ENCOUNTER — Ambulatory Visit: Payer: BC Managed Care – PPO | Attending: Physician Assistant | Admitting: Physical Therapy

## 2013-08-22 DIAGNOSIS — IMO0001 Reserved for inherently not codable concepts without codable children: Secondary | ICD-10-CM | POA: Insufficient documentation

## 2013-08-22 DIAGNOSIS — M545 Low back pain, unspecified: Secondary | ICD-10-CM | POA: Insufficient documentation

## 2013-08-22 DIAGNOSIS — E119 Type 2 diabetes mellitus without complications: Secondary | ICD-10-CM | POA: Insufficient documentation

## 2013-08-22 DIAGNOSIS — M069 Rheumatoid arthritis, unspecified: Secondary | ICD-10-CM | POA: Insufficient documentation

## 2013-08-22 DIAGNOSIS — K5900 Constipation, unspecified: Secondary | ICD-10-CM | POA: Diagnosis not present

## 2013-08-23 ENCOUNTER — Ambulatory Visit: Payer: BC Managed Care – PPO | Admitting: Physical Therapy

## 2013-08-23 DIAGNOSIS — IMO0001 Reserved for inherently not codable concepts without codable children: Secondary | ICD-10-CM | POA: Diagnosis not present

## 2013-08-27 ENCOUNTER — Encounter (HOSPITAL_COMMUNITY): Payer: Self-pay | Admitting: Emergency Medicine

## 2013-08-27 ENCOUNTER — Emergency Department (HOSPITAL_COMMUNITY): Payer: BC Managed Care – PPO

## 2013-08-27 ENCOUNTER — Other Ambulatory Visit: Payer: Self-pay

## 2013-08-27 ENCOUNTER — Emergency Department (HOSPITAL_COMMUNITY)
Admission: EM | Admit: 2013-08-27 | Discharge: 2013-08-27 | Discharge status: Against Medical Advice | Payer: BC Managed Care – PPO | Attending: Emergency Medicine | Admitting: Emergency Medicine

## 2013-08-27 DIAGNOSIS — Z87442 Personal history of urinary calculi: Secondary | ICD-10-CM | POA: Insufficient documentation

## 2013-08-27 DIAGNOSIS — Z79899 Other long term (current) drug therapy: Secondary | ICD-10-CM | POA: Insufficient documentation

## 2013-08-27 DIAGNOSIS — M545 Low back pain, unspecified: Secondary | ICD-10-CM | POA: Insufficient documentation

## 2013-08-27 DIAGNOSIS — M25519 Pain in unspecified shoulder: Secondary | ICD-10-CM | POA: Insufficient documentation

## 2013-08-27 DIAGNOSIS — F329 Major depressive disorder, single episode, unspecified: Secondary | ICD-10-CM | POA: Insufficient documentation

## 2013-08-27 DIAGNOSIS — F3289 Other specified depressive episodes: Secondary | ICD-10-CM | POA: Insufficient documentation

## 2013-08-27 DIAGNOSIS — Z794 Long term (current) use of insulin: Secondary | ICD-10-CM | POA: Insufficient documentation

## 2013-08-27 DIAGNOSIS — R0789 Other chest pain: Secondary | ICD-10-CM | POA: Insufficient documentation

## 2013-08-27 DIAGNOSIS — R079 Chest pain, unspecified: Secondary | ICD-10-CM

## 2013-08-27 DIAGNOSIS — R1084 Generalized abdominal pain: Secondary | ICD-10-CM | POA: Insufficient documentation

## 2013-08-27 DIAGNOSIS — Z3202 Encounter for pregnancy test, result negative: Secondary | ICD-10-CM | POA: Insufficient documentation

## 2013-08-27 DIAGNOSIS — R011 Cardiac murmur, unspecified: Secondary | ICD-10-CM | POA: Insufficient documentation

## 2013-08-27 DIAGNOSIS — E119 Type 2 diabetes mellitus without complications: Secondary | ICD-10-CM | POA: Insufficient documentation

## 2013-08-27 DIAGNOSIS — Z8719 Personal history of other diseases of the digestive system: Secondary | ICD-10-CM | POA: Insufficient documentation

## 2013-08-27 DIAGNOSIS — IMO0002 Reserved for concepts with insufficient information to code with codable children: Secondary | ICD-10-CM

## 2013-08-27 DIAGNOSIS — M1710 Unilateral primary osteoarthritis, unspecified knee: Secondary | ICD-10-CM | POA: Insufficient documentation

## 2013-08-27 DIAGNOSIS — E785 Hyperlipidemia, unspecified: Secondary | ICD-10-CM | POA: Insufficient documentation

## 2013-08-27 DIAGNOSIS — R000 Tachycardia, unspecified: Secondary | ICD-10-CM | POA: Insufficient documentation

## 2013-08-27 HISTORY — DX: Gastroparesis: K31.84

## 2013-08-27 LAB — URINE MICROSCOPIC-ADD ON

## 2013-08-27 LAB — URINALYSIS, ROUTINE W REFLEX MICROSCOPIC
Bilirubin Urine: NEGATIVE
Glucose, UA: >1000 mg/dL — AB
Hgb urine dipstick: NEGATIVE
Ketones, ur: NEGATIVE mg/dL
Leukocytes, UA: NEGATIVE
Nitrite: NEGATIVE
Protein, ur: NEGATIVE mg/dL
Specific Gravity, Urine: 1.026 (ref 1.005–1.030)
Urobilinogen, UA: 0.2 mg/dL (ref 0.0–1.0)
pH: 7.5 (ref 5.0–8.0)

## 2013-08-27 LAB — POCT PREGNANCY, URINE: Preg Test, Ur: NEGATIVE

## 2013-08-27 MED ORDER — GI COCKTAIL ~~LOC~~
30.0000 mL | Freq: Once | ORAL | Status: AC
  Administered 2013-08-27: 30 mL via ORAL
  Filled 2013-08-27: qty 30

## 2013-08-27 MED ORDER — HYDROMORPHONE HCL PF 1 MG/ML IJ SOLN
0.5000 mg | Freq: Once | INTRAMUSCULAR | Status: DC
  Filled 2013-08-27: qty 1

## 2013-08-27 MED ORDER — PANTOPRAZOLE SODIUM 40 MG IV SOLR
40.0000 mg | Freq: Once | INTRAVENOUS | Status: DC
  Filled 2013-08-27: qty 40

## 2013-08-27 MED ORDER — ASPIRIN 81 MG PO CHEW
324.0000 mg | CHEWABLE_TABLET | Freq: Once | ORAL | Status: AC
  Administered 2013-08-27: 324 mg via ORAL
  Filled 2013-08-27: qty 4

## 2013-08-27 MED ORDER — DIPHENHYDRAMINE HCL 25 MG PO CAPS
25.0000 mg | ORAL_CAPSULE | Freq: Once | ORAL | Status: AC
  Administered 2013-08-27: 25 mg via ORAL
  Filled 2013-08-27: qty 1

## 2013-08-27 NOTE — ED Notes (Signed)
Santiago Glad RN attempted IV x1 with pt. No success.

## 2013-08-27 NOTE — ED Notes (Addendum)
Attempted IV x 1 with no success. IV team paged.

## 2013-08-27 NOTE — ED Notes (Signed)
Belongings with pt when she left.

## 2013-08-27 NOTE — ED Notes (Signed)
Pt. reports left chest pain radiating to left arm and upper back onset this evening , denies SOB /cough or nausea.

## 2013-08-27 NOTE — ED Provider Notes (Signed)
CSN: [id number]     Arrival date & time 08/27/13  0052 History   First MD Initiated Contact with Patient 08/27/13 0101     Chief Complaint  Patient presents with  . Chest Pain   (Consider location/radiation/quality/duration/timing/severity/associated sxs/prior Treatment) HPI History provided by pt.   Pt has lupus, for which she takes plaquenil, as well as diabetes and hyperlipidemia.  Developed tightness in center of chest as well as pain in L shoulder, that she describes as sensation of being punched, 2 hours ago.  Associated w/ tachycardia and L low back pain.  Sx intermittent and seem to improve w/ sitting upright.  Non-exertional.  Denies fever, cough, SOB, diaphoresis, nausea and urinary sx.  No recent trauma.  No h/o acid reflux.  Ate spicy food for dinner and drank a small amount of wine.  Has chronic and stable abdominal pain that has been attributed to gastroparesis.  No h/o pericarditis.  Past Medical History  Diagnosis Date  . Diabetes mellitus   . Lupus   . Connective tissue disease   . Heart murmur     as a child - no problem as adult  . Hyperlipidemia   . Depression   . History of kidney stones     no surgery  . Headache(784.0)   . Arthritis     knees, lower back right side  . Gastroparesis    Past Surgical History  Procedure Laterality Date  . Cesarean section  1994, 2006    x 2  . Colonoscopy with propofol N/A 03/16/2013    Procedure: COLONOSCOPY WITH PROPOFOL;  Surgeon: Willis Modena, MD;  Location: [facility];  Service: Endoscopy;  Laterality: N/A;  . Tubal ligation    . Robotic assisted laparoscopic lysis of adhesion N/A 07/11/2013    Procedure: ROBOTIC ASSISTED LAPAROSCOPIC LYSIS OF ADHESIONS ; REPAIR OF UTERINE DEHISENCE;  Surgeon: Serita Kyle, MD;  Location: [facility];  Service: Gynecology;  Laterality: N/A;  . Hysteroscopy w/d&c N/A 07/11/2013    Procedure: DILATATION AND CURETTAGE /HYSTEROSCOPY;  Surgeon: Serita Kyle, MD;  Location: [facility];   Service: Gynecology;  Laterality: N/A;   No family history on file. History  Substance Use Topics  . Smoking status: Never Smoker   . Smokeless tobacco: Not on file  . Alcohol Use: No   OB History   Grav Para Term Preterm Abortions TAB SAB Ect Mult Living                 Review of Systems  All other systems reviewed and are negative.    Allergies  Morphine and related and Tramadol  Home Medications   Current Outpatient Rx  Name  Route  Sig  Dispense  Refill  . amitriptyline (ELAVIL) 10 MG tablet   Oral   Take 10 mg by mouth at bedtime as needed for sleep.          Marland Kitchen atorvastatin (LIPITOR) 40 MG tablet   Oral   Take 40 mg by mouth every morning.          . bisacodyl (DULCOLAX) 5 MG EC tablet   Oral   Take 5 mg by mouth daily as needed for constipation.         Marland Kitchen HYDROcodone-acetaminophen (NORCO/VICODIN) 5-325 MG per tablet   Oral   Take 2 tablets by mouth every 4 (four) hours as needed for pain.   10 tablet   0   . HYDROcodone-acetaminophen (NORCO/VICODIN) 5-325 MG per tablet  Oral   Take 1-2 tablets by mouth every 4 (four) hours as needed for moderate pain.   30 tablet   0   . hydroxychloroquine (PLAQUENIL) 200 MG tablet   Oral   Take 200 mg by mouth every morning.          Marland Kitchen ibuprofen (ADVIL,MOTRIN) 800 MG tablet   Oral   Take 1 tablet (800 mg total) by mouth every 8 (eight) hours as needed.   30 tablet   0   . insulin glargine (LANTUS) 100 UNIT/ML injection   Subcutaneous   Inject 10 Units into the skin 2 (two) times daily.          . insulin glulisine (APIDRA) 100 UNIT/ML injection   Subcutaneous   Inject 10-16 Units into the skin 3 (three) times daily before meals.          . lubiprostone (AMITIZA) 24 MCG capsule   Oral   Take 48 mcg by mouth daily.         Marland Kitchen zolpidem (AMBIEN CR) 12.5 MG CR tablet   Oral   Take 1 tablet (12.5 mg total) by mouth at bedtime as needed for sleep.   30 tablet   0     LMP  08/10/2013 Physical Exam  Nursing note and vitals reviewed. Constitutional: She is oriented to person, place, and time. She appears well-developed and well-nourished. No distress.  HENT:  Head: Normocephalic and atraumatic.  Eyes:  Normal appearance  Neck: Normal range of motion.  Cardiovascular: Normal rate, regular rhythm and intact distal pulses.   Pulmonary/Chest: Effort normal and breath sounds normal. No respiratory distress. She exhibits no tenderness.  No pleuritic pain reported  Abdominal: Soft. Bowel sounds are normal. She exhibits no distension. There is no guarding.  Diffuse, moderate abd ttp  Musculoskeletal: Normal range of motion.  L shoulder ttp and pain in same location w/ passive ROM of shoulder.  No peripheral edema or calf tenderness  Neurological: She is alert and oriented to person, place, and time.  Skin: Skin is warm and dry. No rash noted.  Psychiatric: She has a normal mood and affect. Her behavior is normal.    ED Course  Procedures (including critical care time) Labs Review Labs Reviewed  URINALYSIS, ROUTINE W REFLEX MICROSCOPIC - Abnormal; Notable for the following:    Glucose, UA >1000 (*)    All other components within normal limits  URINE MICROSCOPIC-ADD ON - Abnormal; Notable for the following:    Squamous Epithelial / LPF FEW (*)    Bacteria, UA FEW (*)    All other components within normal limits  POCT PREGNANCY, URINE   Date: 08/27/2013  Rate: 105  Rhythm: sinus tachycardia  QRS Axis: normal  Intervals: normal  ST/T Wave abnormalities: normal  Conduction Disutrbances:none  Narrative Interpretation:   Old EKG Reviewed: unchanged   Imaging Review Dg Chest 2 View  08/27/2013   CLINICAL DATA:  Chest pain  EXAM: CHEST  2 VIEW  COMPARISON:  11/28/2011  FINDINGS: Normal heart size and mediastinal contours. No acute infiltrate or edema. No effusion or pneumothorax. No acute osseous findings. Bilateral cervical ribs.  IMPRESSION: No active  cardiopulmonary disease.   Electronically Signed   By: Jorje Guild M.D.   On: 08/27/2013 01:57    EKG Interpretation   None       MDM  No diagnosis found. 43yo F w/ lupus, diabetes and hyperlipidemia presents w/ intermittent chest and L shoulder pain x 2 hours.  Currently asx.  Suspect acid reflux; pain non-exertional, aggravated by laying flat, no associated fever/cough/SOB, ate spicy food and drank alcohol prior to onset, shoulder pain reproducible.  However because of her h/o lupus, she is at increased risk for both ACS and PE.  Had laparoscopic abdominal surgery 1 month ago also.  On exam, afebrile, mildly tachycardic, uncomfortable appearing (attributes to L low back pain), shoulder pain reproducible but chest pain is not, no sign of DVT.  EKG shows sinus tachycardia.  Labs, including d-dimer and troponin pending. Pt to receive 0.5mg  IV dilaudid and aspirin.  1:58 AM   Per nursing staff, patient left AMA because she did not want to be poked with anymore needles.  Myself and Dr. Marnette Burgess were not made aware until after patient had left.    Remer Macho, PA-C 08/27/13 [phone]

## 2013-08-27 NOTE — ED Notes (Signed)
IV team made aware

## 2013-08-27 NOTE — ED Notes (Signed)
Pt stated that she wanted to leave AMA. Pt made aware that she should wait to  Discuss with EDP. Pt refuses stating that she does not want to get an IV anymore. Offered to discuss further with EDP and pt. Pt continues to refuse. Pt signed AMA and left. She was alert and oriented x4. Pt stated that she is fine. Pt ambulatory.

## 2013-08-27 NOTE — ED Notes (Addendum)
Pt Left AMA. Quarry manager with member, as well, and pt still wanted to leave AMA. Attempted to contact PA x 2 via phone. No answer. When to get PA or EDP and pt already left.

## 2013-08-29 ENCOUNTER — Ambulatory Visit: Payer: BC Managed Care – PPO | Admitting: Physical Therapy

## 2013-08-30 ENCOUNTER — Ambulatory Visit: Payer: BC Managed Care – PPO | Attending: Physician Assistant | Admitting: Physical Therapy

## 2013-08-30 DIAGNOSIS — E1143 Type 2 diabetes mellitus with diabetic autonomic (poly)neuropathy: Secondary | ICD-10-CM | POA: Insufficient documentation

## 2013-08-30 DIAGNOSIS — K3184 Gastroparesis: Secondary | ICD-10-CM | POA: Insufficient documentation

## 2013-08-30 DIAGNOSIS — M545 Low back pain, unspecified: Secondary | ICD-10-CM | POA: Insufficient documentation

## 2013-08-30 DIAGNOSIS — K5900 Constipation, unspecified: Secondary | ICD-10-CM | POA: Insufficient documentation

## 2013-08-30 DIAGNOSIS — M069 Rheumatoid arthritis, unspecified: Secondary | ICD-10-CM | POA: Insufficient documentation

## 2013-08-30 DIAGNOSIS — E119 Type 2 diabetes mellitus without complications: Secondary | ICD-10-CM | POA: Insufficient documentation

## 2013-08-30 DIAGNOSIS — IMO0001 Reserved for inherently not codable concepts without codable children: Secondary | ICD-10-CM | POA: Insufficient documentation

## 2013-08-30 NOTE — ED Provider Notes (Signed)
Medical screening examination/treatment/procedure(s) were performed by non-physician practitioner and as supervising physician I was immediately available for consultation/collaboration.  EKG Interpretation    Date/Time:  Saturday August 27 2013 00:57:04 EST Ventricular Rate:  105 PR Interval:  122 QRS Duration: 72 QT Interval:  318 QTC Calculation: 420 R Axis:   59 Text Interpretation:  Sinus tachycardia Otherwise normal ECG ED PHYSICIAN INTERPRETATION AVAILABLE IN [facility] Confirmed by TEST, RECORD ([id number]) on 08/29/2013 9:13:18 AM             Teressa Lower, MD 08/30/13 2334

## 2013-09-05 ENCOUNTER — Ambulatory Visit: Payer: BC Managed Care – PPO | Admitting: Physical Therapy

## 2013-09-06 ENCOUNTER — Ambulatory Visit: Payer: BC Managed Care – PPO | Admitting: Physical Therapy

## 2014-07-20 ENCOUNTER — Ambulatory Visit: Payer: BC Managed Care – PPO | Admitting: Internal Medicine

## 2014-09-18 ENCOUNTER — Other Ambulatory Visit: Payer: Self-pay

## 2014-09-18 DIAGNOSIS — Z1231 Encounter for screening mammogram for malignant neoplasm of breast: Secondary | ICD-10-CM

## 2014-09-21 ENCOUNTER — Other Ambulatory Visit: Payer: Self-pay | Admitting: Obstetrics and Gynecology

## 2014-09-21 ENCOUNTER — Other Ambulatory Visit: Payer: Self-pay

## 2014-09-21 DIAGNOSIS — N644 Mastodynia: Secondary | ICD-10-CM

## 2014-10-02 ENCOUNTER — Ambulatory Visit
Admission: RE | Admit: 2014-10-02 | Discharge: 2014-10-02 | Disposition: A | Discharge status: Home / Self Care | Payer: BLUE CROSS/BLUE SHIELD | Source: Ambulatory Visit | Attending: Obstetrics and Gynecology | Admitting: Obstetrics and Gynecology

## 2014-10-02 DIAGNOSIS — N644 Mastodynia: Secondary | ICD-10-CM

## 2014-12-17 ENCOUNTER — Encounter (HOSPITAL_COMMUNITY): Payer: Self-pay | Admitting: *Deleted

## 2014-12-17 ENCOUNTER — Emergency Department (HOSPITAL_COMMUNITY)
Admission: EM | Admit: 2014-12-17 | Discharge: 2014-12-17 | Disposition: A | Discharge status: Home / Self Care | Payer: BLUE CROSS/BLUE SHIELD | Attending: Emergency Medicine | Admitting: Emergency Medicine

## 2014-12-17 DIAGNOSIS — Z79899 Other long term (current) drug therapy: Secondary | ICD-10-CM | POA: Insufficient documentation

## 2014-12-17 DIAGNOSIS — E119 Type 2 diabetes mellitus without complications: Secondary | ICD-10-CM | POA: Diagnosis not present

## 2014-12-17 DIAGNOSIS — Z794 Long term (current) use of insulin: Secondary | ICD-10-CM | POA: Diagnosis not present

## 2014-12-17 DIAGNOSIS — M792 Neuralgia and neuritis, unspecified: Secondary | ICD-10-CM

## 2014-12-17 DIAGNOSIS — E785 Hyperlipidemia, unspecified: Secondary | ICD-10-CM | POA: Diagnosis not present

## 2014-12-17 DIAGNOSIS — R011 Cardiac murmur, unspecified: Secondary | ICD-10-CM | POA: Insufficient documentation

## 2014-12-17 DIAGNOSIS — R21 Rash and other nonspecific skin eruption: Secondary | ICD-10-CM | POA: Diagnosis present

## 2014-12-17 DIAGNOSIS — Z8619 Personal history of other infectious and parasitic diseases: Secondary | ICD-10-CM | POA: Insufficient documentation

## 2014-12-17 DIAGNOSIS — Z8719 Personal history of other diseases of the digestive system: Secondary | ICD-10-CM | POA: Diagnosis not present

## 2014-12-17 DIAGNOSIS — Z87442 Personal history of urinary calculi: Secondary | ICD-10-CM | POA: Diagnosis not present

## 2014-12-17 DIAGNOSIS — Z8659 Personal history of other mental and behavioral disorders: Secondary | ICD-10-CM | POA: Insufficient documentation

## 2014-12-17 MED ORDER — VALACYCLOVIR HCL 1 G PO TABS: 1000.0000 mg | ORAL_TABLET | Freq: Three times a day (TID) | ORAL | Status: AC

## 2014-12-17 MED ORDER — VALACYCLOVIR HCL 500 MG PO TABS
1000.0000 mg | ORAL_TABLET | Freq: Once | ORAL | Status: AC
  Administered 2014-12-17: 1000 mg via ORAL
  Filled 2014-12-17: qty 2

## 2014-12-17 NOTE — Discharge Instructions (Signed)
Please read and follow all provided instructions.  Your diagnoses today include:  1. Neuropathic pain     Tests performed today include:  Vital signs. See below for your results today.   Medications prescribed:   Valtrex - medication for shingles  Home care instructions:  Follow any educational materials contained in this packet.  Follow-up instructions: Please follow-up with your primary care provider as needed for further evaluation of your symptoms.  Return instructions:   Please return to the Emergency Department if you experience worsening symptoms.   Please return if you have any other emergent concerns.  Additional Information:  Your vital signs today were: BP 147/76 mmHg   Pulse 98   Temp(Src) 98.7 F (37.1 C) (Oral)   Resp 20   Ht 5\' 7"  (1.702 m)   Wt 143 lb (64.864 kg)   BMI 22.39 kg/m2   SpO2 99% If your blood pressure (BP) was elevated above 135/85 this visit, please have this repeated by your doctor within one month. ---------------

## 2014-12-17 NOTE — ED Notes (Signed)
Provider at the bedside.  

## 2014-12-17 NOTE — ED Notes (Signed)
Waiting on medication to come from pharmacy to discharge patient.

## 2014-12-17 NOTE — ED Notes (Signed)
Pt in stating she is concerned she has shingles, c/o pain to right side of neck and rash, no rash visible to RN, pt states pain started today, no distress noted

## 2014-12-17 NOTE — ED Provider Notes (Signed)
CSN: [id number]     Arrival date & time 12/17/14  2138 History   First MD Initiated Contact with Patient 12/17/14 2149     Chief Complaint  Patient presents with  . Neck Pain  . Rash     (Consider location/radiation/quality/duration/timing/severity/associated sxs/prior Treatment) HPI Comments: Patient with history of lupus, shingles presents with complaint of burning pain in the skin on the right side of her neck which began at approximately 1 PM today. Patient states that the area is very tender to even light touch. She describes the pain as "burning". She states that she noted 2 bumps on her skin. She has had shingles on the left side of her face in the past and feels that she could be developing shingles on her neck. Patient is on plaquenil with no for her lupus. She also has a history of diabetes. Patient states that she was recently on a course of antibiotics for her gastroparesis and for dental cleaning (h/o heart murmur). No recent significant infections.  Patient is a 44 y.o. female presenting with neck pain and rash. The history is provided by the patient and medical records.  Neck Pain Associated symptoms: no chest pain and no fever   Rash Associated symptoms: no fever, no myalgias, no nausea, no shortness of breath, not vomiting and not wheezing     Past Medical History  Diagnosis Date  . Diabetes mellitus   . Lupus   . Connective tissue disease   . Heart murmur     as a child - no problem as adult  . Hyperlipidemia   . Depression   . History of kidney stones     no surgery  . Headache(784.0)   . Arthritis     knees, lower back right side  . Gastroparesis    Past Surgical History  Procedure Laterality Date  . Cesarean section  1994, 2006    x 2  . Colonoscopy with propofol N/A 03/16/2013    Procedure: COLONOSCOPY WITH PROPOFOL;  Surgeon: Arta Silence, MD;  Location: [facility];  Service: Endoscopy;  Laterality: N/A;  . Tubal ligation    . Robotic assisted  laparoscopic lysis of adhesion N/A 07/11/2013    Procedure: ROBOTIC ASSISTED LAPAROSCOPIC LYSIS OF ADHESIONS ; REPAIR OF UTERINE DEHISENCE;  Surgeon: Marvene Staff, MD;  Location: [facility];  Service: Gynecology;  Laterality: N/A;  . Hysteroscopy w/d&c N/A 07/11/2013    Procedure: DILATATION AND CURETTAGE /HYSTEROSCOPY;  Surgeon: Marvene Staff, MD;  Location: [facility];  Service: Gynecology;  Laterality: N/A;   History reviewed. No pertinent family history. History  Substance Use Topics  . Smoking status: Never Smoker   . Smokeless tobacco: Not on file  . Alcohol Use: No   OB History    No data available     Review of Systems  Constitutional: Negative for fever.  HENT: Negative for facial swelling and trouble swallowing.   Eyes: Negative for redness.  Respiratory: Negative for shortness of breath, wheezing and stridor.   Cardiovascular: Negative for chest pain.  Gastrointestinal: Negative for nausea and vomiting.  Musculoskeletal: Positive for neck pain. Negative for myalgias.  Skin: Positive for rash.  Neurological: Negative for light-headedness.  Psychiatric/Behavioral: Negative for confusion.      Allergies  Morphine and related and Tramadol  Home Medications   Prior to Admission medications   Medication Sig Start Date End Date Taking? Authorizing Provider  amitriptyline (ELAVIL) 10 MG tablet Take 10 mg by mouth at bedtime as  needed for sleep.     Historical Provider, MD  atorvastatin (LIPITOR) 40 MG tablet Take 40 mg by mouth every morning.     Historical Provider, MD  hydroxychloroquine (PLAQUENIL) 200 MG tablet Take 200 mg by mouth every morning.     Historical Provider, MD  insulin glargine (LANTUS) 100 UNIT/ML injection Inject 30 Units into the skin at bedtime.     Historical Provider, MD  insulin glulisine (APIDRA) 100 UNIT/ML injection Inject 8 Units into the skin 3 (three) times daily before meals.     Historical Provider, MD  lubiprostone (AMITIZA) 24  MCG capsule Take 48 mcg by mouth daily.    Historical Provider, MD  metoCLOPramide (REGLAN) 5 MG/5ML solution Take 5 mg by mouth 4 (four) times daily. 08/22/13 09/21/13  Historical Provider, MD  valACYclovir (VALTREX) 1000 MG tablet Take 1 tablet (1,000 mg total) by mouth 3 (three) times daily. 12/17/14 12/31/14  Carlisle Cater, PA-C  zolpidem (AMBIEN CR) 12.5 MG CR tablet Take 1 tablet (12.5 mg total) by mouth at bedtime as needed for sleep. 07/11/13   Sheronette Cousins, MD   BP 136/71 mmHg  Pulse 88  Temp(Src) 97.8 F (36.6 C) (Oral)  Resp 8  Ht 5\' 7"  (1.702 m)  Wt 143 lb (64.864 kg)  BMI 22.39 kg/m2  SpO2 99%   Physical Exam  Constitutional: She appears well-developed and well-nourished.  HENT:  Head: Normocephalic and atraumatic.  Eyes: Conjunctivae are normal.  Neck: Normal range of motion. Neck supple.  Pulmonary/Chest: No respiratory distress.  Neurological: She is alert. A sensory deficit is present.  Patient with pain to light touch of the right lateral neck, not crossing the midline, in a dermatomal distribution. No obvious vesicular rash at this time.  Skin: Skin is warm and dry.  Psychiatric: She has a normal mood and affect.  Nursing note and vitals reviewed.   ED Course  Procedures (including critical care time) Labs Review Labs Reviewed - No data to display  Imaging Review No results found.   EKG Interpretation None       10:13 PM Patient seen and examined. Medications ordered.   Vital signs reviewed and are as follows: BP 136/71 mmHg  Pulse 88  Temp(Src) 97.8 F (36.6 C) (Oral)  Resp 8  Ht 5\' 7"  (1.702 m)  Wt 143 lb (64.864 kg)  BMI 22.39 kg/m2  SpO2 99%  Encouraged patient to follow-up with her PCP in 3 days for recheck. If no rash appears, she may be able to d/c Valtrex.    MDM   Final diagnoses:  Neuropathic pain   Patient with history of shingles, on immunosuppressive therapy for her lupus, with possible prodrome to shingles rash. Feel  that it is likely enough that her symptoms are due to shingles that treatment is indicated at this time. Will start patient on Valtrex 3 times a day. No history of kidney or liver issues.     Carlisle Cater, PA-C 12/17/14 2230  Carmin Muskrat, MD 12/18/14 [phone]

## 2015-01-24 IMAGING — CT CT ABD-PELV W/ CM
2 of 5 series · 14 of 46 positions shown, 16 images · IV contrast (APPLIED)
Comparison: CT of the abdomen pelvis 05/25/2012.

CLINICAL DATA: Back and abdominal pain.

CT ABDOMEN AND PELVIS WITH CONTRAST
TECHNIQUE: Multidetector CT imaging of the abdomen and pelvis was
performed following the standard protocol during bolus
administration of intravenous contrast.
Contrast: 100mL OMNIPAQUE IOHEXOL 300 MG/ML  SOLN

[Series 2: abd/ pelvis 5.0 i30f 1 · axial · 0.75mm/px · z∈[+839,+1254]mm · 11 of 93 slices shown, 13 images]
[im 5/93  soft-tissue]
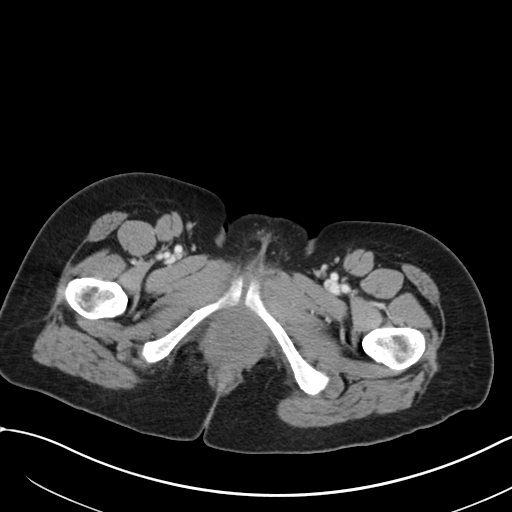
[im 5/93  bone]
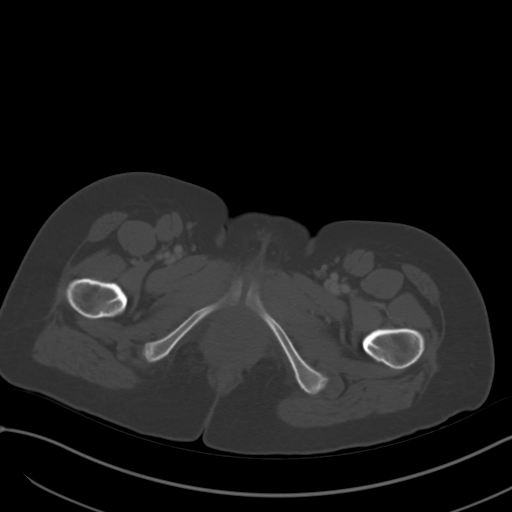
[im 15/93  soft-tissue]
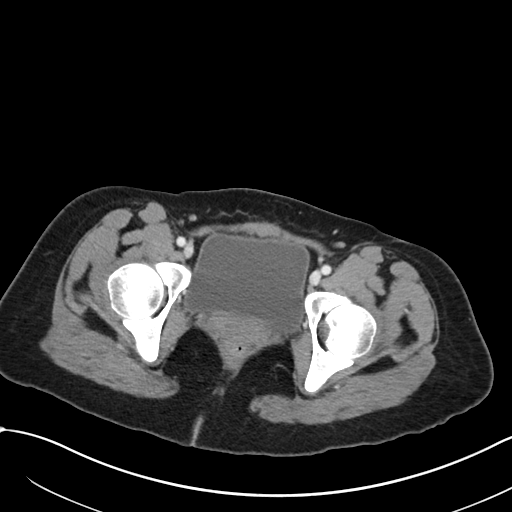
[im 25/93  soft-tissue]
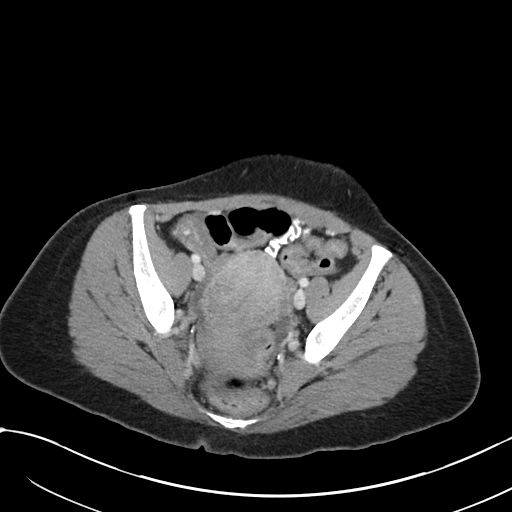
[im 30/93  soft-tissue]
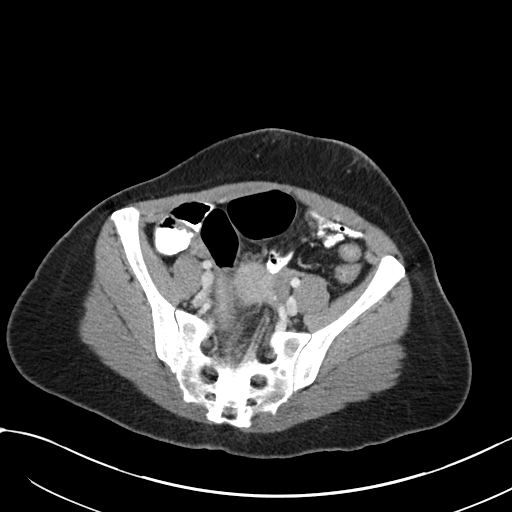
[im 39/93  soft-tissue]
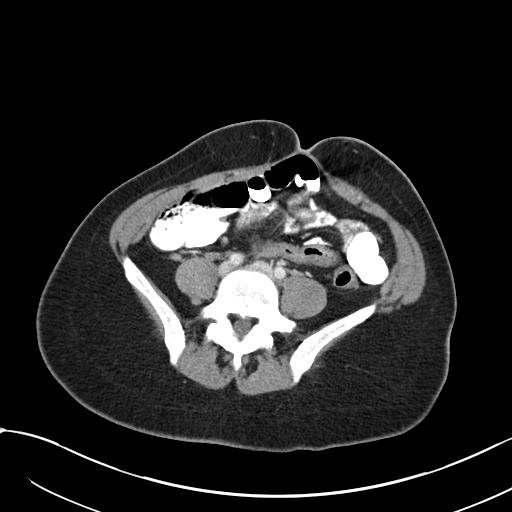
[im 49/93  soft-tissue]
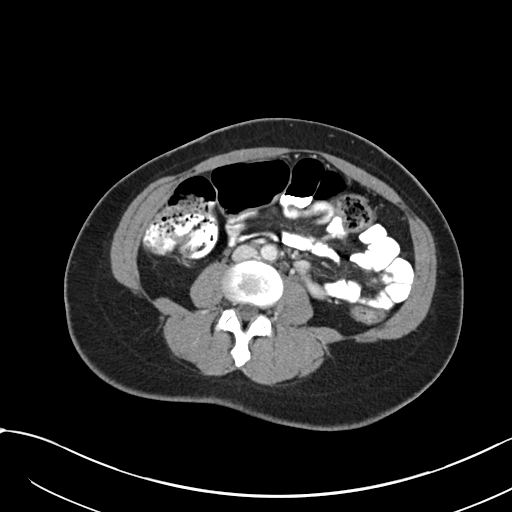
[im 54/93  soft-tissue]
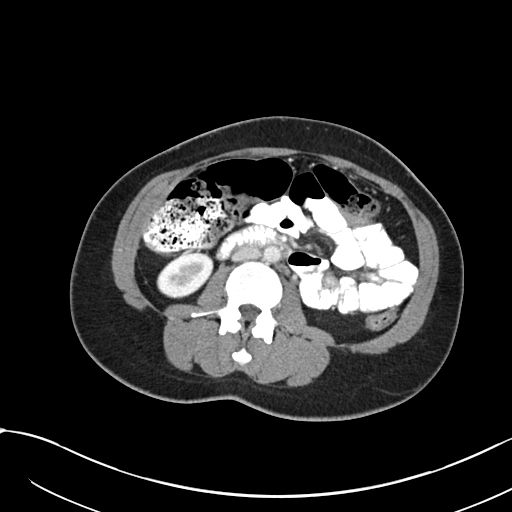
[im 63/93  soft-tissue]
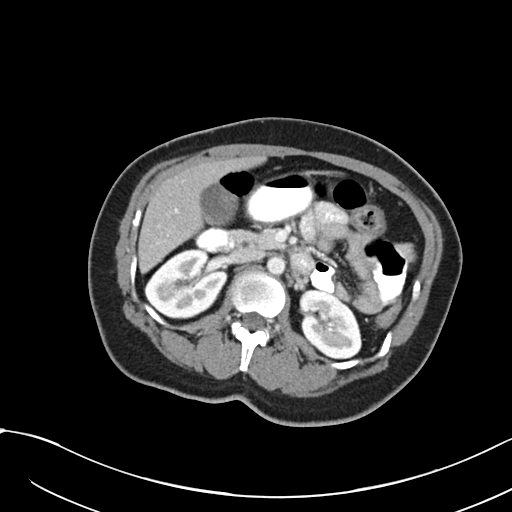
[im 68/93  soft-tissue]
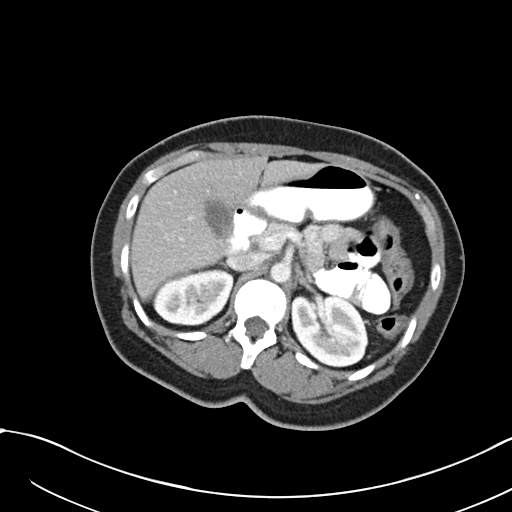
[im 68/93  bone]
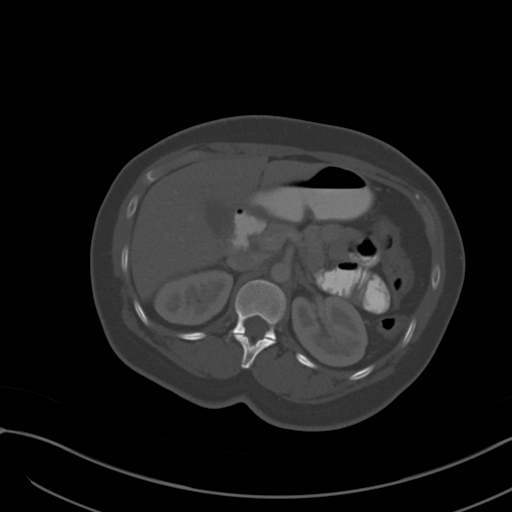
[im 78/93  soft-tissue]
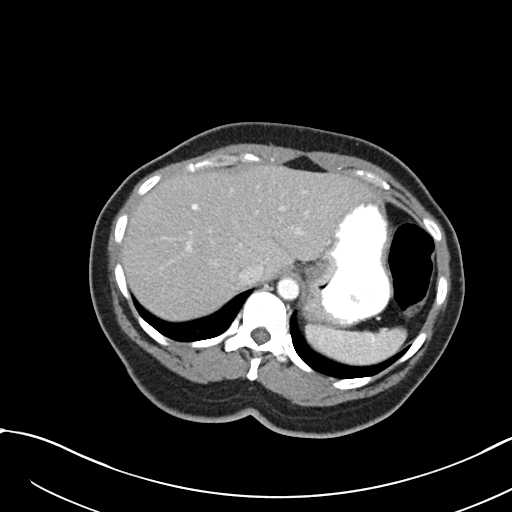
[im 88/93  soft-tissue]
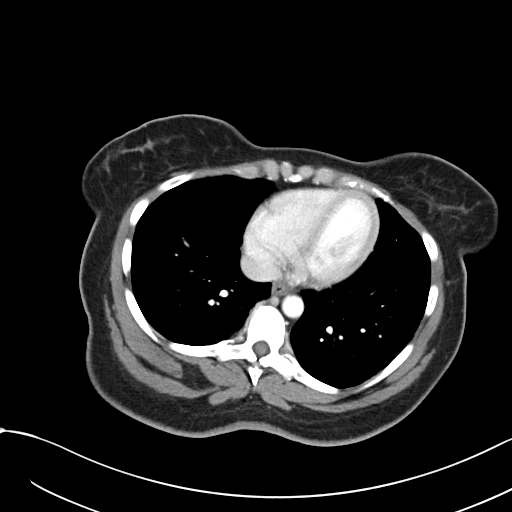

[Series 5: cor · coronal · 0.61mm/px · 3 of 93 slices shown]
[im 31/93  soft-tissue]
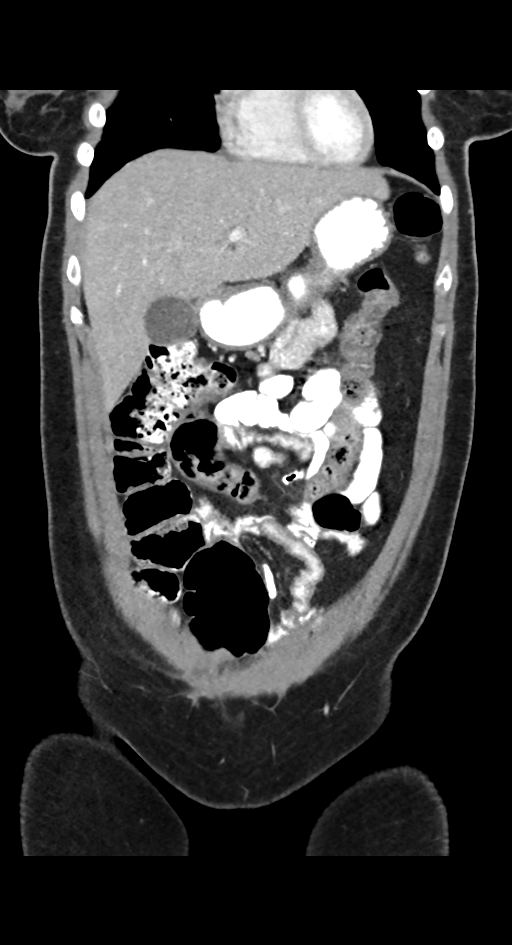
[im 41/93  soft-tissue]
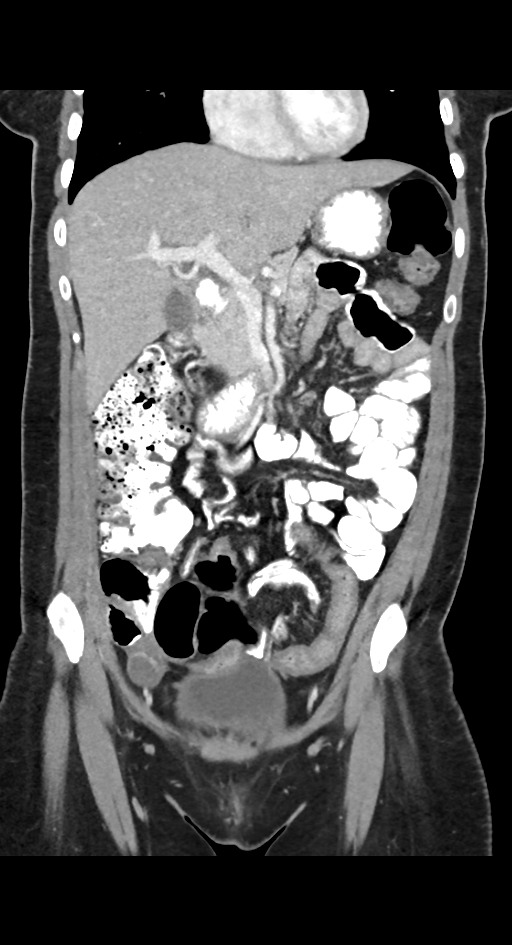
[im 52/93  soft-tissue]
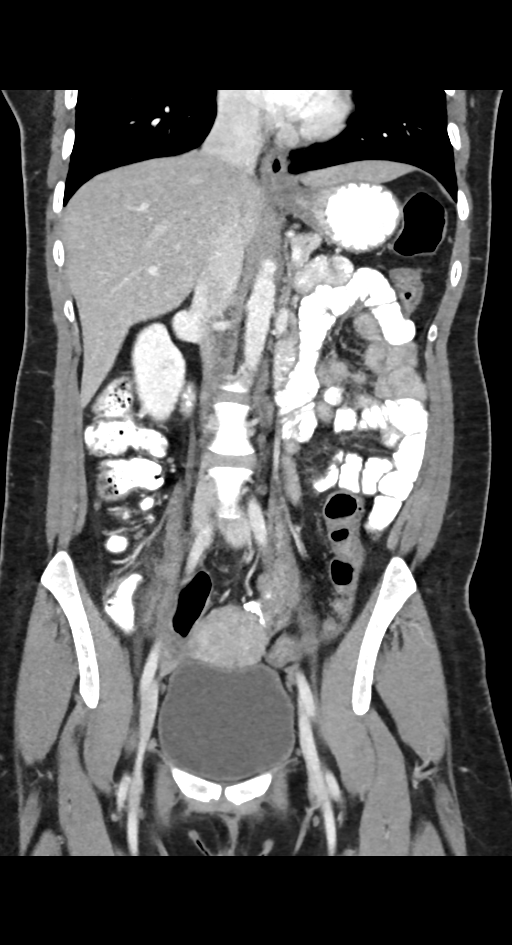

[14 of 46 positions shown; findings below may reference images not displayed]

FINDINGS: Lung Bases: Unremarkable.

Abdomen/Pelvis:  The appearance of the liver, gallbladder,
pancreas, spleen and bilateral adrenal glands is unremarkable.
There are several tiny foci of high attenuation in the collecting
systems of the kidneys bilaterally.  Whether not this represents
very early excretion of contrast material or is in fact evidence of
nonobstructive calculi is uncertain on today's contrast enhanced CT
examination.  At least one of these areas (lower pole collecting
system of the left kidney on image 31 of series 2) appears
suspicious for a 3 mm nonobstructive calculus.

Normal appendix.  A trace volume of free fluid the cul-de-sac,
presumably physiologic in this young female patient.  No larger
volume of ascites.  No pneumoperitoneum.  No pathologic distension
of small bowel.  No definite pathologic lymphadenopathy identified
within the abdomen or pelvis on today's examination.  Uterus and
left ovary is unremarkable in appearance.  Thick-walled cystic
lesion in the right ovary measuring 2.1 cm in diameter as likely
represent a degenerating corpus luteum cyst.

Musculoskeletal: There are no aggressive appearing lytic or blastic
lesions noted in the visualized portions of the skeleton.
IMPRESSION: 1.  Probable nonobstructive calculus in the lower pole collecting
system of the left kidney measuring only 3 mm.
2.  Small volume of free fluid the cul-de-sac is likely physiologic
in this young female patient.  There appears to be a degenerating
corpus luteum cyst in the right ovary.
3.  Normal appendix.
4.  No other potential acute findings to account for the patient's
symptoms.

## 2015-07-13 DIAGNOSIS — M19042 Primary osteoarthritis, left hand: Secondary | ICD-10-CM | POA: Insufficient documentation

## 2015-09-03 ENCOUNTER — Ambulatory Visit
Admission: RE | Admit: 2015-09-03 | Discharge: 2015-09-03 | Disposition: A | Discharge status: Home / Self Care | Payer: BLUE CROSS/BLUE SHIELD | Source: Ambulatory Visit | Attending: Physician Assistant | Admitting: Physician Assistant

## 2015-09-03 ENCOUNTER — Other Ambulatory Visit: Payer: Self-pay | Admitting: Physician Assistant

## 2015-09-03 DIAGNOSIS — R1084 Generalized abdominal pain: Secondary | ICD-10-CM

## 2015-10-29 DIAGNOSIS — G8929 Other chronic pain: Secondary | ICD-10-CM | POA: Diagnosis not present

## 2015-10-29 DIAGNOSIS — M545 Low back pain: Secondary | ICD-10-CM | POA: Diagnosis not present

## 2015-10-29 DIAGNOSIS — M1990 Unspecified osteoarthritis, unspecified site: Secondary | ICD-10-CM | POA: Diagnosis not present

## 2015-11-19 DIAGNOSIS — E109 Type 1 diabetes mellitus without complications: Secondary | ICD-10-CM | POA: Diagnosis not present

## 2015-11-19 DIAGNOSIS — M0609 Rheumatoid arthritis without rheumatoid factor, multiple sites: Secondary | ICD-10-CM | POA: Diagnosis not present

## 2015-11-26 DIAGNOSIS — R300 Dysuria: Secondary | ICD-10-CM | POA: Diagnosis not present

## 2015-11-26 DIAGNOSIS — N390 Urinary tract infection, site not specified: Secondary | ICD-10-CM | POA: Diagnosis not present

## 2015-12-03 DIAGNOSIS — G8929 Other chronic pain: Secondary | ICD-10-CM | POA: Diagnosis not present

## 2015-12-03 DIAGNOSIS — M5440 Lumbago with sciatica, unspecified side: Secondary | ICD-10-CM | POA: Diagnosis not present

## 2015-12-12 DIAGNOSIS — G8929 Other chronic pain: Secondary | ICD-10-CM | POA: Diagnosis not present

## 2015-12-12 DIAGNOSIS — M5440 Lumbago with sciatica, unspecified side: Secondary | ICD-10-CM | POA: Diagnosis not present

## 2015-12-13 DIAGNOSIS — K3184 Gastroparesis: Secondary | ICD-10-CM | POA: Diagnosis not present

## 2015-12-13 DIAGNOSIS — E1043 Type 1 diabetes mellitus with diabetic autonomic (poly)neuropathy: Secondary | ICD-10-CM | POA: Diagnosis not present

## 2015-12-13 DIAGNOSIS — E1065 Type 1 diabetes mellitus with hyperglycemia: Secondary | ICD-10-CM | POA: Diagnosis not present

## 2015-12-26 DIAGNOSIS — M65312 Trigger thumb, left thumb: Secondary | ICD-10-CM | POA: Diagnosis not present

## 2016-01-01 DIAGNOSIS — K3184 Gastroparesis: Secondary | ICD-10-CM | POA: Diagnosis not present

## 2016-01-01 DIAGNOSIS — K5902 Outlet dysfunction constipation: Secondary | ICD-10-CM | POA: Diagnosis not present

## 2016-01-07 DIAGNOSIS — K3184 Gastroparesis: Secondary | ICD-10-CM | POA: Diagnosis not present

## 2016-01-07 DIAGNOSIS — J069 Acute upper respiratory infection, unspecified: Secondary | ICD-10-CM | POA: Diagnosis not present

## 2016-01-15 DIAGNOSIS — J069 Acute upper respiratory infection, unspecified: Secondary | ICD-10-CM | POA: Diagnosis not present

## 2016-01-15 DIAGNOSIS — Z1231 Encounter for screening mammogram for malignant neoplasm of breast: Secondary | ICD-10-CM | POA: Diagnosis not present

## 2016-01-15 DIAGNOSIS — Z1151 Encounter for screening for human papillomavirus (HPV): Secondary | ICD-10-CM | POA: Diagnosis not present

## 2016-01-15 DIAGNOSIS — Z01419 Encounter for gynecological examination (general) (routine) without abnormal findings: Secondary | ICD-10-CM | POA: Diagnosis not present

## 2016-01-15 DIAGNOSIS — R079 Chest pain, unspecified: Secondary | ICD-10-CM | POA: Diagnosis not present

## 2016-01-15 LAB — RESULTS CONSOLE HPV: CHL HPV: NEGATIVE

## 2016-01-15 LAB — HM MAMMOGRAPHY

## 2016-01-15 LAB — HM PAP SMEAR: HM Pap smear: NEGATIVE

## 2016-01-25 ENCOUNTER — Encounter (HOSPITAL_COMMUNITY): Payer: Self-pay | Admitting: Emergency Medicine

## 2016-01-25 ENCOUNTER — Emergency Department (HOSPITAL_COMMUNITY)
Admission: EM | Admit: 2016-01-25 | Discharge: 2016-01-26 | Disposition: A | Discharge status: Home / Self Care | Payer: BLUE CROSS/BLUE SHIELD | Attending: Emergency Medicine | Admitting: Emergency Medicine

## 2016-01-25 ENCOUNTER — Emergency Department (HOSPITAL_COMMUNITY): Payer: BLUE CROSS/BLUE SHIELD

## 2016-01-25 DIAGNOSIS — Z794 Long term (current) use of insulin: Secondary | ICD-10-CM | POA: Diagnosis not present

## 2016-01-25 DIAGNOSIS — M1990 Unspecified osteoarthritis, unspecified site: Secondary | ICD-10-CM | POA: Insufficient documentation

## 2016-01-25 DIAGNOSIS — E785 Hyperlipidemia, unspecified: Secondary | ICD-10-CM | POA: Insufficient documentation

## 2016-01-25 DIAGNOSIS — E119 Type 2 diabetes mellitus without complications: Secondary | ICD-10-CM | POA: Diagnosis not present

## 2016-01-25 DIAGNOSIS — F329 Major depressive disorder, single episode, unspecified: Secondary | ICD-10-CM | POA: Insufficient documentation

## 2016-01-25 DIAGNOSIS — R0602 Shortness of breath: Secondary | ICD-10-CM | POA: Diagnosis present

## 2016-01-25 DIAGNOSIS — Z792 Long term (current) use of antibiotics: Secondary | ICD-10-CM | POA: Diagnosis not present

## 2016-01-25 DIAGNOSIS — Z79899 Other long term (current) drug therapy: Secondary | ICD-10-CM | POA: Insufficient documentation

## 2016-01-25 DIAGNOSIS — R079 Chest pain, unspecified: Secondary | ICD-10-CM | POA: Diagnosis not present

## 2016-01-25 DIAGNOSIS — R0789 Other chest pain: Secondary | ICD-10-CM | POA: Diagnosis not present

## 2016-01-25 DIAGNOSIS — R059 Cough, unspecified: Secondary | ICD-10-CM

## 2016-01-25 DIAGNOSIS — R05 Cough: Secondary | ICD-10-CM

## 2016-01-25 LAB — COMPREHENSIVE METABOLIC PANEL
ALT: 18 U/L (ref 14–54)
AST: 27 U/L (ref 15–41)
Albumin: 4 g/dL (ref 3.5–5.0)
Alkaline Phosphatase: 82 U/L (ref 38–126)
Anion gap: 8 (ref 5–15)
BUN: 17 mg/dL (ref 6–20)
CO2: 26 mmol/L (ref 22–32)
Calcium: 9.2 mg/dL (ref 8.9–10.3)
Chloride: 102 mmol/L (ref 101–111)
Creatinine, Ser: 0.66 mg/dL (ref 0.44–1.00)
GFR calc Af Amer: >60 mL/min (ref >60)
GFR calc non Af Amer: >60 mL/min (ref >60)
Glucose, Bld: 246 mg/dL — ABNORMAL HIGH (ref 65–99)
Potassium: 3.7 mmol/L (ref 3.5–5.1)
Sodium: 136 mmol/L (ref 135–145)
Total Bilirubin: 0.7 mg/dL (ref 0.3–1.2)
Total Protein: 7.6 g/dL (ref 6.5–8.1)

## 2016-01-25 LAB — CBC WITH DIFFERENTIAL/PLATELET
Basophils Absolute: 0 10*3/uL (ref 0.0–0.1)
Basophils Relative: 0 %
Eosinophils Absolute: 0 10*3/uL (ref 0.0–0.7)
Eosinophils Relative: 0 %
HCT: 36.4 % (ref 36.0–46.0)
Hemoglobin: 11.6 g/dL — ABNORMAL LOW (ref 12.0–15.0)
Lymphocytes Relative: 25 %
Lymphs Abs: 2.2 10*3/uL (ref 0.7–4.0)
MCH: 26.6 pg (ref 26.0–34.0)
MCHC: 31.9 g/dL (ref 30.0–36.0)
MCV: 83.5 fL (ref 78.0–100.0)
Monocytes Absolute: 0.5 10*3/uL (ref 0.1–1.0)
Monocytes Relative: 5 %
Neutro Abs: 6.3 10*3/uL (ref 1.7–7.7)
Neutrophils Relative %: 70 %
Platelets: 343 10*3/uL (ref 150–400)
RBC: 4.36 MIL/uL (ref 3.87–5.11)
RDW: 14.3 % (ref 11.5–15.5)
WBC: 9 10*3/uL (ref 4.0–10.5)

## 2016-01-25 LAB — POC URINE PREG, ED: Preg Test, Ur: NEGATIVE

## 2016-01-25 LAB — I-STAT TROPONIN, ED: Troponin i, poc: 0 ng/mL (ref 0.00–0.08)

## 2016-01-25 LAB — D-DIMER, QUANTITATIVE: D-Dimer, Quant: <0.27 ug/mL-FEU (ref 0.00–0.50)

## 2016-01-25 MED ORDER — KETOROLAC TROMETHAMINE 60 MG/2ML IM SOLN
60.0000 mg | Freq: Once | INTRAMUSCULAR | Status: AC
  Administered 2016-01-25: 60 mg via INTRAMUSCULAR
  Filled 2016-01-25: qty 2

## 2016-01-25 NOTE — ED Notes (Signed)
Patient c/o intermittent central chest pain, sharp in nature x2 weeks. Patient states she initially had an URI that triggered her chest pain to start. Patient states the pain is sharp in nature. Patient states she was recently seen at her PCP for the same pain and was told her EKG was ok, but that if her pain came back to come straight to the ER. Patient is A&Ox4, NAD noted. Family at bedside

## 2016-01-25 NOTE — ED Provider Notes (Signed)
CSN: [id number]     Arrival date & time 01/25/16  1925 History   First MD Initiated Contact with Patient 01/25/16 2006     Chief Complaint  Patient presents with  . Chest Pain    x2 weeks     (Consider location/radiation/quality/duration/timing/severity/associated sxs/prior Treatment) Patient is a 45 y.o. female presenting with chest pain.  Chest Pain Associated symptoms: cough (2 weeks, coughing up yellow phlegm) and shortness of breath   Associated symptoms: no abdominal pain, no back pain, no fever, no headache, no nausea and not vomiting      Off and on over last week, saw PCP last week Pain was severe this AM Left sided/retrosternal chest pain no radiation, sharp Laying down made it better Episodes last 10-15 minutes Worse with exertion sometimes, but not with all exertion Standing up was dizzy, bending down made dizzy Was worse with deep breaths but not now  SOB when pain at peak No nausea, no sweating  (has chronic nausea/gastroparesis but no other new nausea)  Past Medical History  Diagnosis Date  . Diabetes mellitus   . Lupus ([place])   . Connective tissue disease ([place])   . Heart murmur     as a child - no problem as adult  . Hyperlipidemia   . Depression   . History of kidney stones     no surgery  . Headache(784.0)   . Arthritis     knees, lower back right side  . Gastroparesis    Past Surgical History  Procedure Laterality Date  . Cesarean section  1994, 2006    x 2  . Colonoscopy with propofol N/A 03/16/2013    Procedure: COLONOSCOPY WITH PROPOFOL;  Surgeon: Arta Silence, MD;  Location: [facility];  Service: Endoscopy;  Laterality: N/A;  . Tubal ligation    . Robotic assisted laparoscopic lysis of adhesion N/A 07/11/2013    Procedure: ROBOTIC ASSISTED LAPAROSCOPIC LYSIS OF ADHESIONS ; REPAIR OF UTERINE DEHISENCE;  Surgeon: Marvene Staff, MD;  Location: [facility];  Service: Gynecology;  Laterality: N/A;  . Hysteroscopy w/d&c N/A 07/11/2013   Procedure: DILATATION AND CURETTAGE /HYSTEROSCOPY;  Surgeon: Marvene Staff, MD;  Location: [facility];  Service: Gynecology;  Laterality: N/A;   History reviewed. No pertinent family history. Social History  Substance Use Topics  . Smoking status: Never Smoker   . Smokeless tobacco: None  . Alcohol Use: No   OB History    No data available     Review of Systems  Constitutional: Negative for fever.  HENT: Negative for sore throat.   Eyes: Negative for visual disturbance.  Respiratory: Positive for cough (2 weeks, coughing up yellow phlegm) and shortness of breath.   Cardiovascular: Positive for chest pain. Negative for leg swelling.  Gastrointestinal: Negative for nausea, vomiting, abdominal pain, diarrhea and constipation.  Genitourinary: Negative for dysuria and difficulty urinating.  Musculoskeletal: Negative for back pain and neck pain.  Skin: Negative for rash.  Neurological: Positive for light-headedness. Negative for syncope and headaches.      Allergies  Morphine and related and Tramadol  Home Medications   Prior to Admission medications   Medication Sig Start Date End Date Taking? Authorizing Provider  Abatacept (ORENCIA) 125 MG/ML SOSY Inject 125 mg into the skin every 14 (fourteen) days. 11/19/15 02/17/16 Yes Historical Provider, MD  amitriptyline (ELAVIL) 10 MG tablet Take 10 mg by mouth at bedtime as needed for sleep.    Yes Historical Provider, MD  atorvastatin (LIPITOR) 40 MG  tablet Take 40 mg by mouth every morning.    Yes Historical Provider, MD  azithromycin (ZITHROMAX) 250 MG tablet Take 250-500 mg by mouth daily.   Yes Historical Provider, MD  bisacodyl (STIMULANT LAXATIVE) 5 MG EC tablet Take 5 mg by mouth 3 (three) times daily as needed. constipation   Yes Historical Provider, MD  clobetasol ointment (TEMOVATE) [id number] % Apply 1 application topically 2 (two) times daily as needed. Skin irritations 03/15/15  Yes Historical Provider, MD  cyclobenzaprine  (FLEXERIL) 10 MG tablet Take 10 mg by mouth 3 (three) times daily as needed. Muscle spasms 12/01/15  Yes Historical Provider, MD  DULoxetine (CYMBALTA) 30 MG capsule Take 30 mg by mouth daily. 10/23/15  Yes Historical Provider, MD  erythromycin (E-MYCIN) 250 MG tablet Take 250 mg by mouth 4 (four) times daily - after meals and at bedtime. 01/02/16  Yes Historical Provider, MD  glucagon (GLUCAGON EMERGENCY) 1 MG injection Inject 1 mg into the skin once as needed. For low blood sugar 12/13/15  Yes Historical Provider, MD  hydrocortisone (ANUSOL-HC) 25 MG suppository Place 25 mg rectally 2 (two) times daily as needed. hemorrhoids 10/02/15  Yes Historical Provider, MD  hydrocortisone (PROCTOSOL HC) 2.5 % rectal cream Place 1 application rectally daily as needed. hemorrhoids 12/11/14  Yes Historical Provider, MD  hydroxychloroquine (PLAQUENIL) 200 MG tablet Take 200 mg by mouth every morning.    Yes Historical Provider, MD  Insulin Glargine (TOUJEO SOLOSTAR) 300 UNIT/ML SOPN Inject 30 Units into the skin every morning.   Yes Historical Provider, MD  insulin glulisine (APIDRA) 100 UNIT/ML injection Inject 8 Units into the skin 3 (three) times daily before meals.    Yes Historical Provider, MD  linaclotide (LINZESS) 290 MCG CAPS capsule Take 290 mg by mouth daily.   Yes Historical Provider, MD  metoCLOPramide (REGLAN) 10 MG tablet Take 10 mg by mouth daily as needed. n/v 05/17/15  Yes Historical Provider, MD  Multiple Vitamin (MULTI-VITAMINS) TABS Take 1 tablet by mouth daily.   Yes Historical Provider, MD  sennosides-docusate sodium (SENOKOT-S) 8.6-50 MG tablet Take 1 tablet by mouth 2 (two) times daily as needed for constipation.   Yes Historical Provider, MD  Sod Picosulfate-Mag Ox-Cit Acd ([place]) 10-3.5-12 MG-GM-GM PACK Take 1 packet by mouth daily as needed. constipation 05/14/15  Yes Historical Provider, MD  zolpidem (AMBIEN CR) 12.5 MG CR tablet Take 1 tablet (12.5 mg total) by mouth at bedtime as needed for  sleep. 07/11/13  Yes Servando Salina, MD  naproxen (NAPROSYN) 500 MG tablet Take 1 tablet (500 mg total) by mouth 2 (two) times daily with a meal. 01/26/16   Gareth Morgan, MD   BP 136/91 mmHg  Pulse 91  Temp(Src) 98.3 F (36.8 C) (Temporal)  Resp 19  Ht 5\' 7"  (1.702 m)  Wt 143 lb (64.864 kg)  BMI 22.39 kg/m2  SpO2 100%  LMP 12/25/2015 Physical Exam  Constitutional: She is oriented to person, place, and time. She appears well-developed and well-nourished. No distress.  HENT:  Head: Normocephalic and atraumatic.  Eyes: Conjunctivae and EOM are normal.  Neck: Normal range of motion.  Cardiovascular: Normal rate, regular rhythm, normal heart sounds and intact distal pulses.  Exam reveals no gallop and no friction rub.   No murmur heard. Pulmonary/Chest: Effort normal and breath sounds normal. No respiratory distress. She has no wheezes. She has no rales. She exhibits tenderness (pinpoint area sternocostal margin).  Abdominal: Soft. She exhibits no distension. There is no tenderness. There is  no guarding.  Musculoskeletal: She exhibits no edema or tenderness.  Neurological: She is alert and oriented to person, place, and time.  Skin: Skin is warm and dry. No rash noted. She is not diaphoretic. No erythema.  Nursing note and vitals reviewed.   ED Course  Procedures (including critical care time) Labs Review Labs Reviewed  CBC WITH DIFFERENTIAL/PLATELET - Abnormal; Notable for the following:    Hemoglobin 11.6 (*)    All other components within normal limits  COMPREHENSIVE METABOLIC PANEL - Abnormal; Notable for the following:    Glucose, Bld 246 (*)    All other components within normal limits  D-DIMER, QUANTITATIVE (NOT AT [facility])  I-STAT TROPOININ, ED  Randolm Idol, ED  POC URINE PREG, ED    Imaging Review Dg Chest 2 View  01/25/2016  CLINICAL DATA:  Chest pain for 2 weeks. EXAM: CHEST  2 VIEW COMPARISON:  August 27, 2013 FINDINGS: The heart size and mediastinal  contours are within normal limits. Both lungs are clear. The visualized skeletal structures are unremarkable. IMPRESSION: No active cardiopulmonary disease. Electronically Signed   By: Dorise Bullion III M.D   On: 01/25/2016 20:37   I have personally reviewed and evaluated these images and lab results as part of my medical decision-making.   EKG Interpretation   Date/Time:  Friday January 25 2016 19:42:34 EDT Ventricular Rate:  102 PR Interval:    QRS Duration: 72 QT Interval:  327 QTC Calculation: 426 R Axis:   59 Text Interpretation:  Sinus tachycardia Probable anteroseptal infarct, old  Minimal ST elevation, inferior leads Baseline wander in lead(s) II III aVF  V6 No significant change since last tracing Confirmed by [facility] MD,  Jakarri Lesko ([id number]) on 01/25/2016 8:06:48 PM      MDM   Final diagnoses:  Chest pain, unspecified chest pain type  Cough  Chest wall pain    45 year old female with history of lupus, DM, hyperlipidemia presents with concern of chest pain. Differential diagnosis for chest pain includes pulmonary embolus, dissection, pneumothorax, pneumonia, ACS, myocarditis, pericarditis.  EKG was done and evaluate by me and showed no acute ST changes and no signs of pericarditis. Chest x-ray was done and evaluated by me and radiology and showed no sign of pneumonia or pneumothorax. Patient with negative ddimer low risk [place] and have low suspicion for PE.  Patient is low risk HEART score and had delta troponins which were both negative. Given this evaluation, history and physical have low suspicion for pulmonary embolus, pneumonia, ACS, myocarditis, pericarditis, dissection.   Patient with possible musculoskeletal chest pain given pain with palpation, hx of recent cough.  Given Toradol for pain in the ED and rx for naproxen to take instead of ibuprofen and discussed need for close PCP follow up. PCP had written pt azithromycin however given negative XR, discussed cough likely  continuing viral syndrome.  Patient discharged in stable condition with understanding of reasons to return and recommend PCP follow up.   Gareth Morgan, MD 01/26/16 1231

## 2016-01-26 DIAGNOSIS — R0789 Other chest pain: Secondary | ICD-10-CM | POA: Diagnosis not present

## 2016-01-26 LAB — I-STAT TROPONIN, ED: Troponin i, poc: 0 ng/mL (ref 0.00–0.08)

## 2016-01-26 MED ORDER — NAPROXEN 500 MG PO TABS: 500.0000 mg | ORAL_TABLET | Freq: Two times a day (BID) | ORAL | Status: DC

## 2016-01-28 DIAGNOSIS — R0789 Other chest pain: Secondary | ICD-10-CM | POA: Diagnosis not present

## 2016-01-28 DIAGNOSIS — R768 Other specified abnormal immunological findings in serum: Secondary | ICD-10-CM | POA: Diagnosis not present

## 2016-01-28 DIAGNOSIS — M1990 Unspecified osteoarthritis, unspecified site: Secondary | ICD-10-CM | POA: Diagnosis not present

## 2016-01-28 DIAGNOSIS — M069 Rheumatoid arthritis, unspecified: Secondary | ICD-10-CM | POA: Diagnosis not present

## 2016-01-28 DIAGNOSIS — M940 Chondrocostal junction syndrome [Tietze]: Secondary | ICD-10-CM | POA: Diagnosis not present

## 2016-01-28 DIAGNOSIS — R011 Cardiac murmur, unspecified: Secondary | ICD-10-CM | POA: Diagnosis not present

## 2016-01-28 DIAGNOSIS — G8929 Other chronic pain: Secondary | ICD-10-CM | POA: Diagnosis not present

## 2016-01-28 DIAGNOSIS — Z9229 Personal history of other drug therapy: Secondary | ICD-10-CM | POA: Diagnosis not present

## 2016-01-28 DIAGNOSIS — R52 Pain, unspecified: Secondary | ICD-10-CM | POA: Diagnosis not present

## 2016-02-01 DIAGNOSIS — M1990 Unspecified osteoarthritis, unspecified site: Secondary | ICD-10-CM | POA: Diagnosis not present

## 2016-02-01 DIAGNOSIS — R0789 Other chest pain: Secondary | ICD-10-CM | POA: Diagnosis not present

## 2016-02-01 DIAGNOSIS — R011 Cardiac murmur, unspecified: Secondary | ICD-10-CM | POA: Diagnosis not present

## 2016-02-25 DIAGNOSIS — K5909 Other constipation: Secondary | ICD-10-CM | POA: Diagnosis not present

## 2016-02-25 DIAGNOSIS — E119 Type 2 diabetes mellitus without complications: Secondary | ICD-10-CM | POA: Diagnosis not present

## 2016-02-25 DIAGNOSIS — K3184 Gastroparesis: Secondary | ICD-10-CM | POA: Diagnosis not present

## 2016-03-24 DIAGNOSIS — K3184 Gastroparesis: Secondary | ICD-10-CM | POA: Diagnosis not present

## 2016-03-24 DIAGNOSIS — M0609 Rheumatoid arthritis without rheumatoid factor, multiple sites: Secondary | ICD-10-CM | POA: Diagnosis not present

## 2016-03-24 DIAGNOSIS — E1043 Type 1 diabetes mellitus with diabetic autonomic (poly)neuropathy: Secondary | ICD-10-CM | POA: Diagnosis not present

## 2016-03-24 DIAGNOSIS — E1065 Type 1 diabetes mellitus with hyperglycemia: Secondary | ICD-10-CM | POA: Diagnosis not present

## 2016-03-24 DIAGNOSIS — E109 Type 1 diabetes mellitus without complications: Secondary | ICD-10-CM | POA: Diagnosis not present

## 2016-04-07 DIAGNOSIS — K5902 Outlet dysfunction constipation: Secondary | ICD-10-CM | POA: Diagnosis not present

## 2016-04-25 DIAGNOSIS — D219 Benign neoplasm of connective and other soft tissue, unspecified: Secondary | ICD-10-CM | POA: Diagnosis not present

## 2016-04-29 DIAGNOSIS — M19042 Primary osteoarthritis, left hand: Secondary | ICD-10-CM | POA: Diagnosis not present

## 2016-04-29 DIAGNOSIS — M65312 Trigger thumb, left thumb: Secondary | ICD-10-CM | POA: Diagnosis not present

## 2016-05-05 DIAGNOSIS — L280 Lichen simplex chronicus: Secondary | ICD-10-CM | POA: Diagnosis not present

## 2016-05-06 ENCOUNTER — Emergency Department (HOSPITAL_COMMUNITY): Payer: BLUE CROSS/BLUE SHIELD

## 2016-05-06 ENCOUNTER — Encounter (HOSPITAL_COMMUNITY): Payer: Self-pay | Admitting: Emergency Medicine

## 2016-05-06 ENCOUNTER — Emergency Department (HOSPITAL_COMMUNITY)
Admission: EM | Admit: 2016-05-06 | Discharge: 2016-05-07 | Disposition: A | Discharge status: Home / Self Care | Payer: BLUE CROSS/BLUE SHIELD | Attending: Emergency Medicine | Admitting: Emergency Medicine

## 2016-05-06 DIAGNOSIS — E1143 Type 2 diabetes mellitus with diabetic autonomic (poly)neuropathy: Secondary | ICD-10-CM | POA: Insufficient documentation

## 2016-05-06 DIAGNOSIS — N3000 Acute cystitis without hematuria: Secondary | ICD-10-CM | POA: Diagnosis not present

## 2016-05-06 DIAGNOSIS — Z794 Long term (current) use of insulin: Secondary | ICD-10-CM | POA: Insufficient documentation

## 2016-05-06 DIAGNOSIS — R1084 Generalized abdominal pain: Secondary | ICD-10-CM | POA: Diagnosis not present

## 2016-05-06 DIAGNOSIS — R1030 Lower abdominal pain, unspecified: Secondary | ICD-10-CM | POA: Diagnosis not present

## 2016-05-06 DIAGNOSIS — Z79899 Other long term (current) drug therapy: Secondary | ICD-10-CM | POA: Diagnosis not present

## 2016-05-06 LAB — CBC
HCT: 37.6 % (ref 36.0–46.0)
Hemoglobin: 12.4 g/dL (ref 12.0–15.0)
MCH: 27.3 pg (ref 26.0–34.0)
MCHC: 33 g/dL (ref 30.0–36.0)
MCV: 82.6 fL (ref 78.0–100.0)
Platelets: 323 10*3/uL (ref 150–400)
RBC: 4.55 MIL/uL (ref 3.87–5.11)
RDW: 13.7 % (ref 11.5–15.5)
WBC: 9.4 10*3/uL (ref 4.0–10.5)

## 2016-05-06 LAB — URINALYSIS, ROUTINE W REFLEX MICROSCOPIC
Bilirubin Urine: NEGATIVE
Glucose, UA: 250 mg/dL — AB
Hgb urine dipstick: NEGATIVE
Ketones, ur: NEGATIVE mg/dL
Nitrite: NEGATIVE
Protein, ur: NEGATIVE mg/dL
Specific Gravity, Urine: 1.012 (ref 1.005–1.030)
pH: 5.5 (ref 5.0–8.0)

## 2016-05-06 LAB — URINE MICROSCOPIC-ADD ON: RBC / HPF: NONE SEEN RBC/hpf (ref 0–5)

## 2016-05-06 LAB — COMPREHENSIVE METABOLIC PANEL
ALT: 12 U/L — ABNORMAL LOW (ref 14–54)
AST: 25 U/L (ref 15–41)
Albumin: 3.7 g/dL (ref 3.5–5.0)
Alkaline Phosphatase: 73 U/L (ref 38–126)
Anion gap: 10 (ref 5–15)
BUN: 15 mg/dL (ref 6–20)
CO2: 24 mmol/L (ref 22–32)
Calcium: 9.4 mg/dL (ref 8.9–10.3)
Chloride: 102 mmol/L (ref 101–111)
Creatinine, Ser: 1.35 mg/dL — ABNORMAL HIGH (ref 0.44–1.00)
GFR calc Af Amer: 54 mL/min — ABNORMAL LOW (ref >60)
GFR calc non Af Amer: 47 mL/min — ABNORMAL LOW (ref >60)
Glucose, Bld: 289 mg/dL — ABNORMAL HIGH (ref 65–99)
Potassium: 4.2 mmol/L (ref 3.5–5.1)
Sodium: 136 mmol/L (ref 135–145)
Total Bilirubin: 0.6 mg/dL (ref 0.3–1.2)
Total Protein: 7.5 g/dL (ref 6.5–8.1)

## 2016-05-06 LAB — LIPASE, BLOOD: Lipase: 16 U/L (ref 11–51)

## 2016-05-06 LAB — CBG MONITORING, ED: Glucose-Capillary: 265 mg/dL — ABNORMAL HIGH (ref 65–99)

## 2016-05-06 LAB — I-STAT BETA HCG BLOOD, ED (MC, WL, AP ONLY): I-stat hCG, quantitative: <5 m[IU]/mL (ref <5)

## 2016-05-06 MED ORDER — PROMETHAZINE HCL 25 MG PO TABS: 25.0000 mg | ORAL_TABLET | Freq: Four times a day (QID) | ORAL | 1 refills | Status: DC | PRN

## 2016-05-06 MED ORDER — SODIUM CHLORIDE 0.9 % IV BOLUS (SEPSIS)
1000.0000 mL | Freq: Once | INTRAVENOUS | Status: AC
  Administered 2016-05-06: 1000 mL via INTRAVENOUS

## 2016-05-06 MED ORDER — HYDROCODONE-ACETAMINOPHEN 5-325 MG PO TABS: 1.0000 | ORAL_TABLET | Freq: Four times a day (QID) | ORAL | 0 refills | Status: DC | PRN

## 2016-05-06 MED ORDER — ONDANSETRON HCL 4 MG/2ML IJ SOLN
INTRAMUSCULAR | Status: AC
  Administered 2016-05-06: 4 mg via INTRAVENOUS
  Filled 2016-05-06: qty 2

## 2016-05-06 MED ORDER — DIPHENHYDRAMINE HCL 50 MG/ML IJ SOLN
25.0000 mg | Freq: Once | INTRAMUSCULAR | Status: AC
  Administered 2016-05-06: 25 mg via INTRAVENOUS
  Filled 2016-05-06: qty 1

## 2016-05-06 MED ORDER — ONDANSETRON HCL 4 MG/2ML IJ SOLN
4.0000 mg | Freq: Once | INTRAMUSCULAR | Status: AC
  Administered 2016-05-06: 4 mg via INTRAVENOUS
  Filled 2016-05-06: qty 2

## 2016-05-06 MED ORDER — DEXTROSE 5 % IV SOLN
1.0000 g | Freq: Once | INTRAVENOUS | Status: AC
  Administered 2016-05-06: 1 g via INTRAVENOUS
  Filled 2016-05-06: qty 10

## 2016-05-06 MED ORDER — HYDROMORPHONE HCL 1 MG/ML IJ SOLN
1.0000 mg | Freq: Once | INTRAMUSCULAR | Status: AC
  Administered 2016-05-06: 1 mg via INTRAVENOUS
  Filled 2016-05-06: qty 1

## 2016-05-06 MED ORDER — IOPAMIDOL (ISOVUE-300) INJECTION 61%
INTRAVENOUS | Status: AC
  Administered 2016-05-06: 80 mL
  Filled 2016-05-06: qty 100

## 2016-05-06 MED ORDER — SODIUM CHLORIDE 0.9 % IV SOLN
INTRAVENOUS | Status: DC
  Administered 2016-05-06: 21:00:00 via INTRAVENOUS

## 2016-05-06 MED ORDER — ONDANSETRON 4 MG PO TBDP: 4.0000 mg | ORAL_TABLET | Freq: Three times a day (TID) | ORAL | 1 refills | Status: DC | PRN

## 2016-05-06 MED ORDER — CEPHALEXIN 500 MG PO CAPS: 500.0000 mg | ORAL_CAPSULE | Freq: Four times a day (QID) | ORAL | 0 refills | Status: DC

## 2016-05-06 NOTE — Discharge Instructions (Signed)
Urinalysis with questionable early urinary tract infection. Not enough to explain the abdominal pain. Urine culture sent take the antibiotic Keflex as directed. Take the hydrocodone as needed for pain take the Zofran and Phenergan as needed for nausea and vomiting. Return for any new or worse symptoms. CT scan without any acute findings labs without any significant abnormalities. Follow-up with your regular doctor.

## 2016-05-06 NOTE — ED Notes (Signed)
Pt returned from CT at this time via ED stretcher. Pt in no apparent distress at this time.   

## 2016-05-06 NOTE — ED Triage Notes (Signed)
Pt sts N/V and abd pain with some generalized body aches; pt sts thinks "her sugar is high" but has not checked it

## 2016-05-06 NOTE — ED Notes (Signed)
Pt's CBG result was 265. Informed Jessica - RN.

## 2016-05-06 NOTE — ED Provider Notes (Signed)
[facility] DEPT Provider Note   CSN: [id number] Arrival date & time: 05/06/16  1334     History   Chief Complaint Chief Complaint  Patient presents with  . Abdominal Pain    HPI Tiffany [name] is a 45 y.o. female.   Patient with onset of generalized abdominal pain today. Pain now predominantly left lower quadrant radiating to the back associated with nausea and vomiting several episodes. No diarrhea no blood in the vomit. Patient also concerned blood sugars elevated. Patient with a history of a connective tissue disorder and questionable lupus. Patient states that she's had some abdominal pain on and off for a period of time but just got worse here today. Patient's also concerned that her blood sugars are high. Denies fevers. Abdominal pain is 10 out of 10.      Past Medical History:  Diagnosis Date  . Arthritis    knees, lower back right side  . Connective tissue disease ([place])   . Depression   . Diabetes mellitus   . Gastroparesis   . Headache(784.0)   . Heart murmur    as a child - no problem as adult  . History of kidney stones    no surgery  . Hyperlipidemia   . Lupus     There are no active problems to display for this patient.   Past Surgical History:  Procedure Laterality Date  . [place], 2006   x 2  . COLONOSCOPY WITH PROPOFOL N/A 03/16/2013   Procedure: COLONOSCOPY WITH PROPOFOL;  Surgeon: Arta Silence, MD;  Location: [facility];  Service: Endoscopy;  Laterality: N/A;  . HYSTEROSCOPY W/D&C N/A 07/11/2013   Procedure: DILATATION AND CURETTAGE /HYSTEROSCOPY;  Surgeon: Marvene Staff, MD;  Location: [facility];  Service: Gynecology;  Laterality: N/A;  . ROBOTIC ASSISTED LAPAROSCOPIC LYSIS OF ADHESION N/A 07/11/2013   Procedure: ROBOTIC ASSISTED LAPAROSCOPIC LYSIS OF ADHESIONS ; REPAIR OF UTERINE DEHISENCE;  Surgeon: Marvene Staff, MD;  Location: [facility];  Service: Gynecology;  Laterality: N/A;  . TUBAL LIGATION      OB  History    No data available       Home Medications    Prior to Admission medications   Medication Sig Start Date End Date Taking? Authorizing Provider  atorvastatin (LIPITOR) 40 MG tablet Take 40 mg by mouth every morning.    Yes Historical Provider, MD  Abatacept (ORENCIA) 125 MG/ML SOSY Inject 125 mg into the skin every 14 (fourteen) days. 11/19/15 02/17/16  Historical Provider, MD  bisacodyl (STIMULANT LAXATIVE) 5 MG EC tablet Take 5 mg by mouth 3 (three) times daily as needed (for constipation).     Historical Provider, MD  cephALEXin (KEFLEX) 500 MG capsule Take 1 capsule (500 mg total) by mouth 4 (four) times daily. 05/06/16   Fredia Sorrow, MD  clobetasol ointment (TEMOVATE) [id number] % Apply 1 application topically 2 (two) times daily as needed. Skin irritations 03/15/15   Historical Provider, MD  cyclobenzaprine (FLEXERIL) 10 MG tablet Take 10 mg by mouth 3 (three) times daily as needed. Muscle spasms 12/01/15   Historical Provider, MD  DULoxetine (CYMBALTA) 30 MG capsule Take 30 mg by mouth daily. 10/23/15   Historical Provider, MD  erythromycin (E-MYCIN) 250 MG tablet Take 250 mg by mouth 4 (four) times daily - after meals and at bedtime. 01/02/16   Historical Provider, MD  glucagon (GLUCAGON EMERGENCY) 1 MG injection Inject 1 mg into the skin once as needed. For low blood sugar  12/13/15   Historical Provider, MD  HYDROcodone-acetaminophen (NORCO/VICODIN) 5-325 MG tablet Take 1-2 tablets by mouth every 6 (six) hours as needed for moderate pain. 05/06/16   Fredia Sorrow, MD  hydrocortisone (ANUSOL-HC) 25 MG suppository Place 25 mg rectally 2 (two) times daily as needed. hemorrhoids 10/02/15   Historical Provider, MD  hydrocortisone (PROCTOSOL HC) 2.5 % rectal cream Place 1 application rectally daily as needed. hemorrhoids 12/11/14   Historical Provider, MD  hydroxychloroquine (PLAQUENIL) 200 MG tablet Take 200 mg by mouth every morning.     Historical Provider, MD  Insulin Glargine (TOUJEO  SOLOSTAR) 300 UNIT/ML SOPN Inject 30 Units into the skin every morning.    Historical Provider, MD  insulin glulisine (APIDRA) 100 UNIT/ML injection Inject 8 Units into the skin 3 (three) times daily before meals.     Historical Provider, MD  linaclotide (LINZESS) 290 MCG CAPS capsule Take 290 mg by mouth daily.    Historical Provider, MD  metoCLOPramide (REGLAN) 10 MG tablet Take 10 mg by mouth daily as needed. n/v 05/17/15   Historical Provider, MD  Multiple Vitamin (MULTI-VITAMINS) TABS Take 1 tablet by mouth daily.    Historical Provider, MD  naproxen (NAPROSYN) 500 MG tablet Take 1 tablet (500 mg total) by mouth 2 (two) times daily with a meal. 01/26/16   Gareth Morgan, MD  ondansetron (ZOFRAN ODT) 4 MG disintegrating tablet Take 1 tablet (4 mg total) by mouth every 8 (eight) hours as needed for nausea or vomiting. 05/06/16   Fredia Sorrow, MD  promethazine (PHENERGAN) 25 MG tablet Take 1 tablet (25 mg total) by mouth every 6 (six) hours as needed for nausea or vomiting. 05/06/16   Fredia Sorrow, MD  sennosides-docusate sodium (SENOKOT-S) 8.6-50 MG tablet Take 1 tablet by mouth 2 (two) times daily as needed for constipation.    Historical Provider, MD  Sod Picosulfate-Mag Ox-Cit Acd ([place]) 10-3.5-12 MG-GM-GM PACK Take 1 packet by mouth daily as needed. constipation 05/14/15   Historical Provider, MD  zolpidem (AMBIEN CR) 12.5 MG CR tablet Take 1 tablet (12.5 mg total) by mouth at bedtime as needed for sleep. 07/11/13   Servando Salina, MD    Family History History reviewed. No pertinent family history.  Social History Social History  Substance Use Topics  . Smoking status: Never Smoker  . Smokeless tobacco: Not on file  . Alcohol use No     Allergies   Morphine and related and Tramadol   Review of Systems Review of Systems  Constitutional: Negative for fever.  HENT: Negative for congestion.   Eyes: Negative for visual disturbance.  Respiratory: Negative for shortness  of breath.   Cardiovascular: Negative for chest pain.  Gastrointestinal: Positive for abdominal pain, nausea and vomiting. Negative for diarrhea.  Genitourinary: Negative for dysuria.  Musculoskeletal: Positive for back pain and myalgias.  Skin: Negative for rash.  Neurological: Negative for headaches.  Hematological: Does not bruise/bleed easily.  Psychiatric/Behavioral: Negative for confusion.     Physical Exam Updated Vital Signs BP (!) 121/107   Pulse 104   Temp 98.3 F (36.8 C) (Oral)   Resp 14   SpO2 97%   Physical Exam  Constitutional: She is oriented to person, place, and time. She appears well-developed and well-nourished. No distress.  HENT:  Head: Normocephalic and atraumatic.  Mouth/Throat: Oropharynx is clear and moist.  Eyes: Conjunctivae and EOM are normal. Pupils are equal, round, and reactive to light.  Neck: Normal range of motion. Neck supple.  Cardiovascular: Normal rate,  regular rhythm and normal heart sounds.   Pulmonary/Chest: Effort normal and breath sounds normal. No respiratory distress.  Abdominal: Soft. Bowel sounds are normal. There is tenderness.  Musculoskeletal: Normal range of motion.  Neurological: She is alert and oriented to person, place, and time. No cranial nerve deficit. She exhibits normal muscle tone. Coordination normal.  Skin: Skin is warm.  Nursing note and vitals reviewed.    ED Treatments / Results  Labs (all labs ordered are listed, but only abnormal results are displayed) Labs Reviewed  COMPREHENSIVE METABOLIC PANEL - Abnormal; Notable for the following:       Result Value   Glucose, Bld 289 (*)    Creatinine, Ser 1.35 (*)    ALT 12 (*)    GFR calc non Af Amer 47 (*)    GFR calc Af Amer 54 (*)    All other components within normal limits  URINALYSIS, ROUTINE W REFLEX MICROSCOPIC (NOT AT [facility]) - Abnormal; Notable for the following:    APPearance HAZY (*)    Glucose, UA 250 (*)    Leukocytes, UA SMALL (*)    All  other components within normal limits  URINE MICROSCOPIC-ADD ON - Abnormal; Notable for the following:    Squamous Epithelial / LPF 6-30 (*)    Bacteria, UA FEW (*)    All other components within normal limits  CBG MONITORING, ED - Abnormal; Notable for the following:    Glucose-Capillary 265 (*)    All other components within normal limits  URINE CULTURE  LIPASE, BLOOD  CBC  I-STAT BETA HCG BLOOD, ED ([facility], [facility], [facility] ONLY)    EKG  EKG Interpretation None       Radiology Ct Abdomen Pelvis W Contrast  Result Date: 05/06/2016 CLINICAL DATA:  Bilateral lower abdominal and back pain with nausea and vomiting EXAM: CT ABDOMEN AND PELVIS WITH CONTRAST TECHNIQUE: Multidetector CT imaging of the abdomen and pelvis was performed using the standard protocol following bolus administration of intravenous contrast. CONTRAST:  75mL ISOVUE-300 IOPAMIDOL (ISOVUE-300) INJECTION 61% COMPARISON:  09/03/2015, CT 02/15/2014 FINDINGS: Lower chest: Visualized lung bases demonstrate no acute infiltrate or pleural effusion. Heart size nonenlarged. No pericardial effusion identified. Hepatobiliary: No focal liver abnormality is seen. No gallstones, gallbladder wall thickening, or biliary dilatation. Pancreas: Unremarkable. No pancreatic ductal dilatation or surrounding inflammatory changes. Spleen: Normal in size without focal abnormality. Probable accessory splenules along the anterior inferior aspect of the spleen. Adrenals/Urinary Tract: Adrenal glands appear normal. Mildly heterogeneous enhancement of the kidneys. No hydronephrosis. Normal excretion of contrast. Mild right greater than left perinephric edema and fluid. Urinary bladder is unremarkable. Stomach/Bowel: Stomach is within normal limits. Appendix appears normal. No evidence of bowel wall thickening, distention, or inflammatory changes. Vascular/Lymphatic: Scattered vascular calcifications in the aorta. Aorta is non aneurysmal. Nonspecific 9 mm left  periaortic lymph node. Reproductive: 2.1 cm enhancing mass within the uterus. Uterus is somewhat globular in configuration. No obvious adnexal masses. Other: No free fluid.  No free air. Musculoskeletal: Small nonspecific sclerotic foci within the proximal femur is, probable bone islands. Stable benign-appearing lucent lesion in the left sacrum adjacent to the SI joint with thin rim of sclerosis. IMPRESSION: 1. Mild heterogeneous enhancement of the kidneys with bilateral right greater than left perinephric edema and fluid. Findings could be secondary to pyelonephritis. No evidence for abscess. No hydronephrosis. 2. 2.1 cm enhancing uterine mass, possible fibroid. Electronically Signed   By: Donavan Foil M.D.   On: 05/06/2016 21:44  Procedures Procedures (including critical care time)  Medications Ordered in ED Medications  0.9 %  sodium chloride infusion ( Intravenous New Bag/Given 05/06/16 2052)  cefTRIAXone (ROCEPHIN) 1 g in dextrose 5 % 50 mL IVPB (1 g Intravenous New Bag/Given 05/06/16 2303)  sodium chloride 0.9 % bolus 1,000 mL (1,000 mLs Intravenous New Bag/Given 05/06/16 2052)  ondansetron (ZOFRAN) injection 4 mg (4 mg Intravenous Given 05/06/16 2052)  HYDROmorphone (DILAUDID) injection 1 mg (1 mg Intravenous Given 05/06/16 2051)  iopamidol (ISOVUE-300) 61 % injection (80 mLs  Contrast Given 05/06/16 2101)  ondansetron (ZOFRAN) 4 MG/2ML injection (4 mg Intravenous Given 05/06/16 2206)  diphenhydrAMINE (BENADRYL) injection 25 mg (25 mg Intravenous Given 05/06/16 2222)     Initial Impression / Assessment and Plan / ED Course  I have reviewed the triage vital signs and the nursing notes.  Pertinent labs & imaging results that were available during my care of the patient were reviewed by me and considered in my medical decision making (see chart for details).  Clinical Course    Patient presenting with a complaint of abdominal pain and bleeding their blood sugars have been elevated.  Abdominal pain started today was generalized but now more left lower quadrant and towards the back associated with nausea and vomiting no diarrhea no blood in the vomit.  Blood sugar here was 289 no evidence of any metabolic acidosis. CT scan of the abdomen without any acute findings. Urinalysis questionable early urinary tract infection culture sent. CT scan showed some questions of pyelonephritis however patient's urinalysis not consistent with that. But will be started on antibiotics. One dose of Rocephin here will be continued on Keflex. Other treatment will be symptomatic with antinausea medicine and pain medicine and follow-up with her doctor.  Final Clinical Impressions(s) / ED Diagnoses   Final diagnoses:  Generalized abdominal pain  Acute cystitis without hematuria    New Prescriptions New Prescriptions   CEPHALEXIN (KEFLEX) 500 MG CAPSULE    Take 1 capsule (500 mg total) by mouth 4 (four) times daily.   HYDROCODONE-ACETAMINOPHEN (NORCO/VICODIN) 5-325 MG TABLET    Take 1-2 tablets by mouth every 6 (six) hours as needed for moderate pain.   ONDANSETRON (ZOFRAN ODT) 4 MG DISINTEGRATING TABLET    Take 1 tablet (4 mg total) by mouth every 8 (eight) hours as needed for nausea or vomiting.   PROMETHAZINE (PHENERGAN) 25 MG TABLET    Take 1 tablet (25 mg total) by mouth every 6 (six) hours as needed for nausea or vomiting.     Fredia Sorrow, MD 05/06/16 [phone]

## 2016-05-07 NOTE — ED Notes (Signed)
Patient Alert and oriented X4. Stable and ambulatory. Patient verbalized understanding of the discharge instructions.  Patient belongings were taken by the patient.  

## 2016-05-09 LAB — URINE CULTURE: Culture: 70000 — AB

## 2016-05-10 ENCOUNTER — Telehealth (HOSPITAL_BASED_OUTPATIENT_CLINIC_OR_DEPARTMENT_OTHER): Payer: Self-pay

## 2016-05-10 NOTE — Telephone Encounter (Signed)
Post ED Visit - Positive Culture Follow-up  Culture report reviewed by antimicrobial stewardship pharmacist:  []  Elenor Quinones, Pharm.D. []  Heide Guile, Pharm.D., BCPS []  Parks Neptune, Pharm.D. []  Alycia Rossetti, Pharm.D., BCPS []  [place], [place].D., BCPS, AAHIVP []  Legrand Como, Pharm.D., BCPS, AAHIVP []  Milus Glazier, Pharm.D. []  Stephens November, Pharm.D. [facility] D Positive urine culture Treated with Cephalexin, organism sensitive to the same and no further patient follow-up is required at this time.  Genia Del 05/10/2016, 12:12 PM

## 2016-05-20 DIAGNOSIS — M0600 Rheumatoid arthritis without rheumatoid factor, unspecified site: Secondary | ICD-10-CM | POA: Diagnosis not present

## 2016-05-20 DIAGNOSIS — Z79899 Other long term (current) drug therapy: Secondary | ICD-10-CM | POA: Diagnosis not present

## 2016-05-20 DIAGNOSIS — E119 Type 2 diabetes mellitus without complications: Secondary | ICD-10-CM | POA: Diagnosis not present

## 2016-05-20 DIAGNOSIS — Z794 Long term (current) use of insulin: Secondary | ICD-10-CM | POA: Diagnosis not present

## 2016-05-20 DIAGNOSIS — Z23 Encounter for immunization: Secondary | ICD-10-CM | POA: Diagnosis not present

## 2016-05-20 DIAGNOSIS — G8929 Other chronic pain: Secondary | ICD-10-CM | POA: Diagnosis not present

## 2016-05-20 DIAGNOSIS — M064 Inflammatory polyarthropathy: Secondary | ICD-10-CM | POA: Diagnosis not present

## 2016-05-20 DIAGNOSIS — F329 Major depressive disorder, single episode, unspecified: Secondary | ICD-10-CM | POA: Diagnosis not present

## 2016-06-09 DIAGNOSIS — Z23 Encounter for immunization: Secondary | ICD-10-CM | POA: Diagnosis not present

## 2016-06-09 DIAGNOSIS — Z0000 Encounter for general adult medical examination without abnormal findings: Secondary | ICD-10-CM | POA: Diagnosis not present

## 2016-06-09 DIAGNOSIS — E7800 Pure hypercholesterolemia, unspecified: Secondary | ICD-10-CM | POA: Diagnosis not present

## 2016-06-09 DIAGNOSIS — E119 Type 2 diabetes mellitus without complications: Secondary | ICD-10-CM | POA: Diagnosis not present

## 2016-06-09 DIAGNOSIS — R51 Headache: Secondary | ICD-10-CM | POA: Diagnosis not present

## 2016-07-03 DIAGNOSIS — N390 Urinary tract infection, site not specified: Secondary | ICD-10-CM | POA: Diagnosis not present

## 2016-07-03 DIAGNOSIS — K120 Recurrent oral aphthae: Secondary | ICD-10-CM | POA: Diagnosis not present

## 2016-07-30 ENCOUNTER — Ambulatory Visit
Admission: RE | Admit: 2016-07-30 | Discharge: 2016-07-30 | Disposition: A | Discharge status: Home / Self Care | Payer: Medicare Other | Source: Ambulatory Visit | Attending: Family Medicine | Admitting: Family Medicine

## 2016-07-30 ENCOUNTER — Other Ambulatory Visit: Payer: Self-pay | Admitting: Physician Assistant

## 2016-07-30 ENCOUNTER — Other Ambulatory Visit: Payer: Self-pay | Admitting: Family Medicine

## 2016-07-30 DIAGNOSIS — K5900 Constipation, unspecified: Secondary | ICD-10-CM

## 2016-07-30 DIAGNOSIS — R109 Unspecified abdominal pain: Secondary | ICD-10-CM

## 2016-07-30 DIAGNOSIS — K5909 Other constipation: Secondary | ICD-10-CM | POA: Diagnosis not present

## 2016-07-30 DIAGNOSIS — R1084 Generalized abdominal pain: Secondary | ICD-10-CM | POA: Diagnosis not present

## 2016-08-04 DIAGNOSIS — K5902 Outlet dysfunction constipation: Secondary | ICD-10-CM | POA: Diagnosis not present

## 2016-08-25 DIAGNOSIS — R7309 Other abnormal glucose: Secondary | ICD-10-CM | POA: Diagnosis not present

## 2016-08-25 DIAGNOSIS — G8929 Other chronic pain: Secondary | ICD-10-CM | POA: Diagnosis not present

## 2016-08-25 DIAGNOSIS — E7800 Pure hypercholesterolemia, unspecified: Secondary | ICD-10-CM | POA: Diagnosis not present

## 2016-08-25 DIAGNOSIS — M545 Low back pain: Secondary | ICD-10-CM | POA: Diagnosis not present

## 2016-08-25 DIAGNOSIS — M1990 Unspecified osteoarthritis, unspecified site: Secondary | ICD-10-CM | POA: Diagnosis not present

## 2016-08-25 DIAGNOSIS — M0600 Rheumatoid arthritis without rheumatoid factor, unspecified site: Secondary | ICD-10-CM | POA: Diagnosis not present

## 2016-08-25 DIAGNOSIS — Z9229 Personal history of other drug therapy: Secondary | ICD-10-CM | POA: Diagnosis not present

## 2016-08-25 DIAGNOSIS — R350 Frequency of micturition: Secondary | ICD-10-CM | POA: Diagnosis not present

## 2016-08-25 DIAGNOSIS — M549 Dorsalgia, unspecified: Secondary | ICD-10-CM | POA: Diagnosis not present

## 2016-09-22 DIAGNOSIS — R8290 Unspecified abnormal findings in urine: Secondary | ICD-10-CM | POA: Diagnosis not present

## 2016-11-04 DIAGNOSIS — Z3202 Encounter for pregnancy test, result negative: Secondary | ICD-10-CM | POA: Diagnosis not present

## 2016-11-04 DIAGNOSIS — K3184 Gastroparesis: Secondary | ICD-10-CM | POA: Diagnosis not present

## 2016-11-04 DIAGNOSIS — R1031 Right lower quadrant pain: Secondary | ICD-10-CM | POA: Diagnosis not present

## 2016-11-04 DIAGNOSIS — Z794 Long term (current) use of insulin: Secondary | ICD-10-CM | POA: Diagnosis not present

## 2016-11-04 DIAGNOSIS — N800 Endometriosis of uterus: Secondary | ICD-10-CM | POA: Diagnosis not present

## 2016-11-04 DIAGNOSIS — I10 Essential (primary) hypertension: Secondary | ICD-10-CM | POA: Diagnosis not present

## 2016-11-04 DIAGNOSIS — E1143 Type 2 diabetes mellitus with diabetic autonomic (poly)neuropathy: Secondary | ICD-10-CM | POA: Diagnosis not present

## 2016-11-04 DIAGNOSIS — N809 Endometriosis, unspecified: Secondary | ICD-10-CM | POA: Diagnosis not present

## 2016-11-10 DIAGNOSIS — E1143 Type 2 diabetes mellitus with diabetic autonomic (poly)neuropathy: Secondary | ICD-10-CM | POA: Diagnosis not present

## 2016-11-10 DIAGNOSIS — Z9229 Personal history of other drug therapy: Secondary | ICD-10-CM | POA: Diagnosis not present

## 2016-11-10 DIAGNOSIS — G8929 Other chronic pain: Secondary | ICD-10-CM | POA: Diagnosis not present

## 2016-11-10 DIAGNOSIS — M069 Rheumatoid arthritis, unspecified: Secondary | ICD-10-CM | POA: Diagnosis not present

## 2016-11-10 DIAGNOSIS — M797 Fibromyalgia: Secondary | ICD-10-CM | POA: Diagnosis not present

## 2016-11-10 DIAGNOSIS — M25559 Pain in unspecified hip: Secondary | ICD-10-CM | POA: Diagnosis not present

## 2016-11-10 DIAGNOSIS — K3184 Gastroparesis: Secondary | ICD-10-CM | POA: Diagnosis not present

## 2016-11-10 DIAGNOSIS — M0600 Rheumatoid arthritis without rheumatoid factor, unspecified site: Secondary | ICD-10-CM | POA: Diagnosis not present

## 2016-11-10 DIAGNOSIS — M1990 Unspecified osteoarthritis, unspecified site: Secondary | ICD-10-CM | POA: Diagnosis not present

## 2016-11-17 DIAGNOSIS — M1990 Unspecified osteoarthritis, unspecified site: Secondary | ICD-10-CM | POA: Diagnosis not present

## 2016-11-17 DIAGNOSIS — M150 Primary generalized (osteo)arthritis: Secondary | ICD-10-CM | POA: Insufficient documentation

## 2016-11-17 DIAGNOSIS — G8929 Other chronic pain: Secondary | ICD-10-CM | POA: Diagnosis not present

## 2016-12-03 DIAGNOSIS — R300 Dysuria: Secondary | ICD-10-CM | POA: Diagnosis not present

## 2016-12-03 DIAGNOSIS — K5902 Outlet dysfunction constipation: Secondary | ICD-10-CM | POA: Diagnosis not present

## 2016-12-03 DIAGNOSIS — K589 Irritable bowel syndrome without diarrhea: Secondary | ICD-10-CM | POA: Diagnosis not present

## 2016-12-03 DIAGNOSIS — K565 Intestinal adhesions [bands], unspecified as to partial versus complete obstruction: Secondary | ICD-10-CM | POA: Diagnosis not present

## 2016-12-03 DIAGNOSIS — N805 Endometriosis of intestine: Secondary | ICD-10-CM | POA: Diagnosis not present

## 2016-12-09 ENCOUNTER — Other Ambulatory Visit: Payer: Self-pay | Admitting: Obstetrics and Gynecology

## 2016-12-09 DIAGNOSIS — R1031 Right lower quadrant pain: Secondary | ICD-10-CM | POA: Diagnosis not present

## 2017-01-07 ENCOUNTER — Emergency Department (HOSPITAL_COMMUNITY)
Admission: EM | Admit: 2017-01-07 | Discharge: 2017-01-07 | Disposition: A | Discharge status: Home / Self Care | Payer: BLUE CROSS/BLUE SHIELD | Attending: Emergency Medicine | Admitting: Emergency Medicine

## 2017-01-07 ENCOUNTER — Encounter (HOSPITAL_COMMUNITY): Payer: Self-pay | Admitting: *Deleted

## 2017-01-07 DIAGNOSIS — Z791 Long term (current) use of non-steroidal anti-inflammatories (NSAID): Secondary | ICD-10-CM | POA: Insufficient documentation

## 2017-01-07 DIAGNOSIS — Z79899 Other long term (current) drug therapy: Secondary | ICD-10-CM | POA: Diagnosis not present

## 2017-01-07 DIAGNOSIS — Z794 Long term (current) use of insulin: Secondary | ICD-10-CM | POA: Insufficient documentation

## 2017-01-07 DIAGNOSIS — K5641 Fecal impaction: Secondary | ICD-10-CM | POA: Diagnosis not present

## 2017-01-07 DIAGNOSIS — E119 Type 2 diabetes mellitus without complications: Secondary | ICD-10-CM | POA: Diagnosis not present

## 2017-01-07 DIAGNOSIS — K649 Unspecified hemorrhoids: Secondary | ICD-10-CM | POA: Diagnosis not present

## 2017-01-07 DIAGNOSIS — K644 Residual hemorrhoidal skin tags: Secondary | ICD-10-CM

## 2017-01-07 DIAGNOSIS — R1031 Right lower quadrant pain: Secondary | ICD-10-CM | POA: Diagnosis present

## 2017-01-07 LAB — CBC
HCT: 40.6 % (ref 36.0–46.0)
Hemoglobin: 12.5 g/dL (ref 12.0–15.0)
MCH: 25.9 pg — ABNORMAL LOW (ref 26.0–34.0)
MCHC: 30.8 g/dL (ref 30.0–36.0)
MCV: 84.1 fL (ref 78.0–100.0)
Platelets: 337 10*3/uL (ref 150–400)
RBC: 4.83 MIL/uL (ref 3.87–5.11)
RDW: 14.7 % (ref 11.5–15.5)
WBC: 9.2 10*3/uL (ref 4.0–10.5)

## 2017-01-07 LAB — URINALYSIS, ROUTINE W REFLEX MICROSCOPIC
Bilirubin Urine: NEGATIVE
Glucose, UA: >=500 mg/dL — AB
Ketones, ur: 20 mg/dL — AB
Leukocytes, UA: NEGATIVE
Nitrite: NEGATIVE
Protein, ur: NEGATIVE mg/dL
Specific Gravity, Urine: 1.029 (ref 1.005–1.030)
pH: 5 (ref 5.0–8.0)

## 2017-01-07 LAB — LIPASE, BLOOD: Lipase: 22 U/L (ref 11–51)

## 2017-01-07 LAB — COMPREHENSIVE METABOLIC PANEL
ALT: 16 U/L (ref 14–54)
AST: 24 U/L (ref 15–41)
Albumin: 4.2 g/dL (ref 3.5–5.0)
Alkaline Phosphatase: 85 U/L (ref 38–126)
Anion gap: 11 (ref 5–15)
BUN: 14 mg/dL (ref 6–20)
CO2: 25 mmol/L (ref 22–32)
Calcium: 9 mg/dL (ref 8.9–10.3)
Chloride: 97 mmol/L — ABNORMAL LOW (ref 101–111)
Creatinine, Ser: 0.66 mg/dL (ref 0.44–1.00)
GFR calc Af Amer: >60 mL/min (ref >60)
GFR calc non Af Amer: >60 mL/min (ref >60)
Glucose, Bld: 377 mg/dL — ABNORMAL HIGH (ref 65–99)
Potassium: 4.5 mmol/L (ref 3.5–5.1)
Sodium: 133 mmol/L — ABNORMAL LOW (ref 135–145)
Total Bilirubin: 0.8 mg/dL (ref 0.3–1.2)
Total Protein: 8 g/dL (ref 6.5–8.1)

## 2017-01-07 NOTE — ED Triage Notes (Signed)
Pt has bowel issues and has stool right at rectum and feels impacted.  Pt reports discomfort and hard to sit.  Not passing gas today

## 2017-01-07 NOTE — ED Provider Notes (Signed)
Tiffany [name] Provider Note   CSN: [id number] Arrival date & time: 01/07/17  1110     History   Chief Complaint Chief Complaint  Patient presents with  . Abdominal Pain    HPI [place] L Uselman is a 46 y.o. female.  HPI  46 y.o. female with a hx of HLD, presents to the Emergency Department today due to decrease BMs and RLQ abdominal pain x 2 days. Noted hx same. No hx SBO. Pt states she is followed by [facility] due to tissue build up in colon that may be attributing to the decrease in BMs. Pt states that she might have surgery. Notes N/V yesterday. None today. Notes flatus yesterday, but none today. No CP/SOB. Rates pain 10/10 and cramping sensation. No vaginal bleeding/discharge. No dysuria. Pt attempted fleet enema and laxative with minimal relief. No other symptoms noted.    Past Medical History:  Diagnosis Date  . Arthritis    knees, lower back right side  . Connective tissue disease ([place])   . Depression   . Diabetes mellitus   . Gastroparesis   . Headache(784.0)   . Heart murmur    as a child - no problem as adult  . History of kidney stones    no surgery  . Hyperlipidemia   . Lupus     There are no active problems to display for this patient.   Past Surgical History:  Procedure Laterality Date  . [place], 2006   x 2  . COLONOSCOPY WITH PROPOFOL N/A 03/16/2013   Procedure: COLONOSCOPY WITH PROPOFOL;  Surgeon: Arta Silence, MD;  Location: [facility];  Service: Endoscopy;  Laterality: N/A;  . HYSTEROSCOPY W/D&C N/A 07/11/2013   Procedure: DILATATION AND CURETTAGE /HYSTEROSCOPY;  Surgeon: Marvene Staff, MD;  Location: [facility];  Service: Gynecology;  Laterality: N/A;  . ROBOTIC ASSISTED LAPAROSCOPIC LYSIS OF ADHESION N/A 07/11/2013   Procedure: ROBOTIC ASSISTED LAPAROSCOPIC LYSIS OF ADHESIONS ; REPAIR OF UTERINE DEHISENCE;  Surgeon: Marvene Staff, MD;  Location: [facility];  Service: Gynecology;  Laterality: N/A;  . TUBAL  LIGATION      OB History    No data available       Home Medications    Prior to Admission medications   Medication Sig Start Date End Date Taking? Authorizing Provider  Abatacept (ORENCIA) 125 MG/ML SOSY Inject 125 mg into the skin every 14 (fourteen) days. 11/19/15 02/17/16  [provider]  atorvastatin (LIPITOR) 40 MG tablet Take 40 mg by mouth every morning.     [provider]  bisacodyl (STIMULANT LAXATIVE) 5 MG EC tablet Take 5 mg by mouth 3 (three) times daily as needed (for constipation).     [provider]  cephALEXin (KEFLEX) 500 MG capsule Take 1 capsule (500 mg total) by mouth 4 (four) times daily. 05/06/16   Fredia Sorrow, MD  clobetasol ointment (TEMOVATE) 3.33 % Apply 1 application topically 2 (two) times daily as needed. Skin irritations 03/15/15   [provider]  cyclobenzaprine (FLEXERIL) 10 MG tablet Take 10 mg by mouth 3 (three) times daily as needed. Muscle spasms 12/01/15   [provider]  DULoxetine (CYMBALTA) 30 MG capsule Take 30 mg by mouth daily. 10/23/15   [provider]  erythromycin (E-MYCIN) 250 MG tablet Take 250 mg by mouth 4 (four) times daily - after meals and at bedtime. 01/02/16   [provider]  glucagon (GLUCAGON EMERGENCY) 1 MG injection Inject 1 mg into  the skin once as needed. For low blood sugar 12/13/15   [provider]  HYDROcodone-acetaminophen (NORCO/VICODIN) 5-325 MG tablet Take 1-2 tablets by mouth every 6 (six) hours as needed for moderate pain. 05/06/16   Fredia Sorrow, MD  hydrocortisone (ANUSOL-HC) 25 MG suppository Place 25 mg rectally 2 (two) times daily as needed. hemorrhoids 10/02/15   [provider]  hydrocortisone (PROCTOSOL HC) 2.5 % rectal cream Place 1 application rectally daily as needed. hemorrhoids 12/11/14   [provider]  hydroxychloroquine (PLAQUENIL) 200 MG tablet Take 200 mg by mouth every morning.     [provider]    Insulin Glargine (TOUJEO SOLOSTAR) 300 UNIT/ML SOPN Inject 30 Units into the skin every morning.    [provider]  insulin glulisine (APIDRA) 100 UNIT/ML injection Inject 8 Units into the skin 3 (three) times daily before meals.     [provider]  linaclotide (LINZESS) 290 MCG CAPS capsule Take 290 mg by mouth daily.    [provider]  metoCLOPramide (REGLAN) 10 MG tablet Take 10 mg by mouth daily as needed. n/v 05/17/15   [provider]  Multiple Vitamin (MULTI-VITAMINS) TABS Take 1 tablet by mouth daily.    [provider]  naproxen (NAPROSYN) 500 MG tablet Take 1 tablet (500 mg total) by mouth 2 (two) times daily with a meal. 01/26/16   Gareth Morgan, MD  ondansetron (ZOFRAN ODT) 4 MG disintegrating tablet Take 1 tablet (4 mg total) by mouth every 8 (eight) hours as needed for nausea or vomiting. 05/06/16   Fredia Sorrow, MD  promethazine (PHENERGAN) 25 MG tablet Take 1 tablet (25 mg total) by mouth every 6 (six) hours as needed for nausea or vomiting. 05/06/16   Fredia Sorrow, MD  sennosides-docusate sodium (SENOKOT-S) 8.6-50 MG tablet Take 1 tablet by mouth 2 (two) times daily as needed for constipation.    [provider]  Sod Picosulfate-Mag Ox-Cit Acd ([place]) 10-3.5-12 MG-GM-GM PACK Take 1 packet by mouth daily as needed. constipation 05/14/15   [provider]  zolpidem (AMBIEN CR) 12.5 MG CR tablet Take 1 tablet (12.5 mg total) by mouth at bedtime as needed for sleep. 07/11/13   Servando Salina, MD    Family History No family history on file.  Social History Social History  Substance Use Topics  . Smoking status: Never Smoker  . Smokeless tobacco: Never Used  . Alcohol use No     Allergies   Morphine and related and Tramadol   Review of Systems Review of Systems ROS reviewed and all are negative for acute change except as noted in the HPI.  Physical Exam Updated Vital Signs BP 140/79 (BP  Location: Right Arm)   Pulse (!) 108   Temp 98.4 F (36.9 C) (Oral)   Resp 20   LMP 01/03/2017   SpO2 100%   Physical Exam  Constitutional: She is oriented to person, place, and time. Vital signs are normal. She appears well-developed and well-nourished.  HENT:  Head: Normocephalic and atraumatic.  Right Ear: Hearing normal.  Left Ear: Hearing normal.  Eyes: Conjunctivae and EOM are normal. Pupils are equal, round, and reactive to light.  Neck: Normal range of motion.  Cardiovascular: Normal rate, regular rhythm, normal heart sounds and intact distal pulses.   Pulmonary/Chest: Effort normal and breath sounds normal. No respiratory distress. She has no wheezes. She has no rales. She exhibits no tenderness.  Abdominal: Soft. Normal appearance and bowel sounds are normal. There is tenderness  in the right lower quadrant. There is no rigidity, no rebound, no guarding, no CVA tenderness, no tenderness at McBurney's point and negative Murphy's sign.  Abdomen soft  Genitourinary:  Genitourinary Comments: Chaperone was present. Patient with mild pain around the rectal area. There are no external fissures noted. No induration of the skin or swelling. External hemorrhoid seen. Non thrombosed. Patient able to tolerate examination. I was able to feel the first 2-3cm of the rectum digitally with significant stool impaction noted. There is no gross blood.  No signs of perirectal abscess.  Musculoskeletal: Normal range of motion.  Neurological: She is alert and oriented to person, place, and time.  Skin: Skin is warm and dry.  Psychiatric: She has a normal mood and affect. Her speech is normal and behavior is normal. Thought content normal.  Nursing note and vitals reviewed.    ED Treatments / Results  Labs (all labs ordered are listed, but only abnormal results are displayed) Labs Reviewed  CBC - Abnormal; Notable for the following:       Result Value   MCH 25.9 (*)    All other components  within normal limits  COMPREHENSIVE METABOLIC PANEL - Abnormal; Notable for the following:    Sodium 133 (*)    Chloride 97 (*)    Glucose, Bld 377 (*)    All other components within normal limits  URINALYSIS, ROUTINE W REFLEX MICROSCOPIC - Abnormal; Notable for the following:    Glucose, UA >=500 (*)    Hgb urine dipstick LARGE (*)    Ketones, ur 20 (*)    Bacteria, UA RARE (*)    Squamous Epithelial / LPF 0-5 (*)    All other components within normal limits  LIPASE, BLOOD    EKG  EKG Interpretation None       Radiology No results found.  Procedures Procedures (including critical care time)  Medications Ordered in ED Medications - No data to display   Initial Impression / Assessment and Plan / ED Course  I have reviewed the triage vital signs and the nursing notes.  Pertinent labs & imaging results that were available during my care of the patient were reviewed by me and considered in my medical decision making (see chart for details).  Final Clinical Impressions(s) / ED Diagnoses  {I have reviewed and evaluated the relevant laboratory values.   {I have reviewed the relevant previous healthcare records.  {I obtained HPI from historian.   ED Course:  Assessment: Pt is a 45 y.o. female who presents due to decrease BMs and RLQ abdominal pain x 2 days. Noted hx same. No hx SBO. Pt states she is followed by [facility] due to tissue build up in colon that may be attributing to the decrease in BMs. Pt states that she might have surgery. Notes N/V yesterday. None today. . On exam, pt in NAD. Nontoxic/nonseptic appearing. VSS. Afebrile. Lungs CTA. Heart RRR. Abdomen TTP RLQ and diffusely. Abdomen soft. DRE exam with small non thrombosed hemorrhoid. Significant stool impaction noted on DRE. CBC unremarkable. NO leukocytosis. CMP unremarkable. Lipase negative. UA unremarkable. Due to significant impaction, provided soap suds enema, with significant relief with copious stool  obtained. Plan is to [place] with follow up to [facility]. At time of discharge, Patient is in no acute distress. Vital Signs are stable. Patient is able to ambulate. Patient able to tolerate PO.   Disposition/Plan:  DC Home Additional Verbal discharge instructions given and discussed with patient.  Pt Instructed to f/u with GI in the next week for evaluation and treatment of symptoms. Return precautions given Pt acknowledges and agrees with plan  Supervising Physician Davonna Belling, MD  Final diagnoses:  Fecal impaction [facility])  External hemorrhoid    New Prescriptions New Prescriptions   No medications on file     Shary Decamp, Hershal Coria 01/07/17 Jessamine, Nathan, MD 01/07/17 1627
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
## 2017-01-12 ENCOUNTER — Other Ambulatory Visit: Payer: Self-pay | Admitting: Obstetrics and Gynecology

## 2017-01-13 DIAGNOSIS — Z6821 Body mass index (BMI) 21.0-21.9, adult: Secondary | ICD-10-CM | POA: Diagnosis not present

## 2017-01-13 DIAGNOSIS — R102 Pelvic and perineal pain: Secondary | ICD-10-CM | POA: Diagnosis not present

## 2017-01-13 DIAGNOSIS — G8929 Other chronic pain: Secondary | ICD-10-CM | POA: Diagnosis not present

## 2017-02-11 ENCOUNTER — Emergency Department (HOSPITAL_COMMUNITY): Payer: BLUE CROSS/BLUE SHIELD

## 2017-02-11 ENCOUNTER — Encounter (HOSPITAL_COMMUNITY): Payer: Self-pay | Admitting: Emergency Medicine

## 2017-02-11 ENCOUNTER — Emergency Department (HOSPITAL_COMMUNITY)
Admission: EM | Admit: 2017-02-11 | Discharge: 2017-02-11 | Disposition: A | Discharge status: Home / Self Care | Payer: BLUE CROSS/BLUE SHIELD | Attending: Emergency Medicine | Admitting: Emergency Medicine

## 2017-02-11 DIAGNOSIS — M549 Dorsalgia, unspecified: Secondary | ICD-10-CM | POA: Insufficient documentation

## 2017-02-11 DIAGNOSIS — R079 Chest pain, unspecified: Secondary | ICD-10-CM | POA: Diagnosis not present

## 2017-02-11 DIAGNOSIS — Z5321 Procedure and treatment not carried out due to patient leaving prior to being seen by health care provider: Secondary | ICD-10-CM | POA: Insufficient documentation

## 2017-02-11 LAB — BASIC METABOLIC PANEL
Anion gap: 10 (ref 5–15)
BUN: 15 mg/dL (ref 6–20)
CO2: 27 mmol/L (ref 22–32)
Calcium: 9.3 mg/dL (ref 8.9–10.3)
Chloride: 96 mmol/L — ABNORMAL LOW (ref 101–111)
Creatinine, Ser: 0.67 mg/dL (ref 0.44–1.00)
GFR calc Af Amer: >60 mL/min (ref >60)
GFR calc non Af Amer: >60 mL/min (ref >60)
Glucose, Bld: 371 mg/dL — ABNORMAL HIGH (ref 65–99)
Potassium: 3.7 mmol/L (ref 3.5–5.1)
Sodium: 133 mmol/L — ABNORMAL LOW (ref 135–145)

## 2017-02-11 LAB — CBC
HCT: 37.6 % (ref 36.0–46.0)
Hemoglobin: 11.7 g/dL — ABNORMAL LOW (ref 12.0–15.0)
MCH: 25.4 pg — ABNORMAL LOW (ref 26.0–34.0)
MCHC: 31.1 g/dL (ref 30.0–36.0)
MCV: 81.6 fL (ref 78.0–100.0)
Platelets: 413 10*3/uL — ABNORMAL HIGH (ref 150–400)
RBC: 4.61 MIL/uL (ref 3.87–5.11)
RDW: 14.7 % (ref 11.5–15.5)
WBC: 6.8 10*3/uL (ref 4.0–10.5)

## 2017-02-11 LAB — I-STAT TROPONIN, ED: Troponin i, poc: 0 ng/mL (ref 0.00–0.08)

## 2017-02-11 MED ORDER — FENTANYL CITRATE (PF) 100 MCG/2ML IJ SOLN
INTRAMUSCULAR | Status: AC
  Filled 2017-02-11: qty 2

## 2017-02-11 MED ORDER — MIDAZOLAM HCL 2 MG/2ML IJ SOLN
INTRAMUSCULAR | Status: AC
  Filled 2017-02-11: qty 2

## 2017-02-11 NOTE — ED Triage Notes (Signed)
Pt c/o stabbing type pain in upper back, onset earlier today also st's now she is having a stabbing type pain in mid upper chest.

## 2017-02-11 NOTE — ED Notes (Signed)
Pt states she is going to see her doctor later this morning.

## 2017-02-13 DIAGNOSIS — M359 Systemic involvement of connective tissue, unspecified: Secondary | ICD-10-CM | POA: Diagnosis not present

## 2017-02-13 DIAGNOSIS — Z794 Long term (current) use of insulin: Secondary | ICD-10-CM | POA: Diagnosis not present

## 2017-02-13 DIAGNOSIS — E1165 Type 2 diabetes mellitus with hyperglycemia: Secondary | ICD-10-CM | POA: Diagnosis not present

## 2017-02-13 DIAGNOSIS — R079 Chest pain, unspecified: Secondary | ICD-10-CM | POA: Diagnosis not present

## 2017-03-05 DIAGNOSIS — R1084 Generalized abdominal pain: Secondary | ICD-10-CM | POA: Diagnosis not present

## 2017-03-05 DIAGNOSIS — K5902 Outlet dysfunction constipation: Secondary | ICD-10-CM | POA: Diagnosis not present

## 2017-03-17 DIAGNOSIS — G8929 Other chronic pain: Secondary | ICD-10-CM | POA: Diagnosis not present

## 2017-03-17 DIAGNOSIS — M359 Systemic involvement of connective tissue, unspecified: Secondary | ICD-10-CM | POA: Diagnosis not present

## 2017-03-18 DIAGNOSIS — M359 Systemic involvement of connective tissue, unspecified: Secondary | ICD-10-CM | POA: Diagnosis not present

## 2017-03-18 DIAGNOSIS — G8929 Other chronic pain: Secondary | ICD-10-CM | POA: Diagnosis not present

## 2017-04-02 DIAGNOSIS — E1065 Type 1 diabetes mellitus with hyperglycemia: Secondary | ICD-10-CM | POA: Diagnosis not present

## 2017-04-02 DIAGNOSIS — E1043 Type 1 diabetes mellitus with diabetic autonomic (poly)neuropathy: Secondary | ICD-10-CM | POA: Diagnosis not present

## 2017-04-02 DIAGNOSIS — E10649 Type 1 diabetes mellitus with hypoglycemia without coma: Secondary | ICD-10-CM | POA: Diagnosis not present

## 2017-04-02 DIAGNOSIS — K3184 Gastroparesis: Secondary | ICD-10-CM | POA: Diagnosis not present

## 2017-04-02 DIAGNOSIS — Z794 Long term (current) use of insulin: Secondary | ICD-10-CM | POA: Diagnosis not present

## 2017-04-02 DIAGNOSIS — Z79899 Other long term (current) drug therapy: Secondary | ICD-10-CM | POA: Diagnosis not present

## 2017-04-02 DIAGNOSIS — E1040 Type 1 diabetes mellitus with diabetic neuropathy, unspecified: Secondary | ICD-10-CM | POA: Diagnosis not present

## 2017-04-03 DIAGNOSIS — IMO0002 Reserved for concepts with insufficient information to code with codable children: Secondary | ICD-10-CM

## 2017-04-03 DIAGNOSIS — E108 Type 1 diabetes mellitus with unspecified complications: Secondary | ICD-10-CM | POA: Insufficient documentation

## 2017-04-03 DIAGNOSIS — E1065 Type 1 diabetes mellitus with hyperglycemia: Secondary | ICD-10-CM | POA: Insufficient documentation

## 2017-04-03 DIAGNOSIS — E109 Type 1 diabetes mellitus without complications: Secondary | ICD-10-CM | POA: Insufficient documentation

## 2017-04-03 HISTORY — DX: Reserved for concepts with insufficient information to code with codable children: IMO0002

## 2017-04-20 DIAGNOSIS — G8929 Other chronic pain: Secondary | ICD-10-CM | POA: Diagnosis not present

## 2017-04-20 DIAGNOSIS — M359 Systemic involvement of connective tissue, unspecified: Secondary | ICD-10-CM | POA: Diagnosis not present

## 2017-04-20 DIAGNOSIS — F322 Major depressive disorder, single episode, severe without psychotic features: Secondary | ICD-10-CM | POA: Diagnosis not present

## 2017-04-28 DIAGNOSIS — R300 Dysuria: Secondary | ICD-10-CM | POA: Diagnosis not present

## 2017-04-28 DIAGNOSIS — N644 Mastodynia: Secondary | ICD-10-CM | POA: Diagnosis not present

## 2017-04-28 DIAGNOSIS — K5902 Outlet dysfunction constipation: Secondary | ICD-10-CM | POA: Diagnosis not present

## 2017-04-28 DIAGNOSIS — M791 Myalgia, unspecified site: Secondary | ICD-10-CM | POA: Diagnosis not present

## 2017-05-18 DIAGNOSIS — G8929 Other chronic pain: Secondary | ICD-10-CM | POA: Diagnosis not present

## 2017-05-21 DIAGNOSIS — E162 Hypoglycemia, unspecified: Secondary | ICD-10-CM | POA: Diagnosis not present

## 2017-05-21 DIAGNOSIS — E1043 Type 1 diabetes mellitus with diabetic autonomic (poly)neuropathy: Secondary | ICD-10-CM | POA: Diagnosis not present

## 2017-05-21 DIAGNOSIS — E1065 Type 1 diabetes mellitus with hyperglycemia: Secondary | ICD-10-CM | POA: Diagnosis not present

## 2017-05-21 DIAGNOSIS — K3184 Gastroparesis: Secondary | ICD-10-CM | POA: Diagnosis not present

## 2017-05-21 DIAGNOSIS — Z79899 Other long term (current) drug therapy: Secondary | ICD-10-CM | POA: Diagnosis not present

## 2017-05-21 DIAGNOSIS — E109 Type 1 diabetes mellitus without complications: Secondary | ICD-10-CM | POA: Diagnosis not present

## 2017-05-21 DIAGNOSIS — E10649 Type 1 diabetes mellitus with hypoglycemia without coma: Secondary | ICD-10-CM | POA: Diagnosis not present

## 2017-05-21 DIAGNOSIS — Z794 Long term (current) use of insulin: Secondary | ICD-10-CM | POA: Diagnosis not present

## 2017-05-25 DIAGNOSIS — K5902 Outlet dysfunction constipation: Secondary | ICD-10-CM | POA: Diagnosis not present

## 2017-05-25 DIAGNOSIS — M791 Myalgia, unspecified site: Secondary | ICD-10-CM | POA: Diagnosis not present

## 2017-06-10 DIAGNOSIS — Z23 Encounter for immunization: Secondary | ICD-10-CM | POA: Diagnosis not present

## 2017-06-10 DIAGNOSIS — R51 Headache: Secondary | ICD-10-CM | POA: Diagnosis not present

## 2017-06-10 DIAGNOSIS — Z0000 Encounter for general adult medical examination without abnormal findings: Secondary | ICD-10-CM | POA: Diagnosis not present

## 2017-06-10 DIAGNOSIS — E7800 Pure hypercholesterolemia, unspecified: Secondary | ICD-10-CM | POA: Diagnosis not present

## 2017-06-14 DIAGNOSIS — R300 Dysuria: Secondary | ICD-10-CM | POA: Diagnosis not present

## 2017-06-16 DIAGNOSIS — M791 Myalgia, unspecified site: Secondary | ICD-10-CM | POA: Diagnosis not present

## 2017-07-02 DIAGNOSIS — E1065 Type 1 diabetes mellitus with hyperglycemia: Secondary | ICD-10-CM | POA: Diagnosis not present

## 2017-07-02 DIAGNOSIS — E1043 Type 1 diabetes mellitus with diabetic autonomic (poly)neuropathy: Secondary | ICD-10-CM | POA: Diagnosis not present

## 2017-07-02 DIAGNOSIS — I10 Essential (primary) hypertension: Secondary | ICD-10-CM | POA: Diagnosis not present

## 2017-07-02 DIAGNOSIS — K3184 Gastroparesis: Secondary | ICD-10-CM | POA: Diagnosis not present

## 2017-07-02 DIAGNOSIS — Z794 Long term (current) use of insulin: Secondary | ICD-10-CM | POA: Diagnosis not present

## 2017-07-15 DIAGNOSIS — M19012 Primary osteoarthritis, left shoulder: Secondary | ICD-10-CM | POA: Diagnosis not present

## 2017-07-15 DIAGNOSIS — M25512 Pain in left shoulder: Secondary | ICD-10-CM | POA: Diagnosis not present

## 2017-07-15 DIAGNOSIS — M797 Fibromyalgia: Secondary | ICD-10-CM | POA: Diagnosis not present

## 2017-07-15 DIAGNOSIS — Z79899 Other long term (current) drug therapy: Secondary | ICD-10-CM | POA: Diagnosis not present

## 2017-07-15 DIAGNOSIS — M1990 Unspecified osteoarthritis, unspecified site: Secondary | ICD-10-CM | POA: Diagnosis not present

## 2017-07-29 DIAGNOSIS — E103313 Type 1 diabetes mellitus with moderate nonproliferative diabetic retinopathy with macular edema, bilateral: Secondary | ICD-10-CM | POA: Diagnosis not present

## 2017-07-29 DIAGNOSIS — H2513 Age-related nuclear cataract, bilateral: Secondary | ICD-10-CM | POA: Diagnosis not present

## 2017-08-03 DIAGNOSIS — I10 Essential (primary) hypertension: Secondary | ICD-10-CM | POA: Diagnosis not present

## 2017-08-03 DIAGNOSIS — E1065 Type 1 diabetes mellitus with hyperglycemia: Secondary | ICD-10-CM | POA: Diagnosis not present

## 2017-08-03 DIAGNOSIS — E109 Type 1 diabetes mellitus without complications: Secondary | ICD-10-CM | POA: Diagnosis not present

## 2017-08-03 DIAGNOSIS — Z794 Long term (current) use of insulin: Secondary | ICD-10-CM | POA: Diagnosis not present

## 2017-08-13 DIAGNOSIS — R198 Other specified symptoms and signs involving the digestive system and abdomen: Secondary | ICD-10-CM | POA: Diagnosis not present

## 2017-08-13 DIAGNOSIS — K5902 Outlet dysfunction constipation: Secondary | ICD-10-CM | POA: Diagnosis not present

## 2017-09-04 ENCOUNTER — Emergency Department (HOSPITAL_COMMUNITY)
Admission: EM | Admit: 2017-09-04 | Discharge: 2017-09-05 | Disposition: A | Discharge status: Home / Self Care | Payer: BLUE CROSS/BLUE SHIELD | Attending: Emergency Medicine | Admitting: Emergency Medicine

## 2017-09-04 ENCOUNTER — Emergency Department (HOSPITAL_COMMUNITY): Payer: BLUE CROSS/BLUE SHIELD

## 2017-09-04 ENCOUNTER — Encounter (HOSPITAL_COMMUNITY): Payer: Self-pay | Admitting: *Deleted

## 2017-09-04 DIAGNOSIS — M545 Low back pain: Secondary | ICD-10-CM

## 2017-09-04 DIAGNOSIS — M25512 Pain in left shoulder: Secondary | ICD-10-CM

## 2017-09-04 DIAGNOSIS — Y999 Unspecified external cause status: Secondary | ICD-10-CM | POA: Insufficient documentation

## 2017-09-04 DIAGNOSIS — M5489 Other dorsalgia: Secondary | ICD-10-CM | POA: Diagnosis not present

## 2017-09-04 DIAGNOSIS — T148XXA Other injury of unspecified body region, initial encounter: Secondary | ICD-10-CM | POA: Diagnosis not present

## 2017-09-04 DIAGNOSIS — Z794 Long term (current) use of insulin: Secondary | ICD-10-CM | POA: Diagnosis not present

## 2017-09-04 DIAGNOSIS — S3992XA Unspecified injury of lower back, initial encounter: Secondary | ICD-10-CM | POA: Diagnosis not present

## 2017-09-04 DIAGNOSIS — Y92411 Interstate highway as the place of occurrence of the external cause: Secondary | ICD-10-CM | POA: Diagnosis not present

## 2017-09-04 DIAGNOSIS — Y9389 Activity, other specified: Secondary | ICD-10-CM | POA: Diagnosis not present

## 2017-09-04 DIAGNOSIS — M542 Cervicalgia: Secondary | ICD-10-CM

## 2017-09-04 DIAGNOSIS — E119 Type 2 diabetes mellitus without complications: Secondary | ICD-10-CM | POA: Insufficient documentation

## 2017-09-04 DIAGNOSIS — Z79899 Other long term (current) drug therapy: Secondary | ICD-10-CM | POA: Insufficient documentation

## 2017-09-04 DIAGNOSIS — S199XXA Unspecified injury of neck, initial encounter: Secondary | ICD-10-CM | POA: Diagnosis not present

## 2017-09-04 DIAGNOSIS — S4992XA Unspecified injury of left shoulder and upper arm, initial encounter: Secondary | ICD-10-CM | POA: Diagnosis not present

## 2017-09-04 LAB — CBG MONITORING, ED: Glucose-Capillary: 239 mg/dL — ABNORMAL HIGH (ref 65–99)
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
## 2017-09-05 NOTE — Discharge Instructions (Signed)
Your x-rays today were normal.  You may have some muscle soreness for a few days which is normal. Follow-up with your primary care doctor. Return here for any new/acute changes.

## 2017-09-06 ENCOUNTER — Emergency Department (HOSPITAL_COMMUNITY)
Admission: EM | Admit: 2017-09-06 | Discharge: 2017-09-06 | Disposition: A | Discharge status: Home / Self Care | Payer: BLUE CROSS/BLUE SHIELD | Attending: Emergency Medicine | Admitting: Emergency Medicine

## 2017-09-06 ENCOUNTER — Other Ambulatory Visit: Payer: Self-pay

## 2017-09-06 ENCOUNTER — Encounter (HOSPITAL_COMMUNITY): Payer: Self-pay

## 2017-09-06 DIAGNOSIS — K5900 Constipation, unspecified: Secondary | ICD-10-CM | POA: Insufficient documentation

## 2017-09-06 DIAGNOSIS — Z79899 Other long term (current) drug therapy: Secondary | ICD-10-CM | POA: Diagnosis not present

## 2017-09-06 DIAGNOSIS — E119 Type 2 diabetes mellitus without complications: Secondary | ICD-10-CM | POA: Insufficient documentation

## 2017-09-06 DIAGNOSIS — Z794 Long term (current) use of insulin: Secondary | ICD-10-CM | POA: Insufficient documentation

## 2017-09-06 MED ORDER — POLYETHYLENE GLYCOL 3350 17 G PO PACK: 17.0000 g | PACK | Freq: Every day | ORAL | 0 refills | Status: DC

## 2017-09-06 NOTE — ED Provider Notes (Signed)
[facility] Provider Note   CSN: [id number] Arrival date & time: 09/06/17  1626     History   Chief Complaint Chief Complaint  Patient presents with  . Constipation    HPI Tiffany [name] is a 47 y.o. female.  HPI  Patient with history of constipation presents with sensation that she needs to pass a bowel movement and is unable to.  Her last bowel movement was 5 days ago.  She states she did pass a small amount of stool today but continues to feel pressure and lower abdominal pain.  She denies any fever has not had any vomiting.  She takes Linzess and denies any recent changes in her food intake or medications.  She denies blood in her stool.  There are no other associated systemic symptoms, there are no other alleviating or modifying factors.   Past Medical History:  Diagnosis Date  . Arthritis    knees, lower back right side  . Connective tissue disease ([place])   . Depression   . Diabetes mellitus   . Gastroparesis   . Headache(784.0)   . Heart murmur    as a child - no problem as adult  . History of kidney stones    no surgery  . Hyperlipidemia   . Lupus     There are no active problems to display for this patient.   Past Surgical History:  Procedure Laterality Date  . [place], 2006   x 2  . COLONOSCOPY WITH PROPOFOL N/A 03/16/2013   Procedure: COLONOSCOPY WITH PROPOFOL;  Surgeon: Arta Silence, MD;  Location: [facility];  Service: Endoscopy;  Laterality: N/A;  . HYSTEROSCOPY W/D&C N/A 07/11/2013   Procedure: DILATATION AND CURETTAGE /HYSTEROSCOPY;  Surgeon: Marvene Staff, MD;  Location: [facility];  Service: Gynecology;  Laterality: N/A;  . ROBOTIC ASSISTED LAPAROSCOPIC LYSIS OF ADHESION N/A 07/11/2013   Procedure: ROBOTIC ASSISTED LAPAROSCOPIC LYSIS OF ADHESIONS ; REPAIR OF UTERINE DEHISENCE;  Surgeon: Marvene Staff, MD;  Location: [facility];  Service: Gynecology;  Laterality: N/A;  . TUBAL LIGATION       OB History    No data available       Home Medications    Prior to Admission medications   Medication Sig Start Date End Date Taking? Authorizing Provider  atorvastatin (LIPITOR) 40 MG tablet Take 40 mg by mouth every morning.     [provider]  bisacodyl (STIMULANT LAXATIVE) 5 MG EC tablet Take 5 mg by mouth 3 (three) times daily as needed (for constipation).     [provider]  cephALEXin (KEFLEX) 500 MG capsule Take 1 capsule (500 mg total) by mouth 4 (four) times daily. Patient not taking: Reported on 09/04/2017 05/06/16   Fredia Sorrow, MD  citalopram (CELEXA) 20 MG tablet TK 1 TABLET BY MOUTH DAILY 07/20/17   [provider]  clobetasol ointment (TEMOVATE) 4.56 % Apply 1 application topically 2 (two) times daily as needed. Skin irritations 03/15/15   [provider]  cyclobenzaprine (FLEXERIL) 10 MG tablet Take 10 mg by mouth 3 (three) times daily as needed. Muscle spasms 12/01/15   [provider]  erythromycin (E-MYCIN) 250 MG tablet Take 250 mg by mouth 4 (four) times daily - after meals and at bedtime. 01/02/16   [provider]  glucagon (GLUCAGON EMERGENCY) 1 MG injection Inject 1 mg into the skin once as needed. For low blood sugar 12/13/15   [provider]  HYDROcodone-acetaminophen (NORCO/VICODIN) 5-325 MG tablet Take 1-2 tablets by mouth every 6 (six) hours as needed for moderate pain. Patient not taking: Reported on 09/04/2017 05/06/16   Fredia Sorrow, MD  hydrocortisone (ANUSOL-HC) 25 MG suppository Place 25 mg rectally 2 (two) times daily as needed. hemorrhoids 10/02/15   [provider]  hydrocortisone (PROCTOSOL HC) 2.5 % rectal cream Place 1 application rectally daily as needed. hemorrhoids 12/11/14   [provider]  Insulin Glargine (TOUJEO SOLOSTAR) 300 UNIT/ML SOPN Inject 30 Units into the skin every morning.    [provider]  Insulin Glulisine (APIDRA SOLOSTAR) 100 UNIT/ML  Solostar Pen Inject 0-20 Units into the skin 3 (three) times daily.    [provider]  linaclotide (LINZESS) 290 MCG CAPS capsule Take 580 mg by mouth daily.     [provider]  metoCLOPramide (REGLAN) 10 MG tablet Take 10 mg by mouth daily as needed. n/v 05/17/15   [provider]  Multiple Vitamin (MULTI-VITAMINS) TABS Take 1 tablet by mouth daily.    [provider]  naproxen (NAPROSYN) 500 MG tablet Take 1 tablet (500 mg total) by mouth 2 (two) times daily with a meal. Patient not taking: Reported on 09/04/2017 01/26/16   Gareth Morgan, MD  ondansetron (ZOFRAN ODT) 4 MG disintegrating tablet Take 1 tablet (4 mg total) by mouth every 8 (eight) hours as needed for nausea or vomiting. Patient not taking: Reported on 09/04/2017 05/06/16   Fredia Sorrow, MD  polyethylene glycol [facility]) packet Take 17 g by mouth daily. 09/06/17   Mabe, Forbes Cellar, MD  promethazine (PHENERGAN) 25 MG tablet Take 1 tablet (25 mg total) by mouth every 6 (six) hours as needed for nausea or vomiting. Patient not taking: Reported on 09/04/2017 05/06/16   Fredia Sorrow, MD  sennosides-docusate sodium (SENOKOT-S) 8.6-50 MG tablet Take 1 tablet by mouth 2 (two) times daily as needed for constipation.    [provider]  Sod Picosulfate-Mag Ox-Cit Acd ([place]) 10-3.5-12 MG-GM-GM PACK Take 1 packet by mouth daily as needed. constipation 05/14/15   [provider]  SUMAtriptan (IMITREX) 25 MG tablet Take 25 mg by mouth every 2 (two) hours as needed for migraine. May repeat in 2 hours if headache persists or recurs.    [provider]  zolpidem (AMBIEN CR) 12.5 MG CR tablet Take 1 tablet (12.5 mg total) by mouth at bedtime as needed for sleep. 07/11/13   Servando Salina, MD    Family History No family history on file.  Social History Social History   Tobacco Use  . Smoking status: Never Smoker  . Smokeless tobacco: Never Used  Substance Use Topics  .  Alcohol use: No  . Drug use: No     Allergies   Gabapentin; Morphine and related; and Tramadol   Review of Systems Review of Systems  ROS reviewed and all otherwise negative except for mentioned in HPI   Physical Exam Updated Vital Signs BP 130/72   Pulse 97   Temp 98.3 F (36.8 C) (Oral)   Resp 16   Ht 5\' 7"  (1.702 m)   Wt 62.6 kg (138 lb)   SpO2 99%   BMI 21.61 kg/m  Vitals reviewed Physical Exam  Physical Examination: General appearance - alert, well appearing, and in no distress Mental status - alert, oriented to person, place, and time Eyes - no conjunctival injection, no scleral icterus Mouth - mucous membranes moist, pharynx normal without lesions Chest - clear to auscultation, no wheezes, rales  or rhonchi, symmetric air entry Heart - normal rate, regular rhythm, normal S1, S2, no murmurs, rubs, clicks or gallops Abdomen - soft, diffuse tenderness to palpation, no gaurding or rebound, nondistended, no masses or organomegaly Neurological - alert, oriented, normal speech, no focal findings or movement disorder noted Extremities - peripheral pulses normal, no pedal edema, no clubbing or cyanosis Skin - normal coloration and turgor, no rashes,    ED Treatments / Results  Labs (all labs ordered are listed, but only abnormal results are displayed) Labs Reviewed - No data to display  EKG  EKG Interpretation None       Radiology Dg Cervical Spine Complete  Result Date: 09/05/2017 CLINICAL DATA:  Pain after trauma EXAM: CERVICAL SPINE - COMPLETE 4+ VIEW COMPARISON:  None. FINDINGS: There is no evidence of cervical spine fracture or prevertebral soft tissue swelling. Alignment is normal. No other significant bone abnormalities are identified. IMPRESSION: Negative cervical spine radiographs. Electronically Signed   By: Dorise Bullion III M.D   On: 09/05/2017 00:00   Dg Lumbar Spine Complete  Result Date: 09/05/2017 CLINICAL DATA:  Pain after trauma EXAM: LUMBAR  SPINE - COMPLETE 4+ VIEW COMPARISON:  None. FINDINGS: There is no evidence of lumbar spine fracture. Alignment is normal. Intervertebral disc spaces are maintained. IMPRESSION: Negative. Electronically Signed   By: Dorise Bullion III M.D   On: 09/05/2017 00:01   Dg Shoulder Left  Result Date: 09/04/2017 CLINICAL DATA:  Pain after trauma EXAM: LEFT SHOULDER - 2+ VIEW COMPARISON:  None. FINDINGS: There is no evidence of fracture or dislocation. There is no evidence of arthropathy or other focal bone abnormality. Soft tissues are unremarkable. IMPRESSION: Negative. Electronically Signed   By: Dorise Bullion III M.D   On: 09/04/2017 23:59    Procedures Procedures (including critical care time)  Medications Ordered in ED Medications - No data to display   Initial Impression / Assessment and Plan / ED Course  I have reviewed the triage vital signs and the nursing notes.  Pertinent labs & imaging results that were available during my care of the patient were reviewed by me and considered in my medical decision making (see chart for details).    6:26 PM pt feeling much better after enema- she had large stool output.   Patient presenting with complaint of constipation.  After soapsuds enema in the ED she had large amount of stool out and feels improved.  Her abdominal exam is benign.  Advised patient to take daily MiraLAX as well as her other medications and follow-up with her primary care doctor. Discharged with strict return precautions.  Pt agreeable with plan.  Final Clinical Impressions(s) / ED Diagnoses   Final diagnoses:  Constipation, unspecified constipation type    ED Discharge Orders        Ordered    polyethylene glycol [facility]) packet  Daily     09/06/17 1829       Pixie Casino, MD 09/06/17 [phone]

## 2017-09-06 NOTE — ED Triage Notes (Signed)
Per Pt, Pt is coming from home with complaints of fecal impaction with hx of the same. Last bowel movement was five days ago. Denies vomiting.

## 2017-09-06 NOTE — Discharge Instructions (Signed)
Return to the ED with any concerns including vomiting and not able to keep down liquids or your medications, abdominal pain especially if it localizes to the right lower abdomen, fever or chills, and decreased urine output, decreased level of alertness or lethargy, or any other alarming symptoms.  °

## 2017-09-06 NOTE — ED Notes (Signed)
Pt passed large, soft, brown BM. States she feels 50% better

## 2017-09-06 NOTE — ED Notes (Signed)
Patient verbalizes understanding of discharge instructions. Opportunity for questioning and answers were provided. Armband removed by staff, pt discharged from ED ambulatory with family.  

## 2017-09-06 NOTE — ED Notes (Signed)
Pt stated the last time she was impacted, she was given an enema which did not work, and then was digitally disimpacted before she was relieved.

## 2017-09-20 DIAGNOSIS — N390 Urinary tract infection, site not specified: Secondary | ICD-10-CM | POA: Diagnosis not present

## 2017-09-20 DIAGNOSIS — R300 Dysuria: Secondary | ICD-10-CM | POA: Diagnosis not present

## 2017-09-22 DIAGNOSIS — M545 Low back pain: Secondary | ICD-10-CM | POA: Diagnosis not present

## 2017-09-22 DIAGNOSIS — R350 Frequency of micturition: Secondary | ICD-10-CM | POA: Diagnosis not present

## 2017-09-25 ENCOUNTER — Encounter: Payer: Self-pay | Admitting: Internal Medicine

## 2017-09-25 DIAGNOSIS — F331 Major depressive disorder, recurrent, moderate: Secondary | ICD-10-CM | POA: Diagnosis not present

## 2017-09-28 DIAGNOSIS — E1065 Type 1 diabetes mellitus with hyperglycemia: Secondary | ICD-10-CM | POA: Diagnosis not present

## 2017-10-06 DIAGNOSIS — F331 Major depressive disorder, recurrent, moderate: Secondary | ICD-10-CM | POA: Diagnosis not present

## 2017-10-12 DIAGNOSIS — E1065 Type 1 diabetes mellitus with hyperglycemia: Secondary | ICD-10-CM | POA: Diagnosis not present

## 2017-10-13 DIAGNOSIS — F331 Major depressive disorder, recurrent, moderate: Secondary | ICD-10-CM | POA: Diagnosis not present

## 2017-10-15 DIAGNOSIS — Z794 Long term (current) use of insulin: Secondary | ICD-10-CM | POA: Diagnosis not present

## 2017-10-15 DIAGNOSIS — E1065 Type 1 diabetes mellitus with hyperglycemia: Secondary | ICD-10-CM | POA: Diagnosis not present

## 2017-10-19 DIAGNOSIS — E1065 Type 1 diabetes mellitus with hyperglycemia: Secondary | ICD-10-CM | POA: Diagnosis not present

## 2017-10-19 DIAGNOSIS — Z794 Long term (current) use of insulin: Secondary | ICD-10-CM | POA: Diagnosis not present

## 2017-10-19 DIAGNOSIS — Z886 Allergy status to analgesic agent status: Secondary | ICD-10-CM | POA: Diagnosis not present

## 2017-10-19 DIAGNOSIS — Z9641 Presence of insulin pump (external) (internal): Secondary | ICD-10-CM | POA: Diagnosis not present

## 2017-10-19 DIAGNOSIS — F331 Major depressive disorder, recurrent, moderate: Secondary | ICD-10-CM | POA: Diagnosis not present

## 2017-10-19 DIAGNOSIS — Z885 Allergy status to narcotic agent status: Secondary | ICD-10-CM | POA: Diagnosis not present

## 2017-10-19 DIAGNOSIS — Z888 Allergy status to other drugs, medicaments and biological substances status: Secondary | ICD-10-CM | POA: Diagnosis not present

## 2017-10-20 DIAGNOSIS — M1990 Unspecified osteoarthritis, unspecified site: Secondary | ICD-10-CM | POA: Diagnosis not present

## 2017-10-20 DIAGNOSIS — Z79899 Other long term (current) drug therapy: Secondary | ICD-10-CM | POA: Diagnosis not present

## 2017-10-20 DIAGNOSIS — M797 Fibromyalgia: Secondary | ICD-10-CM | POA: Diagnosis not present

## 2017-10-26 DIAGNOSIS — D550 Anemia due to glucose-6-phosphate dehydrogenase [G6PD] deficiency: Secondary | ICD-10-CM | POA: Diagnosis not present

## 2017-10-26 DIAGNOSIS — R768 Other specified abnormal immunological findings in serum: Secondary | ICD-10-CM | POA: Diagnosis not present

## 2017-10-26 DIAGNOSIS — D75A Glucose-6-phosphate dehydrogenase (G6PD) deficiency without anemia: Secondary | ICD-10-CM

## 2017-10-26 DIAGNOSIS — N750 Cyst of Bartholin's gland: Secondary | ICD-10-CM | POA: Diagnosis not present

## 2017-10-26 HISTORY — DX: Glucose-6-phosphate dehydrogenase (G6PD) deficiency without anemia: D75.A

## 2017-11-03 DIAGNOSIS — N949 Unspecified condition associated with female genital organs and menstrual cycle: Secondary | ICD-10-CM | POA: Diagnosis not present

## 2017-11-03 DIAGNOSIS — K611 Rectal abscess: Secondary | ICD-10-CM | POA: Diagnosis not present

## 2017-11-04 ENCOUNTER — Other Ambulatory Visit: Payer: Self-pay

## 2017-11-04 ENCOUNTER — Emergency Department (HOSPITAL_COMMUNITY)
Admission: EM | Admit: 2017-11-04 | Discharge: 2017-11-04 | Disposition: A | Discharge status: Home / Self Care | Payer: Medicare Other | Attending: Emergency Medicine | Admitting: Emergency Medicine

## 2017-11-04 ENCOUNTER — Ambulatory Visit
Admission: RE | Admit: 2017-11-04 | Discharge: 2017-11-04 | Disposition: A | Discharge status: Home / Self Care | Payer: Medicare Other | Source: Ambulatory Visit | Attending: Student | Admitting: Student

## 2017-11-04 ENCOUNTER — Other Ambulatory Visit: Payer: Self-pay | Admitting: Student

## 2017-11-04 ENCOUNTER — Encounter (HOSPITAL_COMMUNITY): Payer: Self-pay

## 2017-11-04 DIAGNOSIS — Z79899 Other long term (current) drug therapy: Secondary | ICD-10-CM | POA: Insufficient documentation

## 2017-11-04 DIAGNOSIS — R1909 Other intra-abdominal and pelvic swelling, mass and lump: Secondary | ICD-10-CM | POA: Diagnosis not present

## 2017-11-04 DIAGNOSIS — E119 Type 2 diabetes mellitus without complications: Secondary | ICD-10-CM | POA: Insufficient documentation

## 2017-11-04 DIAGNOSIS — R102 Pelvic and perineal pain: Secondary | ICD-10-CM

## 2017-11-04 DIAGNOSIS — K5900 Constipation, unspecified: Secondary | ICD-10-CM | POA: Diagnosis not present

## 2017-11-04 DIAGNOSIS — N949 Unspecified condition associated with female genital organs and menstrual cycle: Secondary | ICD-10-CM

## 2017-11-04 DIAGNOSIS — N898 Other specified noninflammatory disorders of vagina: Secondary | ICD-10-CM | POA: Diagnosis not present

## 2017-11-04 DIAGNOSIS — Z794 Long term (current) use of insulin: Secondary | ICD-10-CM | POA: Insufficient documentation

## 2017-11-04 MED ORDER — IOPAMIDOL (ISOVUE-300) INJECTION 61%
100.0000 mL | Freq: Once | INTRAVENOUS | Status: AC | PRN
  Administered 2017-11-04: 100 mL via INTRAVENOUS

## 2017-11-04 NOTE — ED Provider Notes (Signed)
[facility]GENC[facility] DEPARTMENT Provider Note   CSN: [id number] Arrival date & time: 11/04/17  0920     History   Chief Complaint Chief Complaint  Patient presents with  . Abscess    HPI Tiffany [name] is a 47 y.o. female with history of type 1 diabetes, inflammatory arthritis, fibromyalgia is here for evaluation of painful nodule to the left aspect of the vagina.States that she was evaluated by general surgery yesterday and bedside incision and drainage was attempted but unsuccessful, they recommended she obtained lab work and CT scan as outpatient to determine etiology of the nodule. States earlier today she went to get her labs drawn.  Is here for her CT scan. States"I want to get everything done in one place", states she is a difficult stick and does not want multiple IV started on her, and would like to get it done in ED. States she has an appointment in radiology are ready to get a CT scan done.  No fevers, chills, pain with defecation, drainage.  HPI  Past Medical History:  Diagnosis Date  . Arthritis    knees, lower back right side  . Connective tissue disease ([place])   . Depression   . Diabetes mellitus   . Gastroparesis   . Headache(784.0)   . Heart murmur    as a child - no problem as adult  . History of kidney stones    no surgery  . Hyperlipidemia   . Lupus ([place])     There are no active problems to display for this patient.   Past Surgical History:  Procedure Laterality Date  . [place], 2006   x 2  . COLONOSCOPY WITH PROPOFOL N/A 03/16/2013   Procedure: COLONOSCOPY WITH PROPOFOL;  Surgeon: Arta Silence, MD;  Location: [facility];  Service: Endoscopy;  Laterality: N/A;  . HYSTEROSCOPY W/D&C N/A 07/11/2013   Procedure: DILATATION AND CURETTAGE /HYSTEROSCOPY;  Surgeon: Marvene Staff, MD;  Location: [facility];  Service: Gynecology;  Laterality: N/A;  . ROBOTIC ASSISTED LAPAROSCOPIC LYSIS OF ADHESION N/A 07/11/2013   Procedure: ROBOTIC ASSISTED LAPAROSCOPIC LYSIS OF ADHESIONS ; REPAIR OF UTERINE DEHISENCE;  Surgeon: Marvene Staff, MD;  Location: [facility];  Service: Gynecology;  Laterality: N/A;  . TUBAL LIGATION       OB History   None      Home Medications    Prior to Admission medications   Medication Sig Start Date End Date Taking? Authorizing Provider  atorvastatin (LIPITOR) 40 MG tablet Take 40 mg by mouth every morning.     [provider]  bisacodyl (STIMULANT LAXATIVE) 5 MG EC tablet Take 5 mg by mouth 3 (three) times daily as needed (for constipation).     [provider]  cephALEXin (KEFLEX) 500 MG capsule Take 1 capsule (500 mg total) by mouth 4 (four) times daily. Patient not taking: Reported on 09/04/2017 05/06/16   Fredia Sorrow, MD  citalopram (CELEXA) 20 MG tablet TK 1 TABLET BY MOUTH DAILY 07/20/17   [provider]  clobetasol ointment (TEMOVATE) 5.46 % Apply 1 application topically 2 (two) times daily as needed. Skin irritations 03/15/15   [provider]  cyclobenzaprine (FLEXERIL) 10 MG tablet Take 10 mg by mouth 3 (three) times daily as needed. Muscle spasms 12/01/15   [provider]  erythromycin (E-MYCIN) 250 MG tablet Take 250 mg by mouth 4 (four) times daily - after meals and at bedtime. 01/02/16   [provider]  glucagon (GLUCAGON EMERGENCY) 1 MG injection Inject 1 mg into the skin once as needed. For low blood sugar 12/13/15   [provider]  HYDROcodone-acetaminophen (NORCO/VICODIN) 5-325 MG tablet Take 1-2 tablets by mouth every 6 (six) hours as needed for moderate pain. Patient not taking: Reported on 09/04/2017 05/06/16   Fredia Sorrow, MD  hydrocortisone (ANUSOL-HC) 25 MG suppository Place 25 mg rectally 2 (two) times daily as needed. hemorrhoids 10/02/15   [provider]  hydrocortisone (PROCTOSOL HC) 2.5 % rectal cream Place 1 application rectally daily as needed. hemorrhoids 12/11/14    [provider]  Insulin Glargine (TOUJEO SOLOSTAR) 300 UNIT/ML SOPN Inject 30 Units into the skin every morning.    [provider]  Insulin Glulisine (APIDRA SOLOSTAR) 100 UNIT/ML Solostar Pen Inject 0-20 Units into the skin 3 (three) times daily.    [provider]  linaclotide (LINZESS) 290 MCG CAPS capsule Take 580 mg by mouth daily.     [provider]  metoCLOPramide (REGLAN) 10 MG tablet Take 10 mg by mouth daily as needed. n/v 05/17/15   [provider]  Multiple Vitamin (MULTI-VITAMINS) TABS Take 1 tablet by mouth daily.    [provider]  naproxen (NAPROSYN) 500 MG tablet Take 1 tablet (500 mg total) by mouth 2 (two) times daily with a meal. Patient not taking: Reported on 09/04/2017 01/26/16   Gareth Morgan, MD  ondansetron (ZOFRAN ODT) 4 MG disintegrating tablet Take 1 tablet (4 mg total) by mouth every 8 (eight) hours as needed for nausea or vomiting. Patient not taking: Reported on 09/04/2017 05/06/16   Fredia Sorrow, MD  polyethylene glycol [facility]) packet Take 17 g by mouth daily. 09/06/17   Mabe, Forbes Cellar, MD  promethazine (PHENERGAN) 25 MG tablet Take 1 tablet (25 mg total) by mouth every 6 (six) hours as needed for nausea or vomiting. Patient not taking: Reported on 09/04/2017 05/06/16   Fredia Sorrow, MD  sennosides-docusate sodium (SENOKOT-S) 8.6-50 MG tablet Take 1 tablet by mouth 2 (two) times daily as needed for constipation.    [provider]  Sod Picosulfate-Mag Ox-Cit Acd ([place]) 10-3.5-12 MG-GM-GM PACK Take 1 packet by mouth daily as needed. constipation 05/14/15   [provider]  SUMAtriptan (IMITREX) 25 MG tablet Take 25 mg by mouth every 2 (two) hours as needed for migraine. May repeat in 2 hours if headache persists or recurs.    [provider]  zolpidem (AMBIEN CR) 12.5 MG CR tablet Take 1 tablet (12.5 mg total) by mouth at bedtime as needed for sleep. 07/11/13   Servando Salina, MD    Family History No family history on file.  Social History Social History   Tobacco Use  . Smoking status: Never Smoker  . Smokeless tobacco: Never Used  Substance Use Topics  . Alcohol use: No  . Drug use: No     Allergies   Gabapentin; Morphine and related; and Tramadol   Review of Systems Review of Systems  Skin:       Nodule to left vagina  All other systems reviewed and are negative.    Physical Exam Updated Vital Signs BP 131/80 (BP Location: Right Arm)   Pulse 97   Temp 98 F (36.7 C) (Oral)   Resp 18   Ht 5\' 7"  (1.702 m)   Wt 65.8 kg (145 lb)   LMP 10/18/2017   SpO2 100%   BMI 22.71 kg/m   Physical Exam  Constitutional: She is oriented to  person, place, and time. She appears well-developed and well-nourished.  Non-toxic appearance.  HENT:  Head: Normocephalic.  Right Ear: External ear normal.  Left Ear: External ear normal.  Nose: Nose normal.  Eyes: Conjunctivae and EOM are normal.  Neck: Full passive range of motion without pain.  Cardiovascular: Normal rate.  Pulmonary/Chest: Effort normal. No tachypnea. No respiratory distress.  Genitourinary:  Genitourinary Comments: There is a dime sized tender, indurated freely mobile nodule to left labia majora near introitus, skin colored w//o erythema, warmth.  Otherwise, external genitalia normal without erythema, edema, tenderness, discharge or lesions. No groin lymphadenopathy. Speculum and bimanual exam deferred.   Musculoskeletal: Normal range of motion.  Neurological: She is alert and oriented to person, place, and time.  Skin: Skin is warm and dry. Capillary refill takes less than 2 seconds.  Psychiatric: Her behavior is normal. Thought content normal.     ED Treatments / Results  Labs (all labs ordered are listed, but only abnormal results are displayed) Labs Reviewed - No data to display  EKG None  Radiology No results found.  Procedures Procedures (including  critical care time)  Medications Ordered in ED Medications - No data to display   Initial Impression / Assessment and Plan / ED Course  I have reviewed the triage vital signs and the nursing notes.  Pertinent labs & imaging results that were available during my care of the patient were reviewed by me and considered in my medical decision making (see chart for details).    I have recommended patient go to radiology to obtain her CT scan, explained that we are unable to perform nonemergent imaging in the emergency department. Her workup has been initiated as an outpatient by general surgery. She reports no systemic symptoms of infection, incision and drainage attempted yesterday by general surgery unsuccessful. On exam, she has a tender freely mobile nodule without significant fluctuance. I don't think incision and drainage is indicated again today. She has the order for CT scan to be obtained today. Has obtained lab work as well. She is deemed appropriate for discharge, states she will go to radiology to obtain her CT scan. Discussed return precautions. Although frustrated, she verbalized understanding of ED procedures, imaging for emergent causes.  Final Clinical Impressions(s) / ED Diagnoses   Final diagnoses:  Nodule of groin    ED Discharge Orders    None       Kinnie Feil, PA-C 11/04/17 1004    Sherwood Gambler, MD 11/04/17 [phone]

## 2017-11-04 NOTE — Discharge Instructions (Addendum)
Please go to radiology to get CT scan done today. This will help surgery team determine what type of procedure needs to be done to address the painful nodule.    Return for fevers, worsening swelling, redness, drainage, pain with bowel movement.

## 2017-11-04 NOTE — ED Triage Notes (Signed)
Pt reports she was seen yesterday for abscess by PCP, was sent to have it drained, they were unable to drain it because it was too deep. Pt reports [facility] wanted her to have a CT scan and then have it surgically cut open.

## 2017-11-04 NOTE — ED Notes (Signed)
PA at bedside speaking with pt in triage

## 2017-11-09 DIAGNOSIS — Z4681 Encounter for fitting and adjustment of insulin pump: Secondary | ICD-10-CM | POA: Diagnosis not present

## 2017-11-09 DIAGNOSIS — E1065 Type 1 diabetes mellitus with hyperglycemia: Secondary | ICD-10-CM | POA: Diagnosis not present

## 2017-11-10 ENCOUNTER — Ambulatory Visit (INDEPENDENT_AMBULATORY_CARE_PROVIDER_SITE_OTHER): Payer: BLUE CROSS/BLUE SHIELD | Admitting: Internal Medicine

## 2017-11-10 ENCOUNTER — Encounter: Payer: Self-pay | Admitting: Internal Medicine

## 2017-11-10 VITALS — BP 130/82 | HR 78 | Ht 67.0 in | Wt 147.1 lb

## 2017-11-10 DIAGNOSIS — K5909 Other constipation: Secondary | ICD-10-CM

## 2017-11-10 DIAGNOSIS — K6289 Other specified diseases of anus and rectum: Secondary | ICD-10-CM | POA: Diagnosis not present

## 2017-11-10 DIAGNOSIS — K3184 Gastroparesis: Secondary | ICD-10-CM

## 2017-11-10 DIAGNOSIS — E1143 Type 2 diabetes mellitus with diabetic autonomic (poly)neuropathy: Secondary | ICD-10-CM

## 2017-11-10 DIAGNOSIS — M6289 Other specified disorders of muscle: Secondary | ICD-10-CM | POA: Diagnosis not present

## 2017-11-10 NOTE — Progress Notes (Signed)
HISTORY OF PRESENT ILLNESS:  Tiffany [name] is a 47 y.o. female , self-referred (friend of Tiffany [name], my patient), with long-standing type I insulin requiring diabetes mellitus, unspecified connective tissue disease, depression, arthritis, and hyperlipidemia. She presents herself today with chief complaint of severe chronic constipation and more recent problems with lump-like sensation and pain in her rectum. The patient has been seen by no less than 4 different GI practices. 2 here in [place] (Dr. Paulita Fujita with [facility] and Dr. Collene Mares with her group) and to tertiary care centers ([place]). I have reviewed multiple records and summaries which took a considerable amount of time. In any event, she had unremarkable colonoscopy with Dr. Paulita Fujita 03/16/2013. Hemorrhoids noted. She has been on myriad agents for her constipation with limited success. Most recently on linzess 290 g daily. She has undergone anorectal manometry and was diagnosed with pelvic floor dysfunction. Apparently underwent some level of biofeedback and physical therapy without success. At [facility] it was suggested to her that she may benefit from colectomy with ileostomy. She is upset and frustrated. She has been on prednisone for her arthritis. Diabetic control has been poor. Last hemoglobin A1c was 10. 12 days ago she developed a lump-like sensation in the rectum. Has not had before. Saw her gynecologist. Kristeen Miss it was not gynecologic. Saw general surgery. Apparently had an unsuccessful aspiration procedure. Records have been requested. Still with discomfort. To evaluate this she did undergo contrast-enhanced CT scan of the abdomen and pelvis last week. This did not reveal any abnormalities in the area of concern. Apparently has general surgical follow-up scheduled. No fevers. Not taking narcotic so it is on her medication list. Currently having 1 bowel movement per week  REVIEW OF SYSTEMS:  All non-GI ROS negative unless  otherwise stated in the history of present illness except for arthritis, back pain, depression, headaches, heart murmur, night sweats, skin rash  Past Medical History:  Diagnosis Date  . Arthritis    knees, lower back right side  . Connective tissue disease ([place])   . Depression   . Diabetes mellitus   . Gastroparesis   . Headache(784.0)   . Heart murmur    as a child - no problem as adult  . History of kidney stones    no surgery  . Hyperlipidemia   . Lupus [facility])     Past Surgical History:  Procedure Laterality Date  . [place], 2006   x 2  . COLONOSCOPY WITH PROPOFOL N/A 03/16/2013   Procedure: COLONOSCOPY WITH PROPOFOL;  Surgeon: Arta Silence, MD;  Location: [facility];  Service: Endoscopy;  Laterality: N/A;  . HYSTEROSCOPY W/D&C N/A 07/11/2013   Procedure: DILATATION AND CURETTAGE /HYSTEROSCOPY;  Surgeon: Marvene Staff, MD;  Location: [facility];  Service: Gynecology;  Laterality: N/A;  . ROBOTIC ASSISTED LAPAROSCOPIC LYSIS OF ADHESION N/A 07/11/2013   Procedure: ROBOTIC ASSISTED LAPAROSCOPIC LYSIS OF ADHESIONS ; REPAIR OF UTERINE DEHISENCE;  Surgeon: Marvene Staff, MD;  Location: [facility];  Service: Gynecology;  Laterality: N/A;  . TUBAL LIGATION      Social History Tiffany L Gregg  reports that she has never smoked. She has never used smokeless tobacco. She reports that she does not drink alcohol or use drugs.  family history includes Diabetes in her sister.  Allergies  Allergen Reactions  . Gabapentin     Leg Pain  . Morphine And Related Hives and Itching  . Tramadol Hives and Itching  PHYSICAL EXAMINATION: Vital signs: BP 130/82   Pulse 78   Ht _0  (1.702 m)   Wt 147 lb 2 oz (66.7 kg)   LMP 10/18/2017   BMI 23.04 kg/m   Constitutional: generally well-appearing, no acute distress Psychiatric: alert and oriented x3, cooperative Eyes: extraocular movements intact, anicteric, conjunctiva pink Mouth: oral pharynx moist, no  lesions Neck: supple no lymphadenopathy Cardiovascular: heart regular rate and rhythm, no murmur Lungs: clear to auscultation bilaterally Abdomen: soft, nontender, nondistended, no obvious ascites, no peritoneal signs, normal bowel sounds, no organomegaly . Insulin pump in place Rectal:no external abnormalities. Marked tenderness with palpation anteriorly at the anal verge. However, no mass or other abnormality felt. No stool in the rectal vault Extremities: no 11, cyanosis, or lower extremity edema bilaterally Skin: no lesions on visible extremities Neuro: No focal deficits. Cranial nerves intact  ASSESSMENT:  #1. Chronic constipation secondary to pelvic floor dysfunction #2. Anterior rectal pain of uncertain etiology they monitored by surgery #3. History of diabetic gastroparesis #4. Multiple medical problems   PLAN:  1. I discussed with her pelvic floor dysfunction. I have no additional medical therapies to offer for this diagnosis. I did suggest that she see the motility experts in this area at [facility] such as Dr. Larena Glassman and Dr. Gunnar Bulla. She may benefit from more aggressive biofeedback and pelvic floor physical therapy. She is interested in a referral regarding in a spurt opinion. In the interim she will continue with her current measures to try to assist with her bowel movements. 2. Follow up with general surgery regarding rectal pain 3. Resume general medical care with her PCP. Good diabetic control important 4. GI follow-up as needed

## 2017-11-10 NOTE — Patient Instructions (Signed)
You will be contacted by someone at the [facility] Motility department to schedule an appointment.  Dr. Henrene Pastor mentioned Dr. Darrick Grinder and Dr. Larena Glassman as possibilities

## 2017-11-16 ENCOUNTER — Telehealth: Payer: Self-pay | Admitting: Internal Medicine

## 2017-11-16 NOTE — Telephone Encounter (Signed)
Magda Paganini I think you were working on this referral.

## 2017-11-16 NOTE — Telephone Encounter (Signed)
Patient calling again stating she is waiting on referral to be sent to [facility]. Pt states fax to that office is [phone].

## 2017-11-17 NOTE — Telephone Encounter (Signed)
Faxed referral form and pertinent information to [facility] motility clinic

## 2017-11-19 DIAGNOSIS — N949 Unspecified condition associated with female genital organs and menstrual cycle: Secondary | ICD-10-CM | POA: Diagnosis not present

## 2017-11-20 DIAGNOSIS — N9089 Other specified noninflammatory disorders of vulva and perineum: Secondary | ICD-10-CM | POA: Diagnosis not present

## 2017-11-25 DIAGNOSIS — D649 Anemia, unspecified: Secondary | ICD-10-CM | POA: Diagnosis not present

## 2017-12-01 DIAGNOSIS — N9089 Other specified noninflammatory disorders of vulva and perineum: Secondary | ICD-10-CM | POA: Diagnosis not present

## 2017-12-02 DIAGNOSIS — M797 Fibromyalgia: Secondary | ICD-10-CM | POA: Diagnosis not present

## 2017-12-02 DIAGNOSIS — G894 Chronic pain syndrome: Secondary | ICD-10-CM | POA: Diagnosis not present

## 2017-12-02 DIAGNOSIS — M1990 Unspecified osteoarthritis, unspecified site: Secondary | ICD-10-CM | POA: Diagnosis not present

## 2017-12-02 HISTORY — DX: Fibromyalgia: M79.7

## 2017-12-15 DIAGNOSIS — M25612 Stiffness of left shoulder, not elsewhere classified: Secondary | ICD-10-CM | POA: Diagnosis not present

## 2017-12-15 DIAGNOSIS — M62838 Other muscle spasm: Secondary | ICD-10-CM | POA: Diagnosis not present

## 2017-12-15 DIAGNOSIS — M542 Cervicalgia: Secondary | ICD-10-CM | POA: Diagnosis not present

## 2017-12-16 DIAGNOSIS — G894 Chronic pain syndrome: Secondary | ICD-10-CM | POA: Diagnosis not present

## 2017-12-16 DIAGNOSIS — M1990 Unspecified osteoarthritis, unspecified site: Secondary | ICD-10-CM | POA: Diagnosis not present

## 2017-12-16 DIAGNOSIS — F331 Major depressive disorder, recurrent, moderate: Secondary | ICD-10-CM | POA: Diagnosis not present

## 2017-12-16 DIAGNOSIS — M797 Fibromyalgia: Secondary | ICD-10-CM | POA: Diagnosis not present

## 2017-12-24 ENCOUNTER — Telehealth: Payer: Self-pay | Admitting: Internal Medicine

## 2017-12-24 NOTE — Telephone Encounter (Signed)
Pt calling regarding referral to [facility] for motility. She said that nobody has contacted from [facility] yet and she is having issues again. She would like a call back.

## 2017-12-24 NOTE — Telephone Encounter (Signed)
Magda Paganini it looks like you made this referral.

## 2017-12-25 NOTE — Telephone Encounter (Signed)
Pt called again to follow up on this referral. Pls call her.

## 2017-12-28 DIAGNOSIS — Z794 Long term (current) use of insulin: Secondary | ICD-10-CM | POA: Diagnosis not present

## 2017-12-28 DIAGNOSIS — E109 Type 1 diabetes mellitus without complications: Secondary | ICD-10-CM | POA: Diagnosis not present

## 2017-12-28 DIAGNOSIS — E1065 Type 1 diabetes mellitus with hyperglycemia: Secondary | ICD-10-CM | POA: Diagnosis not present

## 2017-12-28 NOTE — Telephone Encounter (Signed)
Spoke with the [facility] Motility clinic to follow up on this referral and was told it was declined because of the number of GI doctors patient had already seen in the past.  I requested documentation of this - when received I will speak with Dr. Henrene Pastor about the next step.

## 2017-12-28 NOTE — Telephone Encounter (Signed)
Received a fax from [facility] that said what I was told over the phone - that because the patient had already seen multiple GI physicians, including [facility] and [facility], they could not offer their services at this time.  The only options they suggested were to refer the patient for a specific procedure or refer them to their former colleague, Dr. Benita Stabile, who has a private office in [place].

## 2017-12-29 NOTE — Telephone Encounter (Signed)
The patient is new to me . I want her seen at [facility] regardless of her past history. They have the most expertise in motility disorders. Refusing to see this patient is not reasonable as I need their help.

## 2017-12-29 NOTE — Telephone Encounter (Signed)
Re faxed a referral to the motility clinic including Dr. Blanch Media response and reiterated why it is important that she be seen there.  Spoke to patient and explained what I was doing and that I would keep her posted.  Patient agreed.  I will follow up with the motility clinic to make sure they got this information and see if they will reconsider seeing her.

## 2018-01-04 DIAGNOSIS — M542 Cervicalgia: Secondary | ICD-10-CM | POA: Diagnosis not present

## 2018-01-04 DIAGNOSIS — M25612 Stiffness of left shoulder, not elsewhere classified: Secondary | ICD-10-CM | POA: Diagnosis not present

## 2018-01-04 DIAGNOSIS — M62838 Other muscle spasm: Secondary | ICD-10-CM | POA: Diagnosis not present

## 2018-01-04 DIAGNOSIS — R102 Pelvic and perineal pain: Secondary | ICD-10-CM | POA: Diagnosis not present

## 2018-01-04 DIAGNOSIS — N921 Excessive and frequent menstruation with irregular cycle: Secondary | ICD-10-CM | POA: Diagnosis not present

## 2018-01-08 ENCOUNTER — Encounter (HOSPITAL_COMMUNITY): Payer: Self-pay | Admitting: Emergency Medicine

## 2018-01-08 ENCOUNTER — Other Ambulatory Visit: Payer: Self-pay

## 2018-01-08 ENCOUNTER — Emergency Department (HOSPITAL_COMMUNITY): Payer: BLUE CROSS/BLUE SHIELD

## 2018-01-08 ENCOUNTER — Emergency Department (HOSPITAL_COMMUNITY)
Admission: EM | Admit: 2018-01-08 | Discharge: 2018-01-09 | Disposition: A | Discharge status: Home / Self Care | Payer: BLUE CROSS/BLUE SHIELD | Attending: Emergency Medicine | Admitting: Emergency Medicine

## 2018-01-08 DIAGNOSIS — Z794 Long term (current) use of insulin: Secondary | ICD-10-CM | POA: Insufficient documentation

## 2018-01-08 DIAGNOSIS — E119 Type 2 diabetes mellitus without complications: Secondary | ICD-10-CM | POA: Insufficient documentation

## 2018-01-08 DIAGNOSIS — R1030 Lower abdominal pain, unspecified: Secondary | ICD-10-CM | POA: Diagnosis not present

## 2018-01-08 DIAGNOSIS — K5900 Constipation, unspecified: Secondary | ICD-10-CM | POA: Insufficient documentation

## 2018-01-08 DIAGNOSIS — R42 Dizziness and giddiness: Secondary | ICD-10-CM | POA: Diagnosis not present

## 2018-01-08 DIAGNOSIS — R112 Nausea with vomiting, unspecified: Secondary | ICD-10-CM | POA: Diagnosis not present

## 2018-01-08 DIAGNOSIS — D259 Leiomyoma of uterus, unspecified: Secondary | ICD-10-CM | POA: Diagnosis not present

## 2018-01-08 LAB — URINALYSIS, ROUTINE W REFLEX MICROSCOPIC
Bacteria, UA: NONE SEEN
Bilirubin Urine: NEGATIVE
Glucose, UA: 50 mg/dL — AB
Ketones, ur: NEGATIVE mg/dL
Leukocytes, UA: NEGATIVE
Nitrite: NEGATIVE
Protein, ur: NEGATIVE mg/dL
Specific Gravity, Urine: 1.025 (ref 1.005–1.030)
pH: 5 (ref 5.0–8.0)

## 2018-01-08 LAB — COMPREHENSIVE METABOLIC PANEL
ALT: 11 U/L — ABNORMAL LOW (ref 14–54)
AST: 19 U/L (ref 15–41)
Albumin: 3.7 g/dL (ref 3.5–5.0)
Alkaline Phosphatase: 64 U/L (ref 38–126)
Anion gap: 9 (ref 5–15)
BUN: 14 mg/dL (ref 6–20)
CO2: 26 mmol/L (ref 22–32)
Calcium: 9.1 mg/dL (ref 8.9–10.3)
Chloride: 102 mmol/L (ref 101–111)
Creatinine, Ser: 0.68 mg/dL (ref 0.44–1.00)
GFR calc Af Amer: >60 mL/min (ref >60)
GFR calc non Af Amer: >60 mL/min (ref >60)
Glucose, Bld: 197 mg/dL — ABNORMAL HIGH (ref 65–99)
Potassium: 4 mmol/L (ref 3.5–5.1)
Sodium: 137 mmol/L (ref 135–145)
Total Bilirubin: 0.5 mg/dL (ref 0.3–1.2)
Total Protein: 7.4 g/dL (ref 6.5–8.1)

## 2018-01-08 LAB — CBC
HCT: 39.9 % (ref 36.0–46.0)
Hemoglobin: 12.3 g/dL (ref 12.0–15.0)
MCH: 26.8 pg (ref 26.0–34.0)
MCHC: 30.8 g/dL (ref 30.0–36.0)
MCV: 86.9 fL (ref 78.0–100.0)
Platelets: 400 10*3/uL (ref 150–400)
RBC: 4.59 MIL/uL (ref 3.87–5.11)
RDW: 14.2 % (ref 11.5–15.5)
WBC: 7.1 10*3/uL (ref 4.0–10.5)

## 2018-01-08 LAB — I-STAT BETA HCG BLOOD, ED (MC, WL, AP ONLY): I-stat hCG, quantitative: <5 m[IU]/mL (ref <5)

## 2018-01-08 LAB — LIPASE, BLOOD: Lipase: 20 U/L (ref 11–51)

## 2018-01-08 MED ORDER — ONDANSETRON HCL 4 MG/2ML IJ SOLN
4.0000 mg | Freq: Once | INTRAMUSCULAR | Status: AC
  Administered 2018-01-09: 4 mg via INTRAVENOUS
  Filled 2018-01-08: qty 2

## 2018-01-08 MED ORDER — SODIUM CHLORIDE 0.9 % IV BOLUS
1000.0000 mL | Freq: Once | INTRAVENOUS | Status: AC
  Administered 2018-01-09: 1000 mL via INTRAVENOUS

## 2018-01-08 MED ORDER — IOHEXOL 300 MG/ML  SOLN
100.0000 mL | Freq: Once | INTRAMUSCULAR | Status: AC | PRN
  Administered 2018-01-08: 100 mL via INTRAVENOUS

## 2018-01-08 MED ORDER — DICYCLOMINE HCL 10 MG/ML IM SOLN
20.0000 mg | Freq: Once | INTRAMUSCULAR | Status: AC
  Administered 2018-01-09: 20 mg via INTRAMUSCULAR
  Filled 2018-01-08: qty 2

## 2018-01-08 NOTE — ED Provider Notes (Addendum)
Patient placed in Quick Look pathway, seen and evaluated   Chief Complaint: Abdominal pain  HPI:   Presents with lower abdominal pain beginning this morning.  Pain is sharp and cramping, constant, radiating throughout the abdomen.  Nausea/vomiting beginning yesterday.  Last bowel movement 2 days ago.  Denies abnormal vaginal discharge/bleeding, diarrhea, hematochezia/melena, fever, chest pain.  ROS: Abdominal pain (one)  Physical Exam:   Gen: No distress  Neuro: Awake and Alert  Skin: Warm    Focused Exam:  No diaphoresis.  No pallor.  Pulmonary: No increased work of breathing.  Speaks in full sentences without difficulty.  No tachypnea.  Cardiac: Normal rate and regular. Peripheral pulses intact.  Abdominal: Tenderness appears to be focused in the left lower quadrant, but patient also notes tenderness throughout the abdomen.  Palpation in any other area of the abdomen only causes pain in that location and does not refer pain back to the left lower quadrant.  Bowel sounds intact.  Initiation of care has begun. The patient has been counseled on the process, plan, and necessity for staying for the completion/evaluation, and the remainder of the medical screening examination    Tiffany [name] 01/08/18 [place], [place], PA-C 01/08/18 1724    Lacretia Leigh, MD 01/10/18 [phone]

## 2018-01-08 NOTE — ED Triage Notes (Signed)
Pt presents with abd pain with n/v and dizziness that began 3 days ago; pt reporting all lower abd pain; reports abd pain as sharp and cramping; 5 episodes of vomiting

## 2018-01-09 DIAGNOSIS — R42 Dizziness and giddiness: Secondary | ICD-10-CM | POA: Diagnosis not present

## 2018-01-09 MED ORDER — ONDANSETRON 4 MG PO TBDP: 4.0000 mg | ORAL_TABLET | Freq: Three times a day (TID) | ORAL | 0 refills | Status: DC | PRN

## 2018-01-09 MED ORDER — SOD PICOSULFATE-MAG OX-CIT ACD 10-3.5-12 MG-GM-GM PO PACK: 1.0000 | PACK | Freq: Every day | ORAL | 0 refills | Status: DC

## 2018-01-09 NOTE — Discharge Instructions (Addendum)
Your work-up has been reassuring.  Your CAT scan does show signs of constipation.  Have given you medication as prescribed.  I will also give you nausea medicine called Zofran.  Drink plenty of fluids stay hydrated.  Your dizziness may be secondary to your vomiting or to your medication.  Please follow-up with your primary care doctor or your pain doctor and your GI doctor.  Return the ED with any worsening symptoms.

## 2018-01-09 NOTE — ED Provider Notes (Signed)
[facility] EMERGENCY DEPARTMENT Provider Note   CSN: [id number] Arrival date & time: 01/08/18  1534     History   Chief Complaint Chief Complaint  Patient presents with  . Abdominal Pain  . Emesis  . Dizziness    HPI Tiffany [name] is a 47 y.o. female.  HPI 47 year old African-American female past medical history significant for depression, diabetes, gastroparesis, headaches that presents to the emergency department today for multiple complaints.  Patient states that approximately 3 days ago she started to develop some nausea and dizziness.  Patient states that prior to this she started taking her buprenorphine patch.  States that she has not taken this for the past week and then restarted but it was up from 5 mg to 10 mg.  States the next day she started to develop some dizziness and nausea.  She also had has had intermittent episodes of vomiting.  States the dizziness is positional worse when she goes from a sitting to standing position.  Denies any syncopal episodes or near syncope symptoms.  She states that this morning she started to develop some lower abdominal pain that she describes as cramping and constant radiating throughout the abdomen and to her lower back.  Patient last bowel was 2 days ago.  Her stools are always loose.  She does have a history of constipation.  Patient is followed by GI for this.  Patient denies any melena, hematochezia, urinary symptoms, vaginal symptoms, fevers, chills, chest pain, shortness of breath, syncope, vision changes.  Patient has not taken anything for her symptoms prior to arrival.  Patient is on Linzess but has not taken this for the past 3 days.  Pt denies any fever, chill, ha, vision changes, lightheadedness, congestion, neck pain, cp, sob, cough, urinary symptoms, melena, hematochezia, lower extremity paresthesias.  Past Medical History:  Diagnosis Date  . Arthritis    knees, lower back right side  . Connective tissue  disease ([place])   . Depression   . Diabetes mellitus   . Gastroparesis   . Headache(784.0)   . Heart murmur    as a child - no problem as adult  . History of kidney stones    no surgery  . Hyperlipidemia   . Lupus ([place])     There are no active problems to display for this patient.   Past Surgical History:  Procedure Laterality Date  . [place], 2006   x 2  . COLONOSCOPY WITH PROPOFOL N/A 03/16/2013   Procedure: COLONOSCOPY WITH PROPOFOL;  Surgeon: Arta Silence, MD;  Location: [facility];  Service: Endoscopy;  Laterality: N/A;  . HYSTEROSCOPY W/D&C N/A 07/11/2013   Procedure: DILATATION AND CURETTAGE /HYSTEROSCOPY;  Surgeon: Marvene Staff, MD;  Location: [facility];  Service: Gynecology;  Laterality: N/A;  . ROBOTIC ASSISTED LAPAROSCOPIC LYSIS OF ADHESION N/A 07/11/2013   Procedure: ROBOTIC ASSISTED LAPAROSCOPIC LYSIS OF ADHESIONS ; REPAIR OF UTERINE DEHISENCE;  Surgeon: Marvene Staff, MD;  Location: [facility];  Service: Gynecology;  Laterality: N/A;  . TUBAL LIGATION       OB History   None      Home Medications    Prior to Admission medications   Medication Sig Start Date End Date Taking? Authorizing Provider  atorvastatin (LIPITOR) 40 MG tablet Take 40 mg by mouth every morning.    Yes [provider]  buprenorphine (BUTRANS - DOSED MCG/HR) 10 MCG/HR PTWK patch Place 1 patch onto the skin once a week. 12/31/17  Yes [provider]  cyclobenzaprine (FLEXERIL) 10 MG tablet Take 10 mg by mouth 3 (three) times daily as needed. Muscle spasms 12/01/15  Yes [provider]  glucagon (GLUCAGON EMERGENCY) 1 MG injection Inject 1 mg into the skin once as needed. For low blood sugar 12/13/15  Yes [provider]  Insulin Human (INSULIN PUMP) SOLN Inject into the skin continuous. Insulin glulisine (Apidra) 100 Units/mL   Yes [provider]  linaclotide (LINZESS) 290 MCG CAPS capsule Take 290 mcg by mouth daily.    Yes  [provider]  Multiple Vitamin (MULTI-VITAMINS) TABS Take 1 tablet by mouth daily.   Yes [provider]  predniSONE (DELTASONE) 5 MG tablet Take 5 mg by mouth daily. 12/06/17  Yes [provider]  zolpidem (AMBIEN CR) 12.5 MG CR tablet Take 1 tablet (12.5 mg total) by mouth at bedtime as needed for sleep. 07/11/13  Yes Servando Salina, MD  cephALEXin (KEFLEX) 500 MG capsule Take 1 capsule (500 mg total) by mouth 4 (four) times daily. Patient not taking: Reported on 01/08/2018 05/06/16   Fredia Sorrow, MD  clindamycin (CLEOCIN) 300 MG capsule Take 300 mg by mouth 2 (two) times daily. 01/05/18 01/12/18  [provider]  HYDROcodone-acetaminophen (NORCO/VICODIN) 5-325 MG tablet Take 1-2 tablets by mouth every 6 (six) hours as needed for moderate pain. Patient not taking: Reported on 01/08/2018 05/06/16   Fredia Sorrow, MD  naproxen (NAPROSYN) 500 MG tablet Take 1 tablet (500 mg total) by mouth 2 (two) times daily with a meal. Patient not taking: Reported on 01/08/2018 01/26/16   Gareth Morgan, MD  ondansetron (ZOFRAN ODT) 4 MG disintegrating tablet Take 1 tablet (4 mg total) by mouth every 8 (eight) hours as needed for nausea or vomiting. 01/09/18   Jax Abdelrahman, Zack Seal, PA-C  promethazine (PHENERGAN) 25 MG tablet Take 1 tablet (25 mg total) by mouth every 6 (six) hours as needed for nausea or vomiting. Patient not taking: Reported on 01/08/2018 05/06/16   Fredia Sorrow, MD  Sod Picosulfate-Mag Ox-Cit Acd 10-3.5-12 MG-GM-GM PACK Take 1 packet by mouth daily. 01/09/18   Doristine Devoid, PA-C    Family History Family History  Problem Relation Age of Onset  . Diabetes Sister     Social History Social History   Tobacco Use  . Smoking status: Never Smoker  . Smokeless tobacco: Never Used  Substance Use Topics  . Alcohol use: No  . Drug use: No     Allergies   Gabapentin; Morphine and related; and Tramadol   Review of Systems Review of  Systems  All other systems reviewed and are negative.    Physical Exam Updated Vital Signs BP 138/83   Pulse 87   Temp 98.3 F (36.8 C) (Oral)   Resp 16   Ht 5\' 7"  (1.702 m)   Wt 66.7 kg (147 lb)   LMP 12/30/2017   SpO2 100%   BMI 23.02 kg/m   Physical Exam  Constitutional: She is oriented to person, place, and time. She appears well-developed and well-nourished.  Non-toxic appearance. No distress.  HENT:  Head: Normocephalic and atraumatic.  Nose: Nose normal.  Mouth/Throat: Oropharynx is clear and moist.  Eyes: Pupils are equal, round, and reactive to light. Conjunctivae and EOM are normal. Right eye exhibits no discharge. Left eye exhibits no discharge.  Neck: Normal range of motion. Neck supple.  Cardiovascular: Normal rate, regular rhythm, normal heart sounds and intact distal pulses. Exam reveals no gallop and no friction  rub.  No murmur heard. Pulmonary/Chest: Effort normal and breath sounds normal. No stridor. No respiratory distress. She has no wheezes. She has no rales. She exhibits no tenderness.  Abdominal: Soft. Bowel sounds are normal. There is generalized tenderness. There is no rigidity, no rebound, no guarding, no CVA tenderness, no tenderness at McBurney's point and negative Murphy's sign.  Musculoskeletal: Normal range of motion. She exhibits no tenderness.  Lymphadenopathy:    She has no cervical adenopathy.  Neurological: She is alert and oriented to person, place, and time.  The patient is alert, attentive, and oriented x 3. Speech is clear. Cranial nerve II-VII grossly intact. Negative pronator drift. Sensation intact. Strength 5/5 in all extremities. Reflexes 2+ and symmetric at biceps, triceps, knees, and ankles. Rapid alternating movement and fine finger movements intact. Romberg is absent. Posture and gait normal.   Skin: Skin is warm and dry. Capillary refill takes less than 2 seconds.  Psychiatric: Her behavior is normal. Judgment and thought  content normal.  Nursing note and vitals reviewed.    ED Treatments / Results  Labs (all labs ordered are listed, but only abnormal results are displayed) Labs Reviewed  COMPREHENSIVE METABOLIC PANEL - Abnormal; Notable for the following components:      Result Value   Glucose, Bld 197 (*)    ALT 11 (*)    All other components within normal limits  URINALYSIS, ROUTINE W REFLEX MICROSCOPIC - Abnormal; Notable for the following components:   APPearance HAZY (*)    Glucose, UA 50 (*)    Hgb urine dipstick SMALL (*)    All other components within normal limits  LIPASE, BLOOD  CBC  I-STAT BETA HCG BLOOD, ED ([facility], [facility], [facility] ONLY)    EKG None  Radiology US Transvaginal Non-ob  Result Date: 01/08/2018 CLINICAL DATA:  Left lower quadrant pain. EXAM: TRANSABDOMINAL AND TRANSVAGINAL ULTRASOUND OF PELVIS DOPPLER ULTRASOUND OF OVARIES TECHNIQUE: Both transabdominal and transvaginal ultrasound examinations of the pelvis were performed. Transabdominal technique was performed for global imaging of the pelvis including uterus, ovaries, adnexal regions, and pelvic cul-de-sac. It was necessary to proceed with endovaginal exam following the transabdominal exam to visualize the endometrium and ovaries. Color and duplex Doppler ultrasound was utilized to evaluate blood flow to the ovaries. COMPARISON:  None. FINDINGS: Uterus Measurements: 8.2 x 4.7 x 5.8 cm. Retroverted and heterogeneous. Contains 2 fibroids. A posterior fibroid measures 2.2 x 2.0 x 2.2 cm and an anterior fibroid measures 1.5 x 1.3 x 1.4 cm. Endometrium Thickness: 5 mm.  No focal abnormality visualized. Right ovary Measurements: 2.8 x 1.2 x 2.2 cm. Normal appearance/no adnexal mass. Left ovary Measurements: 2.5 x 1.2 x 1.7 cm. Normal appearance/no adnexal mass. Pulsed Doppler evaluation of both ovaries demonstrates normal low-resistance arterial and venous waveforms. Other findings Trace fluid in the cul-de-sac is likely physiologic. IMPRESSION:  1. No acute abnormalities to explain the patient's pain. 2. Two fibroids in the uterus. 3. Minimal physiologic fluid in the pelvis. Electronically Signed   By: Dorise Bullion III M.D   On: 01/08/2018 18:27   US Pelvis Complete  Result Date: 01/08/2018 CLINICAL DATA:  Left lower quadrant pain. EXAM: TRANSABDOMINAL AND TRANSVAGINAL ULTRASOUND OF PELVIS DOPPLER ULTRASOUND OF OVARIES TECHNIQUE: Both transabdominal and transvaginal ultrasound examinations of the pelvis were performed. Transabdominal technique was performed for global imaging of the pelvis including uterus, ovaries, adnexal regions, and pelvic cul-de-sac. It was necessary to proceed with endovaginal exam following the transabdominal exam to visualize the endometrium and  ovaries. Color and duplex Doppler ultrasound was utilized to evaluate blood flow to the ovaries. COMPARISON:  None. FINDINGS: Uterus Measurements: 8.2 x 4.7 x 5.8 cm. Retroverted and heterogeneous. Contains 2 fibroids. A posterior fibroid measures 2.2 x 2.0 x 2.2 cm and an anterior fibroid measures 1.5 x 1.3 x 1.4 cm. Endometrium Thickness: 5 mm.  No focal abnormality visualized. Right ovary Measurements: 2.8 x 1.2 x 2.2 cm. Normal appearance/no adnexal mass. Left ovary Measurements: 2.5 x 1.2 x 1.7 cm. Normal appearance/no adnexal mass. Pulsed Doppler evaluation of both ovaries demonstrates normal low-resistance arterial and venous waveforms. Other findings Trace fluid in the cul-de-sac is likely physiologic. IMPRESSION: 1. No acute abnormalities to explain the patient's pain. 2. Two fibroids in the uterus. 3. Minimal physiologic fluid in the pelvis. Electronically Signed   By: Dorise Bullion III M.D   On: 01/08/2018 18:27   Ct Abdomen Pelvis W Contrast  Result Date: 01/08/2018 CLINICAL DATA:  Abdominal pain with nausea vomiting and dizziness EXAM: CT ABDOMEN AND PELVIS WITH CONTRAST TECHNIQUE: Multidetector CT imaging of the abdomen and pelvis was performed using the standard  protocol following bolus administration of intravenous contrast. CONTRAST:  112mL OMNIPAQUE IOHEXOL 300 MG/ML  SOLN COMPARISON:  Pelvic CT 11/04/2017, CT abdomen pelvis 05/06/2016 FINDINGS: Lower chest: No acute abnormality. Hepatobiliary: No focal liver abnormality is seen. No gallstones, gallbladder wall thickening, or biliary dilatation. Pancreas: Unremarkable. No pancreatic ductal dilatation or surrounding inflammatory changes. Spleen: Normal in size without focal abnormality. Adrenals/Urinary Tract: Adrenal glands are unremarkable. Kidneys are normal, without renal calculi, focal lesion, or hydronephrosis. Bladder is unremarkable. Stomach/Bowel: Stomach is nonenlarged. No dilated small bowel. Large volume of stool in the colon. Generous appendix up to 8 mm but no inflammatory changes Vascular/Lymphatic: Mild aortic atherosclerosis. No aneurysmal dilatation. No significantly enlarged lymph nodes Reproductive: Exophytic enhancing uterine mass consistent with fibroid. No adnexal mass Other: Mild anterior abdominal wall skin thickening. No free air. Trace free fluid in the pelvis Musculoskeletal: No acute or significant osseous findings. IMPRESSION: 1. No CT evidence for acute intra-abdominal or pelvic abnormality. Large volume of stool in the colon, possible constipation 2. Generous appendix up to 8 mm but no additional findings for inflammatory change or appendicitis 3. Fibroid uterus 4. Trace free fluid in the pelvis Electronically Signed   By: Donavan Foil M.D.   On: 01/08/2018 19:58   [place] Art/ven Flow Abd Pelv Doppler  Result Date: 01/08/2018 CLINICAL DATA:  Left lower quadrant pain. EXAM: TRANSABDOMINAL AND TRANSVAGINAL ULTRASOUND OF PELVIS DOPPLER ULTRASOUND OF OVARIES TECHNIQUE: Both transabdominal and transvaginal ultrasound examinations of the pelvis were performed. Transabdominal technique was performed for global imaging of the pelvis including uterus, ovaries, adnexal regions, and pelvic  cul-de-sac. It was necessary to proceed with endovaginal exam following the transabdominal exam to visualize the endometrium and ovaries. Color and duplex Doppler ultrasound was utilized to evaluate blood flow to the ovaries. COMPARISON:  None. FINDINGS: Uterus Measurements: 8.2 x 4.7 x 5.8 cm. Retroverted and heterogeneous. Contains 2 fibroids. A posterior fibroid measures 2.2 x 2.0 x 2.2 cm and an anterior fibroid measures 1.5 x 1.3 x 1.4 cm. Endometrium Thickness: 5 mm.  No focal abnormality visualized. Right ovary Measurements: 2.8 x 1.2 x 2.2 cm. Normal appearance/no adnexal mass. Left ovary Measurements: 2.5 x 1.2 x 1.7 cm. Normal appearance/no adnexal mass. Pulsed Doppler evaluation of both ovaries demonstrates normal low-resistance arterial and venous waveforms. Other findings Trace fluid in the cul-de-sac is likely physiologic. IMPRESSION: 1. No acute  abnormalities to explain the patient's pain. 2. Two fibroids in the uterus. 3. Minimal physiologic fluid in the pelvis. Electronically Signed   By: Dorise Bullion III M.D   On: 01/08/2018 18:27    Procedures Procedures (including critical care time)  Medications Ordered in ED Medications  iohexol (OMNIPAQUE) 300 MG/ML solution 100 mL (100 mLs Intravenous Contrast Given 01/08/18 1920)  sodium chloride 0.9 % bolus 1,000 mL (0 mLs Intravenous Stopped 01/09/18 0211)  ondansetron (ZOFRAN) injection 4 mg (4 mg Intravenous Given 01/09/18 0047)  dicyclomine (BENTYL) injection 20 mg (20 mg Intramuscular Given 01/09/18 0047)     Initial Impression / Assessment and Plan / ED Course  I have reviewed the triage vital signs and the nursing notes.  Pertinent labs & imaging results that were available during my care of the patient were reviewed by me and considered in my medical decision making (see chart for details).     Patient presents to the emergency department today for evaluation of dizziness, nausea and generalized abdominal pain.  Patient reports  history of constipation is followed by GI for this due to her gastroparesis.  The patient states that her symptoms started a day after increasing her buprenorphine patch.  On exam patient overall well-appearing and nontoxic.  Vital signs reassuring.  Generalized abdominal tenderness on exam but no signs of focal tenderness and bowel sounds are present in all 4 quadrants.  And these are slightly decreased though.  Patient has no signs of peritonitis.  No focal neuro deficit noted on exam.  Neurovascularly intact in all extremities.  Lab work has been reassuring.  No leukocytosis.  Hemoglobin at baseline.  No significant electrolyte derangement.  Normal liver enzymes.  Lipase was normal.  Negative pregnancy test.  UA shows no signs of infection.  She had CT scan prior to my examination along with pelvic ultrasound.  CT scan shows a generous appendix but no signs of acute infection.  Also notes large amount of retained stool in the colon and question constipation.  No other acute ab normalities were noted.  Ultrasound shows a fibroids but no other acute ab normalities.  Patient with history of chronic constipation caused by gastroparesis.  She has not taken her Linzess in 3 days.  Suspect the patient's abdominal pain is secondary to constipation.  Patient's pain nausea controlled with Bentyl and Zofran in the ED.  She was given fluids as well.  Patient denies any vaginal discharge.  Clinical presentation does not seem consistent with PID.  I offered rectal exam to excess for fecal impaction however patient declined.  She has recommended that I provide her prescription for prepopi.  Which has been prescribed by her GI doctor in the past that has helped with her constipation.  Patient reports dizziness and nausea after recently restarting her buprenorphine patch.  I also suspect that patient's dizziness could be related to dehydration given her vomiting.  She states that the dizziness is positional and worse  with standing.  Patient given fluids in the ED.  Her dizziness is completely resolved.  I offered CT scan of head however patient declined.  Low suspicion for any intracranial abnormalities.  Do not feel the patient's dizziness is cardiac or pulmonary in nature.   Pt is hemodynamically stable, in NAD, & able to ambulate in the ED. Evaluation does not show pathology that would require ongoing emergent intervention or inpatient treatment. I explained the diagnosis to the patient. Pain has been managed & has no complaints prior  to dc. Pt is comfortable with above plan and is stable for discharge at this time. All questions were answered prior to disposition. Strict return precautions for f/u to the ED were discussed. Encouraged follow up with PCP, pain management and GI doctor this week.   Final Clinical Impressions(s) / ED Diagnoses   Final diagnoses:  Dizziness  Nausea and vomiting, intractability of vomiting not specified, unspecified vomiting type  Constipation, unspecified constipation type  Lower abdominal pain    ED Discharge Orders        Ordered    ondansetron (ZOFRAN ODT) 4 MG disintegrating tablet  Every 8 hours PRN     01/09/18 0148    Sod Picosulfate-Mag Ox-Cit Acd 10-3.5-12 MG-GM-GM PACK  Daily     01/09/18 0148       Doristine Devoid, PA-C 01/10/18 0747    Merrily Pew, MD 01/10/18 2303

## 2018-01-13 DIAGNOSIS — K3184 Gastroparesis: Secondary | ICD-10-CM | POA: Diagnosis not present

## 2018-01-13 DIAGNOSIS — K5902 Outlet dysfunction constipation: Secondary | ICD-10-CM | POA: Diagnosis not present

## 2018-01-27 NOTE — Telephone Encounter (Signed)
[facility] refused patient a second time and again suggested Dr. Benita Stabile in [place].  Left this on Dr. Blanch Media desk to review.

## 2018-02-16 NOTE — Telephone Encounter (Signed)
Still on Dr. Blanch Media desk to review

## 2018-02-18 DIAGNOSIS — R3915 Urgency of urination: Secondary | ICD-10-CM | POA: Diagnosis not present

## 2018-02-18 DIAGNOSIS — E1065 Type 1 diabetes mellitus with hyperglycemia: Secondary | ICD-10-CM | POA: Diagnosis not present

## 2018-02-18 DIAGNOSIS — E10649 Type 1 diabetes mellitus with hypoglycemia without coma: Secondary | ICD-10-CM | POA: Diagnosis not present

## 2018-02-18 DIAGNOSIS — E1043 Type 1 diabetes mellitus with diabetic autonomic (poly)neuropathy: Secondary | ICD-10-CM | POA: Diagnosis not present

## 2018-02-18 DIAGNOSIS — N3000 Acute cystitis without hematuria: Secondary | ICD-10-CM | POA: Diagnosis not present

## 2018-02-18 DIAGNOSIS — Z794 Long term (current) use of insulin: Secondary | ICD-10-CM | POA: Diagnosis not present

## 2018-02-18 DIAGNOSIS — K3184 Gastroparesis: Secondary | ICD-10-CM | POA: Diagnosis not present

## 2018-02-24 DIAGNOSIS — M3500 Sicca syndrome, unspecified: Secondary | ICD-10-CM | POA: Insufficient documentation

## 2018-02-24 DIAGNOSIS — R768 Other specified abnormal immunological findings in serum: Secondary | ICD-10-CM | POA: Insufficient documentation

## 2018-02-24 HISTORY — DX: Sjogren syndrome, unspecified: M35.00

## 2018-02-25 DIAGNOSIS — R768 Other specified abnormal immunological findings in serum: Secondary | ICD-10-CM | POA: Diagnosis not present

## 2018-02-25 DIAGNOSIS — M3500 Sicca syndrome, unspecified: Secondary | ICD-10-CM | POA: Diagnosis not present

## 2018-02-25 DIAGNOSIS — D550 Anemia due to glucose-6-phosphate dehydrogenase [G6PD] deficiency: Secondary | ICD-10-CM | POA: Diagnosis not present

## 2018-02-25 DIAGNOSIS — M797 Fibromyalgia: Secondary | ICD-10-CM | POA: Diagnosis not present

## 2018-03-09 DIAGNOSIS — J069 Acute upper respiratory infection, unspecified: Secondary | ICD-10-CM | POA: Diagnosis not present

## 2018-03-18 DIAGNOSIS — M7502 Adhesive capsulitis of left shoulder: Secondary | ICD-10-CM | POA: Diagnosis not present

## 2018-03-18 DIAGNOSIS — M25512 Pain in left shoulder: Secondary | ICD-10-CM | POA: Diagnosis not present

## 2018-03-18 DIAGNOSIS — M19012 Primary osteoarthritis, left shoulder: Secondary | ICD-10-CM | POA: Diagnosis not present

## 2018-03-24 DIAGNOSIS — M7502 Adhesive capsulitis of left shoulder: Secondary | ICD-10-CM | POA: Insufficient documentation

## 2018-04-19 DIAGNOSIS — M7502 Adhesive capsulitis of left shoulder: Secondary | ICD-10-CM | POA: Diagnosis not present

## 2018-04-21 DIAGNOSIS — R1031 Right lower quadrant pain: Secondary | ICD-10-CM | POA: Diagnosis not present

## 2018-04-21 DIAGNOSIS — K5902 Outlet dysfunction constipation: Secondary | ICD-10-CM | POA: Diagnosis not present

## 2018-04-21 DIAGNOSIS — K3184 Gastroparesis: Secondary | ICD-10-CM | POA: Diagnosis not present

## 2018-04-21 DIAGNOSIS — K5900 Constipation, unspecified: Secondary | ICD-10-CM | POA: Diagnosis not present

## 2018-05-03 DIAGNOSIS — K5902 Outlet dysfunction constipation: Secondary | ICD-10-CM | POA: Diagnosis not present

## 2018-05-03 DIAGNOSIS — K3184 Gastroparesis: Secondary | ICD-10-CM | POA: Diagnosis not present

## 2018-05-03 DIAGNOSIS — Z09 Encounter for follow-up examination after completed treatment for conditions other than malignant neoplasm: Secondary | ICD-10-CM | POA: Diagnosis not present

## 2018-05-10 DIAGNOSIS — K5900 Constipation, unspecified: Secondary | ICD-10-CM | POA: Diagnosis not present

## 2018-05-10 DIAGNOSIS — Z3202 Encounter for pregnancy test, result negative: Secondary | ICD-10-CM | POA: Diagnosis not present

## 2018-05-10 DIAGNOSIS — R1084 Generalized abdominal pain: Secondary | ICD-10-CM | POA: Diagnosis not present

## 2018-05-10 DIAGNOSIS — R110 Nausea: Secondary | ICD-10-CM | POA: Diagnosis not present

## 2018-05-11 DIAGNOSIS — R21 Rash and other nonspecific skin eruption: Secondary | ICD-10-CM | POA: Diagnosis not present

## 2018-05-11 DIAGNOSIS — Z79899 Other long term (current) drug therapy: Secondary | ICD-10-CM | POA: Diagnosis not present

## 2018-05-11 DIAGNOSIS — M1990 Unspecified osteoarthritis, unspecified site: Secondary | ICD-10-CM | POA: Diagnosis not present

## 2018-05-24 DIAGNOSIS — M7502 Adhesive capsulitis of left shoulder: Secondary | ICD-10-CM | POA: Diagnosis not present

## 2018-05-24 DIAGNOSIS — M19012 Primary osteoarthritis, left shoulder: Secondary | ICD-10-CM | POA: Diagnosis not present

## 2018-05-24 DIAGNOSIS — M7542 Impingement syndrome of left shoulder: Secondary | ICD-10-CM | POA: Diagnosis not present

## 2018-05-24 DIAGNOSIS — E109 Type 1 diabetes mellitus without complications: Secondary | ICD-10-CM | POA: Diagnosis not present

## 2018-05-25 DIAGNOSIS — R110 Nausea: Secondary | ICD-10-CM | POA: Diagnosis not present

## 2018-05-25 DIAGNOSIS — R1011 Right upper quadrant pain: Secondary | ICD-10-CM | POA: Diagnosis not present

## 2018-05-25 DIAGNOSIS — K589 Irritable bowel syndrome without diarrhea: Secondary | ICD-10-CM | POA: Diagnosis not present

## 2018-05-25 DIAGNOSIS — R143 Flatulence: Secondary | ICD-10-CM | POA: Diagnosis not present

## 2018-05-25 DIAGNOSIS — R6881 Early satiety: Secondary | ICD-10-CM | POA: Diagnosis not present

## 2018-05-26 DIAGNOSIS — E1065 Type 1 diabetes mellitus with hyperglycemia: Secondary | ICD-10-CM | POA: Diagnosis not present

## 2018-05-26 DIAGNOSIS — E109 Type 1 diabetes mellitus without complications: Secondary | ICD-10-CM | POA: Diagnosis not present

## 2018-05-26 DIAGNOSIS — Z794 Long term (current) use of insulin: Secondary | ICD-10-CM | POA: Diagnosis not present

## 2018-06-01 DIAGNOSIS — K3184 Gastroparesis: Secondary | ICD-10-CM | POA: Diagnosis not present

## 2018-06-01 DIAGNOSIS — E109 Type 1 diabetes mellitus without complications: Secondary | ICD-10-CM | POA: Diagnosis not present

## 2018-06-01 DIAGNOSIS — Z9641 Presence of insulin pump (external) (internal): Secondary | ICD-10-CM | POA: Diagnosis not present

## 2018-06-01 DIAGNOSIS — E1043 Type 1 diabetes mellitus with diabetic autonomic (poly)neuropathy: Secondary | ICD-10-CM | POA: Diagnosis not present

## 2018-06-01 DIAGNOSIS — R739 Hyperglycemia, unspecified: Secondary | ICD-10-CM | POA: Diagnosis not present

## 2018-06-01 DIAGNOSIS — E1065 Type 1 diabetes mellitus with hyperglycemia: Secondary | ICD-10-CM | POA: Diagnosis not present

## 2018-06-01 DIAGNOSIS — T380X5A Adverse effect of glucocorticoids and synthetic analogues, initial encounter: Secondary | ICD-10-CM | POA: Diagnosis not present

## 2018-06-11 DIAGNOSIS — E7800 Pure hypercholesterolemia, unspecified: Secondary | ICD-10-CM | POA: Diagnosis not present

## 2018-06-11 DIAGNOSIS — Z0000 Encounter for general adult medical examination without abnormal findings: Secondary | ICD-10-CM | POA: Diagnosis not present

## 2018-06-15 DIAGNOSIS — E1065 Type 1 diabetes mellitus with hyperglycemia: Secondary | ICD-10-CM | POA: Diagnosis not present

## 2018-06-15 DIAGNOSIS — K3184 Gastroparesis: Secondary | ICD-10-CM | POA: Diagnosis not present

## 2018-06-15 DIAGNOSIS — E1043 Type 1 diabetes mellitus with diabetic autonomic (poly)neuropathy: Secondary | ICD-10-CM | POA: Diagnosis not present

## 2018-06-15 DIAGNOSIS — Z9641 Presence of insulin pump (external) (internal): Secondary | ICD-10-CM | POA: Diagnosis not present

## 2018-06-30 DIAGNOSIS — M19012 Primary osteoarthritis, left shoulder: Secondary | ICD-10-CM | POA: Diagnosis not present

## 2018-06-30 DIAGNOSIS — G8918 Other acute postprocedural pain: Secondary | ICD-10-CM | POA: Diagnosis not present

## 2018-06-30 DIAGNOSIS — M7502 Adhesive capsulitis of left shoulder: Secondary | ICD-10-CM | POA: Diagnosis not present

## 2018-06-30 DIAGNOSIS — M7542 Impingement syndrome of left shoulder: Secondary | ICD-10-CM | POA: Diagnosis not present

## 2018-06-30 DIAGNOSIS — M25712 Osteophyte, left shoulder: Secondary | ICD-10-CM | POA: Diagnosis not present

## 2018-06-30 DIAGNOSIS — M65812 Other synovitis and tenosynovitis, left shoulder: Secondary | ICD-10-CM | POA: Diagnosis not present

## 2018-07-02 DIAGNOSIS — M25612 Stiffness of left shoulder, not elsewhere classified: Secondary | ICD-10-CM | POA: Diagnosis not present

## 2018-07-02 DIAGNOSIS — Z4789 Encounter for other orthopedic aftercare: Secondary | ICD-10-CM | POA: Diagnosis not present

## 2018-07-02 DIAGNOSIS — M25512 Pain in left shoulder: Secondary | ICD-10-CM | POA: Diagnosis not present

## 2018-07-02 DIAGNOSIS — M7502 Adhesive capsulitis of left shoulder: Secondary | ICD-10-CM | POA: Diagnosis not present

## 2018-07-16 IMAGING — CR DG ABDOMEN 1V
2 series · 2 of 2 positions shown · non-contrast
Comparison: None.

CLINICAL DATA: Generalized abdominal pain, nausea

EXAM:
ABDOMEN - 1 VIEW

[t abdomen supine (1 of 2)]
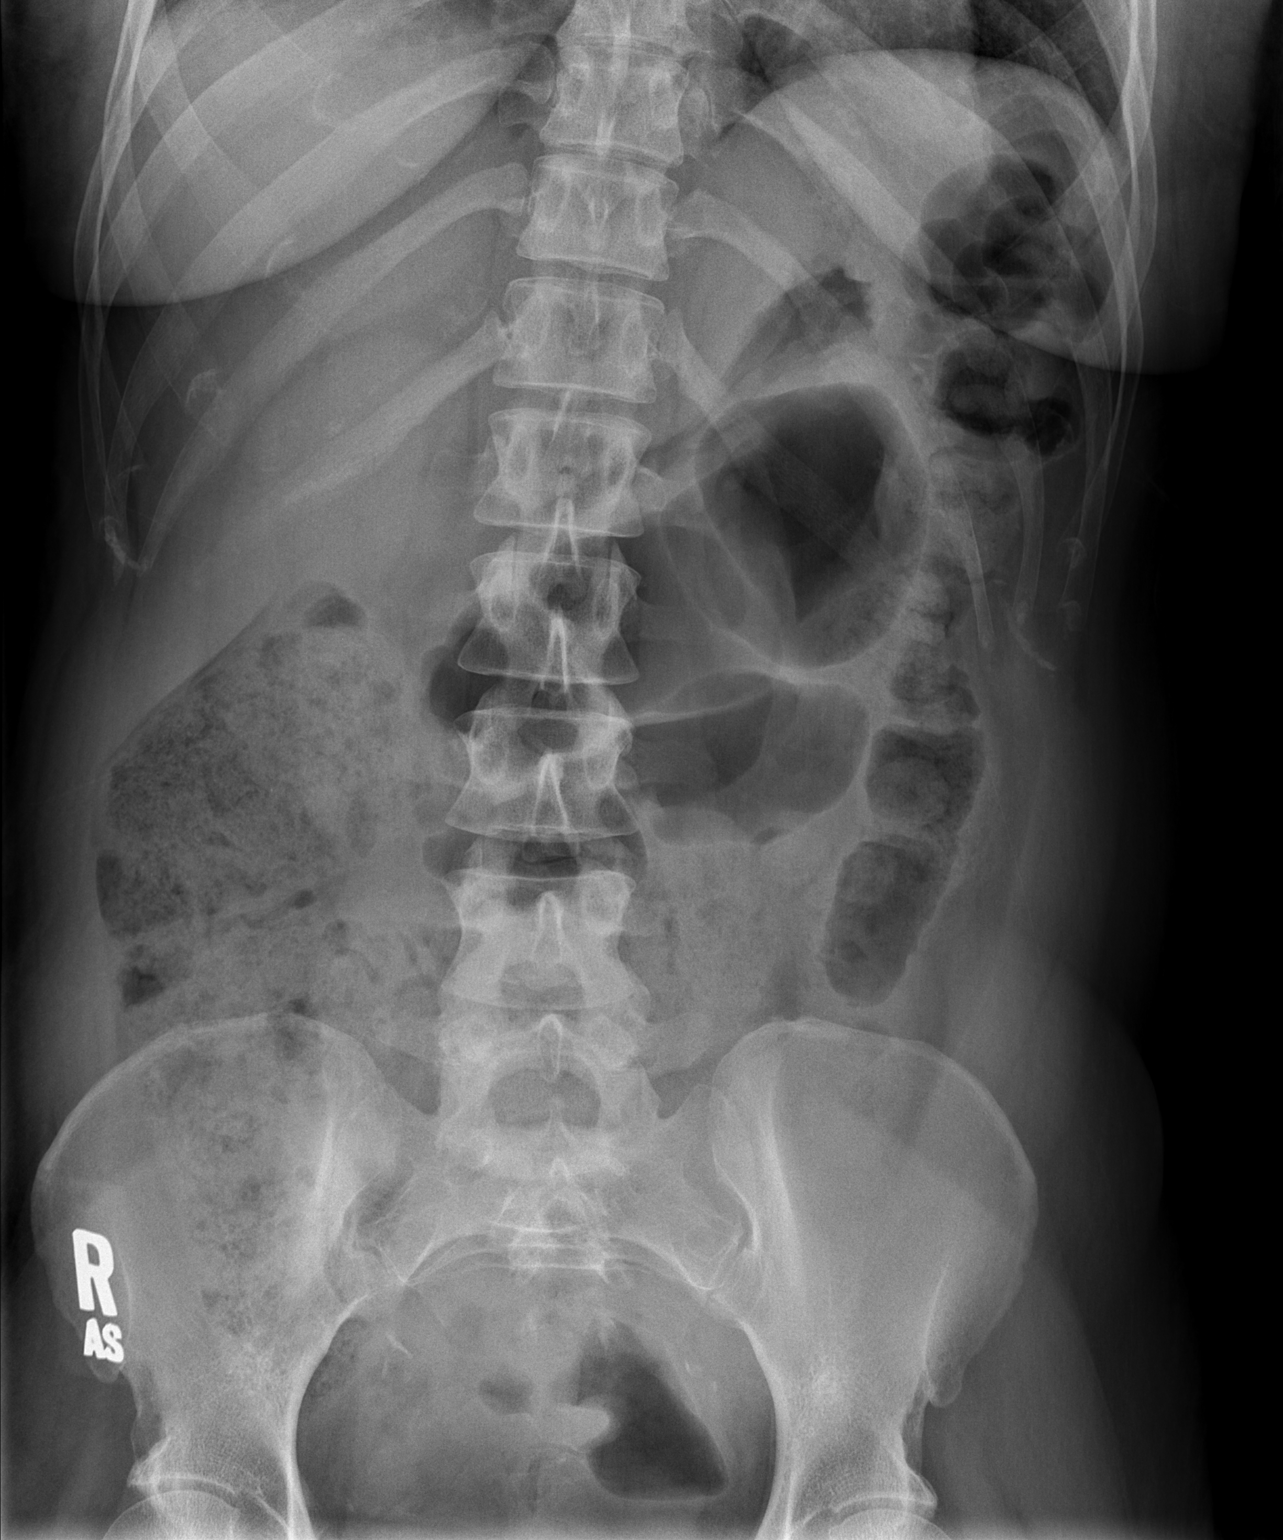

[t abdomen supine (2 of 2)]
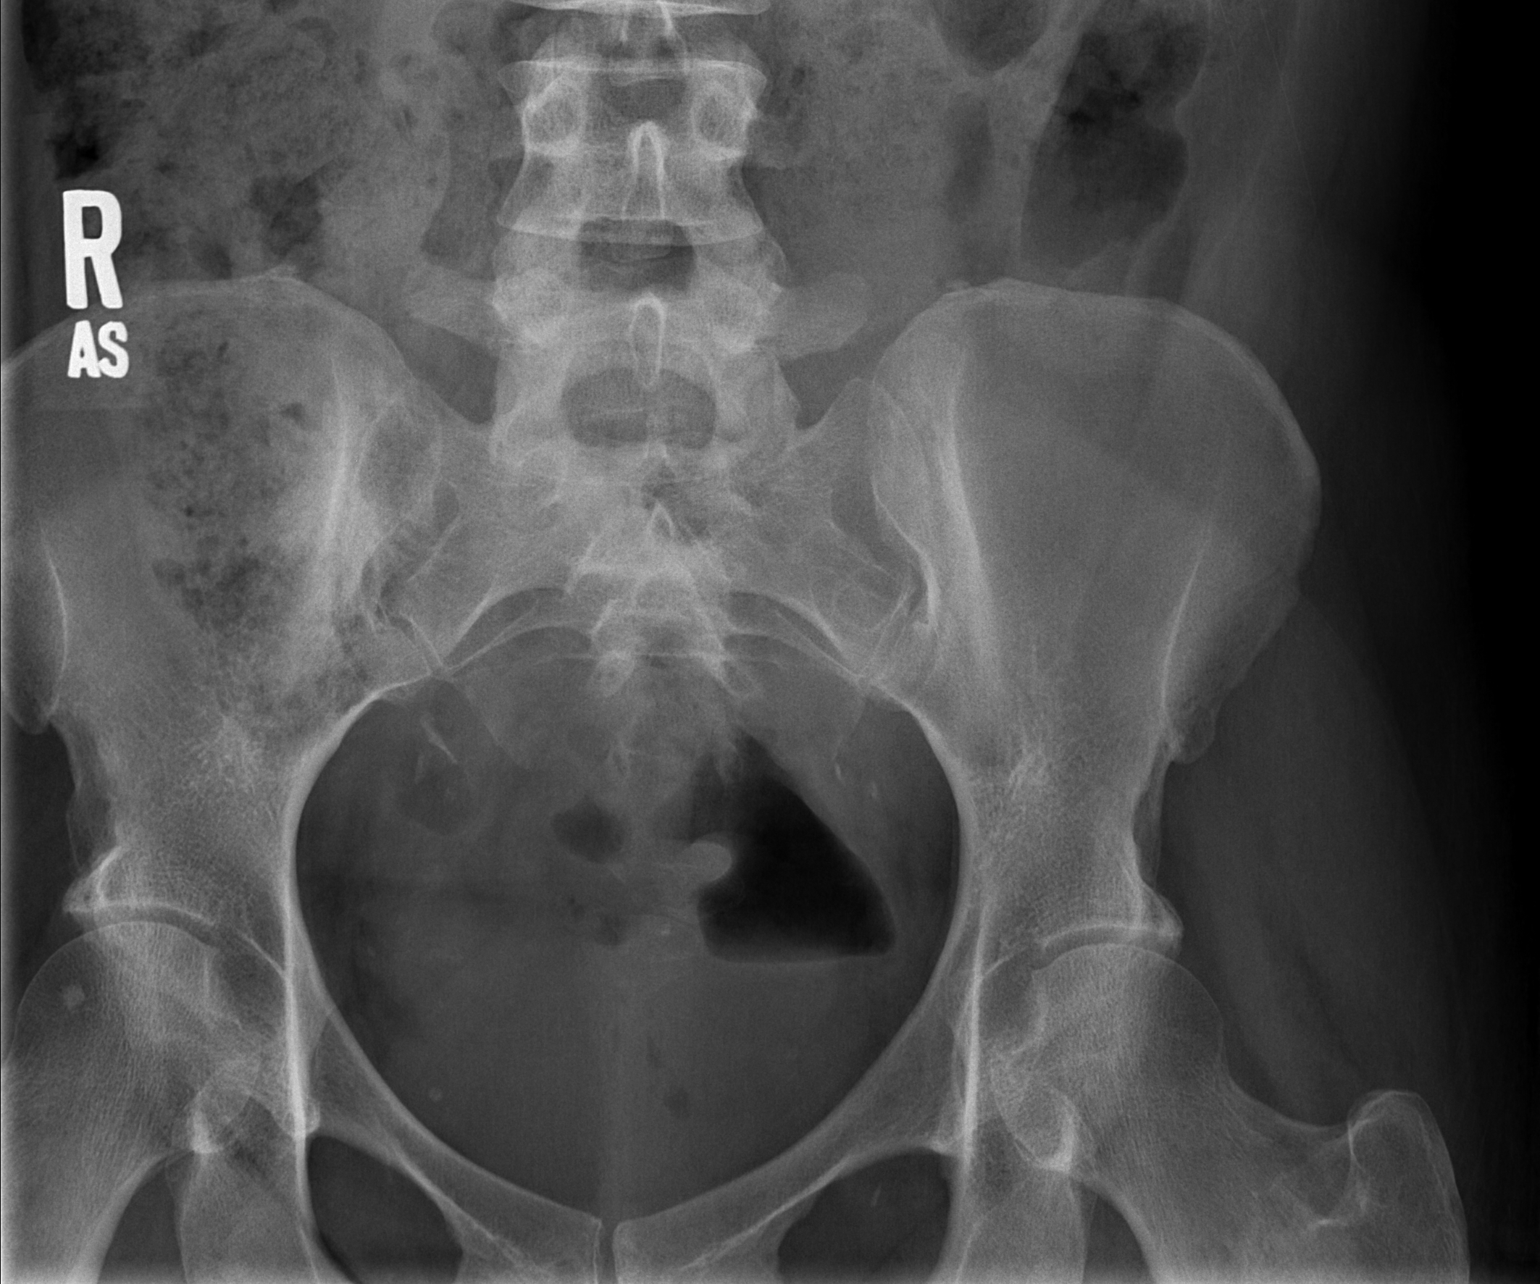

[2 of 2 positions shown; findings below may reference images not displayed]

FINDINGS: There is normal small bowel gas pattern. Moderate colonic stool in
right colon and proximal transverse colon. Moderate colonic gas in
distal transverse colon. Small to moderate stool and gas noted in
descending colon
IMPRESSION: Normal small bowel gas pattern. Colonic stool and gas as described
above.

## 2018-07-23 DIAGNOSIS — R109 Unspecified abdominal pain: Secondary | ICD-10-CM | POA: Diagnosis not present

## 2018-07-23 DIAGNOSIS — K645 Perianal venous thrombosis: Secondary | ICD-10-CM | POA: Diagnosis not present

## 2018-07-23 NOTE — ED Notes (Signed)
Coming from [facility] walk in clinic-states sending patient to ED for abdominal work-up-states pain with rebound and guarding

## 2018-07-27 DIAGNOSIS — K649 Unspecified hemorrhoids: Secondary | ICD-10-CM | POA: Diagnosis not present

## 2018-08-10 DIAGNOSIS — M1990 Unspecified osteoarthritis, unspecified site: Secondary | ICD-10-CM | POA: Diagnosis not present

## 2018-08-10 DIAGNOSIS — M160 Bilateral primary osteoarthritis of hip: Secondary | ICD-10-CM | POA: Diagnosis not present

## 2018-08-10 DIAGNOSIS — D75A Glucose-6-phosphate dehydrogenase (G6PD) deficiency without anemia: Secondary | ICD-10-CM | POA: Diagnosis not present

## 2018-08-10 DIAGNOSIS — Z79899 Other long term (current) drug therapy: Secondary | ICD-10-CM | POA: Diagnosis not present

## 2018-08-19 DIAGNOSIS — Z794 Long term (current) use of insulin: Secondary | ICD-10-CM | POA: Diagnosis not present

## 2018-08-19 DIAGNOSIS — E109 Type 1 diabetes mellitus without complications: Secondary | ICD-10-CM | POA: Diagnosis not present

## 2018-08-19 DIAGNOSIS — E1065 Type 1 diabetes mellitus with hyperglycemia: Secondary | ICD-10-CM | POA: Diagnosis not present

## 2018-08-24 DIAGNOSIS — K5901 Slow transit constipation: Secondary | ICD-10-CM | POA: Diagnosis not present

## 2018-09-02 DIAGNOSIS — Z79899 Other long term (current) drug therapy: Secondary | ICD-10-CM | POA: Diagnosis not present

## 2018-09-02 DIAGNOSIS — G894 Chronic pain syndrome: Secondary | ICD-10-CM | POA: Diagnosis not present

## 2018-09-02 DIAGNOSIS — R21 Rash and other nonspecific skin eruption: Secondary | ICD-10-CM | POA: Diagnosis not present

## 2018-09-02 DIAGNOSIS — M7542 Impingement syndrome of left shoulder: Secondary | ICD-10-CM | POA: Diagnosis not present

## 2018-09-02 DIAGNOSIS — D75A Glucose-6-phosphate dehydrogenase (G6PD) deficiency without anemia: Secondary | ICD-10-CM | POA: Diagnosis not present

## 2018-09-02 DIAGNOSIS — M0609 Rheumatoid arthritis without rheumatoid factor, multiple sites: Secondary | ICD-10-CM | POA: Diagnosis not present

## 2018-09-02 DIAGNOSIS — E1065 Type 1 diabetes mellitus with hyperglycemia: Secondary | ICD-10-CM | POA: Diagnosis not present

## 2018-09-02 DIAGNOSIS — R768 Other specified abnormal immunological findings in serum: Secondary | ICD-10-CM | POA: Diagnosis not present

## 2018-09-06 DIAGNOSIS — K3184 Gastroparesis: Secondary | ICD-10-CM | POA: Diagnosis not present

## 2018-09-06 DIAGNOSIS — Z9641 Presence of insulin pump (external) (internal): Secondary | ICD-10-CM | POA: Diagnosis not present

## 2018-09-06 DIAGNOSIS — E1065 Type 1 diabetes mellitus with hyperglycemia: Secondary | ICD-10-CM | POA: Diagnosis not present

## 2018-09-06 DIAGNOSIS — E1043 Type 1 diabetes mellitus with diabetic autonomic (poly)neuropathy: Secondary | ICD-10-CM | POA: Diagnosis not present

## 2018-09-21 DIAGNOSIS — K5900 Constipation, unspecified: Secondary | ICD-10-CM | POA: Diagnosis not present

## 2018-09-22 DIAGNOSIS — L280 Lichen simplex chronicus: Secondary | ICD-10-CM | POA: Diagnosis not present

## 2018-09-23 DIAGNOSIS — M8540 Solitary bone cyst, unspecified site: Secondary | ICD-10-CM | POA: Diagnosis not present

## 2018-09-23 DIAGNOSIS — M1611 Unilateral primary osteoarthritis, right hip: Secondary | ICD-10-CM | POA: Diagnosis not present

## 2018-09-28 DIAGNOSIS — M1611 Unilateral primary osteoarthritis, right hip: Secondary | ICD-10-CM | POA: Insufficient documentation

## 2018-10-01 DIAGNOSIS — K3184 Gastroparesis: Secondary | ICD-10-CM | POA: Diagnosis not present

## 2018-10-01 DIAGNOSIS — E1065 Type 1 diabetes mellitus with hyperglycemia: Secondary | ICD-10-CM | POA: Diagnosis not present

## 2018-10-01 DIAGNOSIS — E1043 Type 1 diabetes mellitus with diabetic autonomic (poly)neuropathy: Secondary | ICD-10-CM | POA: Diagnosis not present

## 2018-10-01 DIAGNOSIS — T380X5A Adverse effect of glucocorticoids and synthetic analogues, initial encounter: Secondary | ICD-10-CM | POA: Diagnosis not present

## 2018-10-21 DIAGNOSIS — E7800 Pure hypercholesterolemia, unspecified: Secondary | ICD-10-CM | POA: Diagnosis not present

## 2018-10-21 DIAGNOSIS — K5900 Constipation, unspecified: Secondary | ICD-10-CM | POA: Diagnosis not present

## 2018-10-21 DIAGNOSIS — M359 Systemic involvement of connective tissue, unspecified: Secondary | ICD-10-CM | POA: Diagnosis not present

## 2018-10-21 DIAGNOSIS — R0789 Other chest pain: Secondary | ICD-10-CM | POA: Diagnosis not present

## 2018-11-19 DIAGNOSIS — E109 Type 1 diabetes mellitus without complications: Secondary | ICD-10-CM | POA: Diagnosis not present

## 2018-11-19 DIAGNOSIS — E1065 Type 1 diabetes mellitus with hyperglycemia: Secondary | ICD-10-CM | POA: Diagnosis not present

## 2018-11-19 DIAGNOSIS — Z794 Long term (current) use of insulin: Secondary | ICD-10-CM | POA: Diagnosis not present

## 2018-12-02 DIAGNOSIS — E7800 Pure hypercholesterolemia, unspecified: Secondary | ICD-10-CM | POA: Diagnosis not present

## 2018-12-02 DIAGNOSIS — E119 Type 2 diabetes mellitus without complications: Secondary | ICD-10-CM | POA: Diagnosis not present

## 2018-12-02 DIAGNOSIS — R079 Chest pain, unspecified: Secondary | ICD-10-CM | POA: Diagnosis not present

## 2018-12-02 DIAGNOSIS — R0789 Other chest pain: Secondary | ICD-10-CM | POA: Diagnosis not present

## 2018-12-13 DIAGNOSIS — M25561 Pain in right knee: Secondary | ICD-10-CM | POA: Diagnosis not present

## 2018-12-13 DIAGNOSIS — Z79899 Other long term (current) drug therapy: Secondary | ICD-10-CM | POA: Diagnosis not present

## 2018-12-13 DIAGNOSIS — M1990 Unspecified osteoarthritis, unspecified site: Secondary | ICD-10-CM | POA: Diagnosis not present

## 2018-12-13 DIAGNOSIS — M25562 Pain in left knee: Secondary | ICD-10-CM | POA: Diagnosis not present

## 2018-12-24 DIAGNOSIS — Z794 Long term (current) use of insulin: Secondary | ICD-10-CM | POA: Diagnosis not present

## 2018-12-24 DIAGNOSIS — E109 Type 1 diabetes mellitus without complications: Secondary | ICD-10-CM | POA: Diagnosis not present

## 2018-12-24 DIAGNOSIS — E1065 Type 1 diabetes mellitus with hyperglycemia: Secondary | ICD-10-CM | POA: Diagnosis not present

## 2018-12-30 DIAGNOSIS — R8290 Unspecified abnormal findings in urine: Secondary | ICD-10-CM | POA: Diagnosis not present

## 2019-01-10 DIAGNOSIS — M0609 Rheumatoid arthritis without rheumatoid factor, multiple sites: Secondary | ICD-10-CM | POA: Diagnosis not present

## 2019-01-10 DIAGNOSIS — M2550 Pain in unspecified joint: Secondary | ICD-10-CM | POA: Diagnosis not present

## 2019-01-10 DIAGNOSIS — Z79899 Other long term (current) drug therapy: Secondary | ICD-10-CM | POA: Diagnosis not present

## 2019-01-19 DIAGNOSIS — E1065 Type 1 diabetes mellitus with hyperglycemia: Secondary | ICD-10-CM | POA: Diagnosis not present

## 2019-01-19 DIAGNOSIS — Z9641 Presence of insulin pump (external) (internal): Secondary | ICD-10-CM | POA: Diagnosis not present

## 2019-01-19 DIAGNOSIS — E1043 Type 1 diabetes mellitus with diabetic autonomic (poly)neuropathy: Secondary | ICD-10-CM | POA: Diagnosis not present

## 2019-01-19 DIAGNOSIS — K3184 Gastroparesis: Secondary | ICD-10-CM | POA: Diagnosis not present

## 2019-02-11 DIAGNOSIS — M0609 Rheumatoid arthritis without rheumatoid factor, multiple sites: Secondary | ICD-10-CM | POA: Diagnosis not present

## 2019-02-11 DIAGNOSIS — M1990 Unspecified osteoarthritis, unspecified site: Secondary | ICD-10-CM | POA: Diagnosis not present

## 2019-02-11 DIAGNOSIS — Z79899 Other long term (current) drug therapy: Secondary | ICD-10-CM | POA: Diagnosis not present

## 2019-02-18 DIAGNOSIS — E109 Type 1 diabetes mellitus without complications: Secondary | ICD-10-CM | POA: Diagnosis not present

## 2019-02-18 DIAGNOSIS — Z794 Long term (current) use of insulin: Secondary | ICD-10-CM | POA: Diagnosis not present

## 2019-02-18 DIAGNOSIS — E1065 Type 1 diabetes mellitus with hyperglycemia: Secondary | ICD-10-CM | POA: Diagnosis not present

## 2019-03-24 DIAGNOSIS — Z794 Long term (current) use of insulin: Secondary | ICD-10-CM | POA: Diagnosis not present

## 2019-03-24 DIAGNOSIS — E109 Type 1 diabetes mellitus without complications: Secondary | ICD-10-CM | POA: Diagnosis not present

## 2019-03-24 DIAGNOSIS — E1065 Type 1 diabetes mellitus with hyperglycemia: Secondary | ICD-10-CM | POA: Diagnosis not present

## 2019-03-28 DIAGNOSIS — E1065 Type 1 diabetes mellitus with hyperglycemia: Secondary | ICD-10-CM | POA: Diagnosis not present

## 2019-04-22 DIAGNOSIS — E1065 Type 1 diabetes mellitus with hyperglycemia: Secondary | ICD-10-CM | POA: Diagnosis not present

## 2019-04-22 DIAGNOSIS — Z9641 Presence of insulin pump (external) (internal): Secondary | ICD-10-CM | POA: Diagnosis not present

## 2019-05-23 DIAGNOSIS — E109 Type 1 diabetes mellitus without complications: Secondary | ICD-10-CM | POA: Diagnosis not present

## 2019-05-23 DIAGNOSIS — E1065 Type 1 diabetes mellitus with hyperglycemia: Secondary | ICD-10-CM | POA: Diagnosis not present

## 2019-05-23 DIAGNOSIS — Z794 Long term (current) use of insulin: Secondary | ICD-10-CM | POA: Diagnosis not present

## 2019-05-31 DIAGNOSIS — R399 Unspecified symptoms and signs involving the genitourinary system: Secondary | ICD-10-CM | POA: Diagnosis not present

## 2019-05-31 DIAGNOSIS — E7800 Pure hypercholesterolemia, unspecified: Secondary | ICD-10-CM | POA: Diagnosis not present

## 2019-05-31 DIAGNOSIS — M545 Low back pain: Secondary | ICD-10-CM | POA: Diagnosis not present

## 2019-06-13 DIAGNOSIS — K3184 Gastroparesis: Secondary | ICD-10-CM | POA: Diagnosis not present

## 2019-06-13 DIAGNOSIS — K5901 Slow transit constipation: Secondary | ICD-10-CM | POA: Diagnosis not present

## 2019-06-13 DIAGNOSIS — R1110 Vomiting, unspecified: Secondary | ICD-10-CM | POA: Diagnosis not present

## 2019-06-15 DIAGNOSIS — Z0000 Encounter for general adult medical examination without abnormal findings: Secondary | ICD-10-CM | POA: Diagnosis not present

## 2019-06-15 DIAGNOSIS — F322 Major depressive disorder, single episode, severe without psychotic features: Secondary | ICD-10-CM | POA: Diagnosis not present

## 2019-06-15 DIAGNOSIS — G43909 Migraine, unspecified, not intractable, without status migrainosus: Secondary | ICD-10-CM | POA: Diagnosis not present

## 2019-06-15 DIAGNOSIS — E119 Type 2 diabetes mellitus without complications: Secondary | ICD-10-CM | POA: Diagnosis not present

## 2019-06-15 DIAGNOSIS — E7800 Pure hypercholesterolemia, unspecified: Secondary | ICD-10-CM | POA: Diagnosis not present

## 2019-07-06 DIAGNOSIS — E109 Type 1 diabetes mellitus without complications: Secondary | ICD-10-CM | POA: Diagnosis not present

## 2019-07-06 DIAGNOSIS — Z794 Long term (current) use of insulin: Secondary | ICD-10-CM | POA: Diagnosis not present

## 2019-07-06 DIAGNOSIS — E1065 Type 1 diabetes mellitus with hyperglycemia: Secondary | ICD-10-CM | POA: Diagnosis not present

## 2019-07-14 DIAGNOSIS — E1065 Type 1 diabetes mellitus with hyperglycemia: Secondary | ICD-10-CM | POA: Diagnosis not present

## 2019-07-27 DIAGNOSIS — E1043 Type 1 diabetes mellitus with diabetic autonomic (poly)neuropathy: Secondary | ICD-10-CM | POA: Diagnosis not present

## 2019-07-27 DIAGNOSIS — E1065 Type 1 diabetes mellitus with hyperglycemia: Secondary | ICD-10-CM | POA: Diagnosis not present

## 2019-07-27 DIAGNOSIS — Z9641 Presence of insulin pump (external) (internal): Secondary | ICD-10-CM | POA: Diagnosis not present

## 2019-07-27 DIAGNOSIS — K3184 Gastroparesis: Secondary | ICD-10-CM | POA: Diagnosis not present

## 2019-08-13 DIAGNOSIS — E1065 Type 1 diabetes mellitus with hyperglycemia: Secondary | ICD-10-CM | POA: Diagnosis not present

## 2019-08-13 DIAGNOSIS — Z794 Long term (current) use of insulin: Secondary | ICD-10-CM | POA: Diagnosis not present

## 2019-08-13 DIAGNOSIS — E109 Type 1 diabetes mellitus without complications: Secondary | ICD-10-CM | POA: Diagnosis not present

## 2019-08-19 DIAGNOSIS — Z794 Long term (current) use of insulin: Secondary | ICD-10-CM | POA: Diagnosis not present

## 2019-08-19 DIAGNOSIS — E1065 Type 1 diabetes mellitus with hyperglycemia: Secondary | ICD-10-CM | POA: Diagnosis not present

## 2019-08-19 DIAGNOSIS — E109 Type 1 diabetes mellitus without complications: Secondary | ICD-10-CM | POA: Diagnosis not present

## 2019-08-21 IMAGING — CR DG CERVICAL SPINE COMPLETE 4+V
6 series · 6 of 6 positions shown · non-contrast
Comparison: None.

CLINICAL DATA: Pain after trauma

EXAM:
CERVICAL SPINE - COMPLETE 4+ VIEW

[w cervical spine lat (1 of 2)]
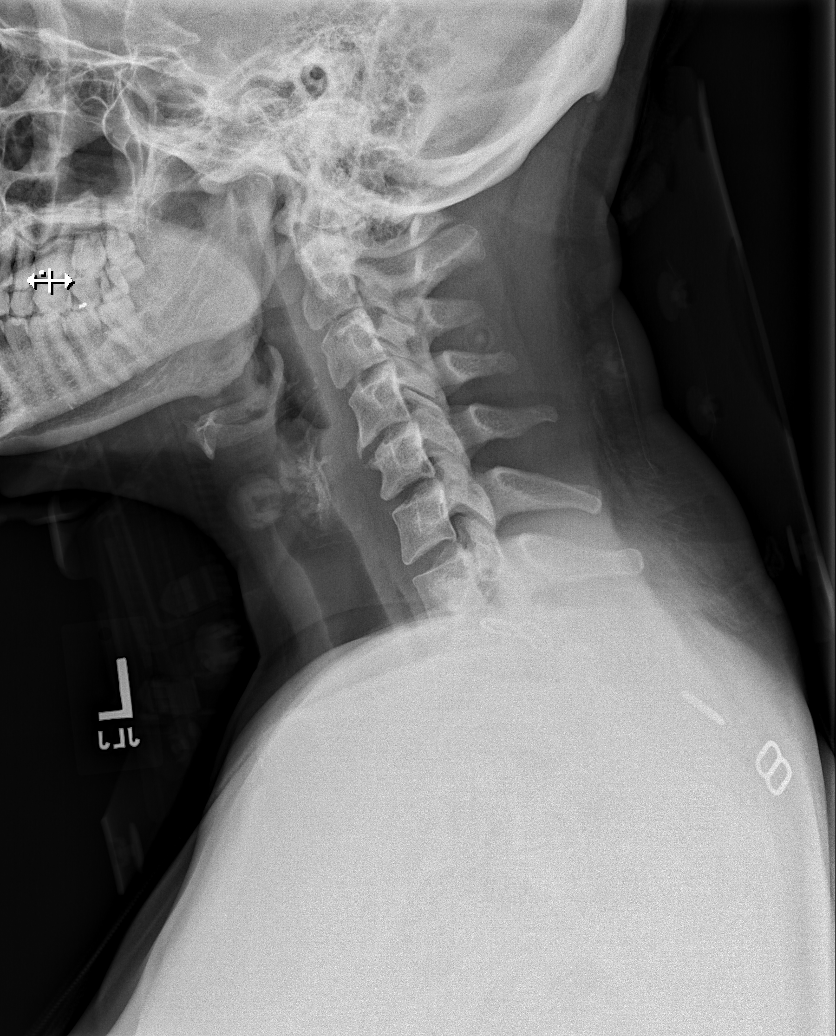

[w cervical spine lat (2 of 2)]
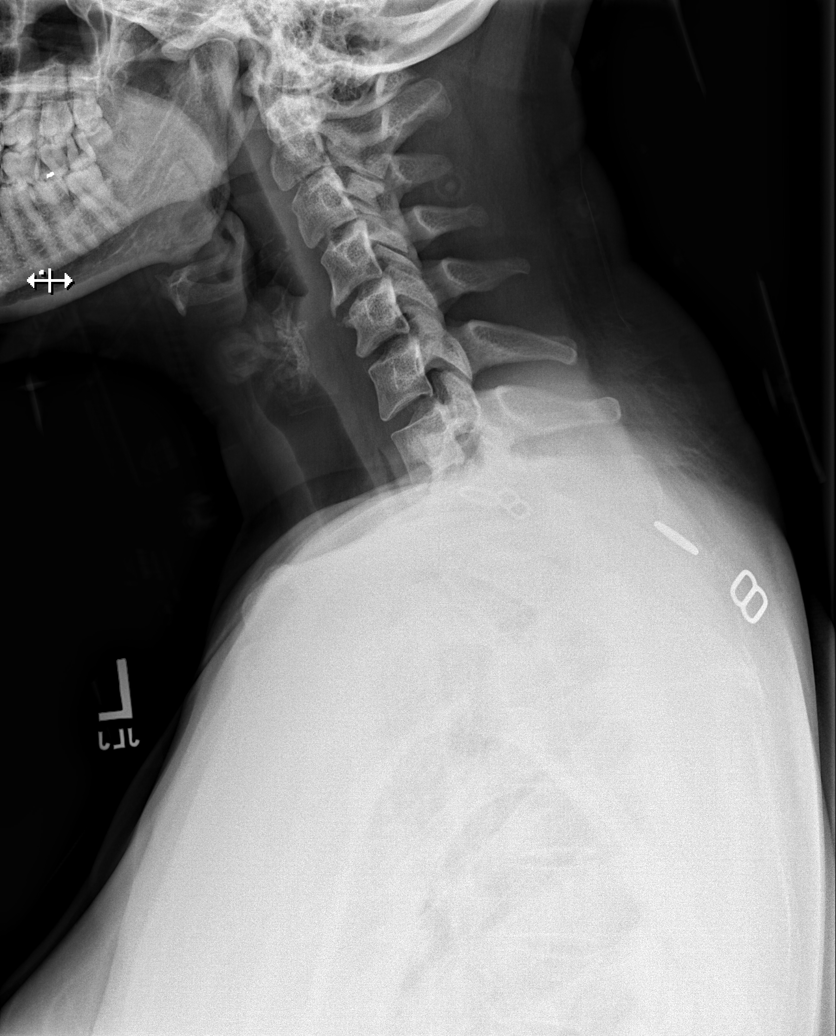

[w cervical spine ap_obl (1 of 2)]
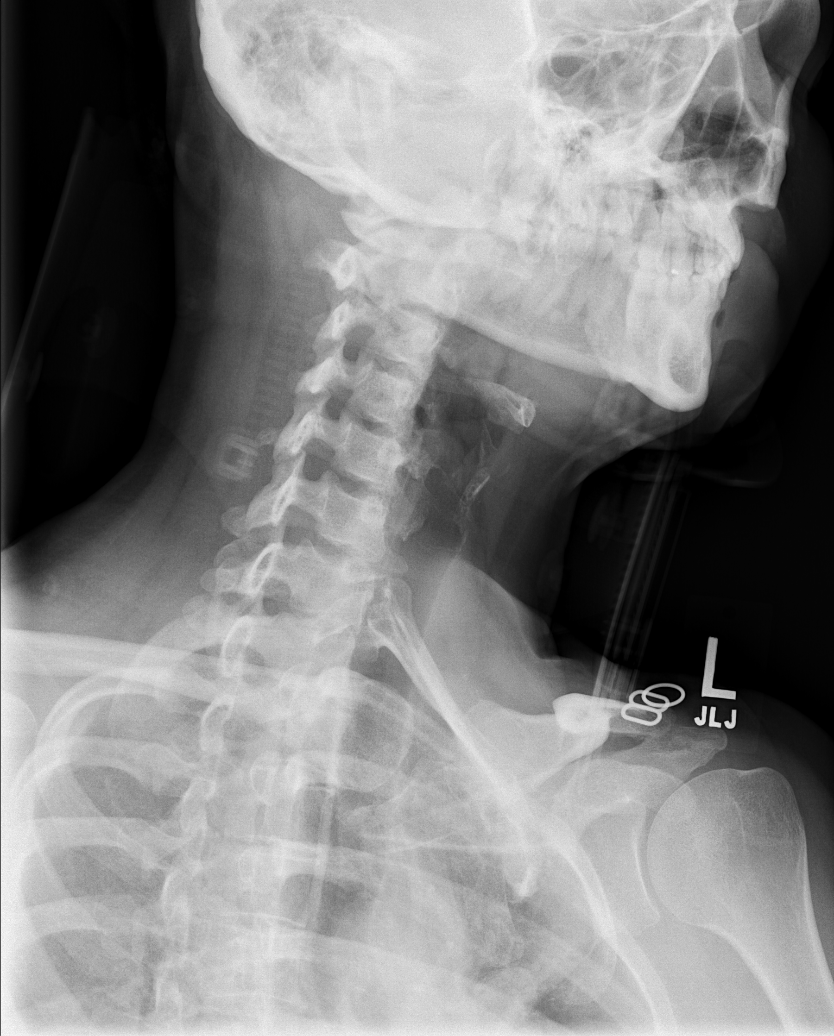

[w cervical spine ap_obl (2 of 2)]
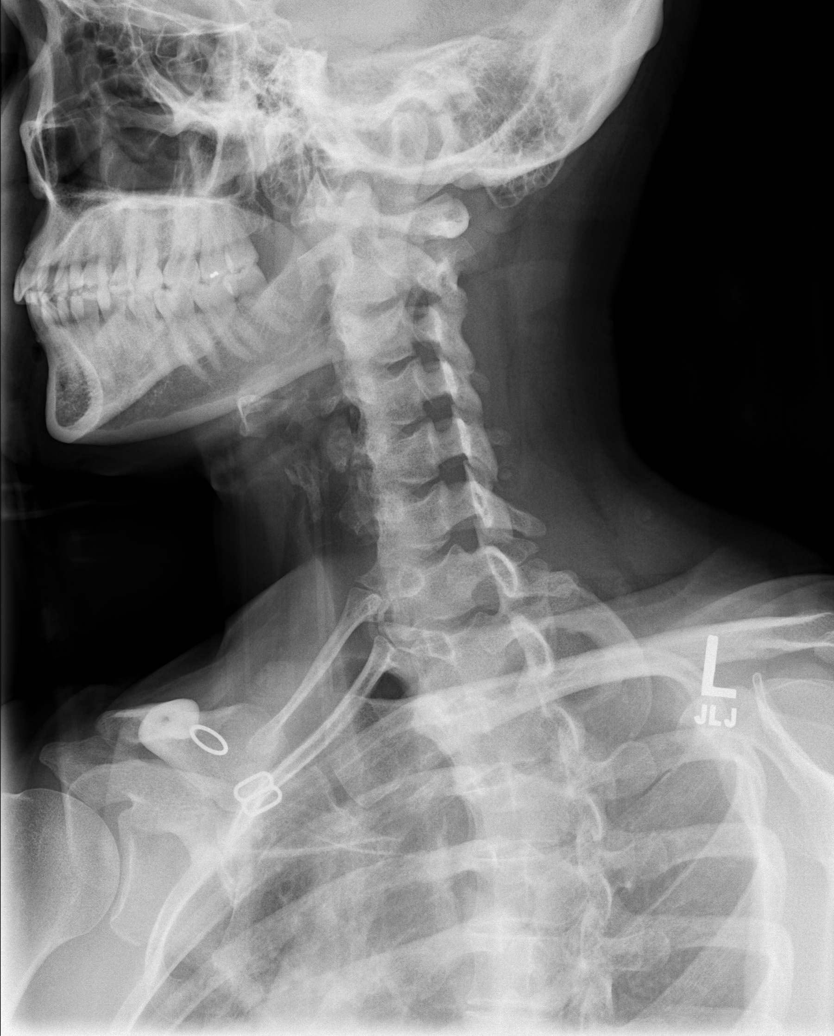

[w cervical spine ap]
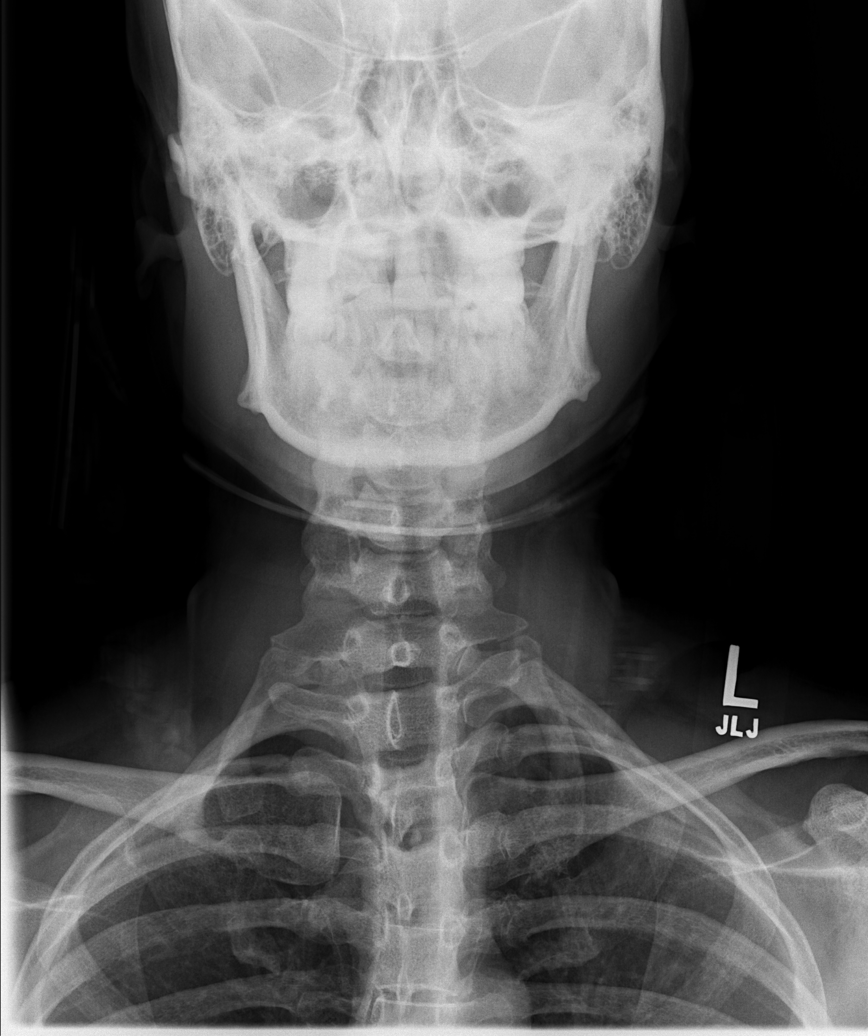

[w cervical spine odontoid]
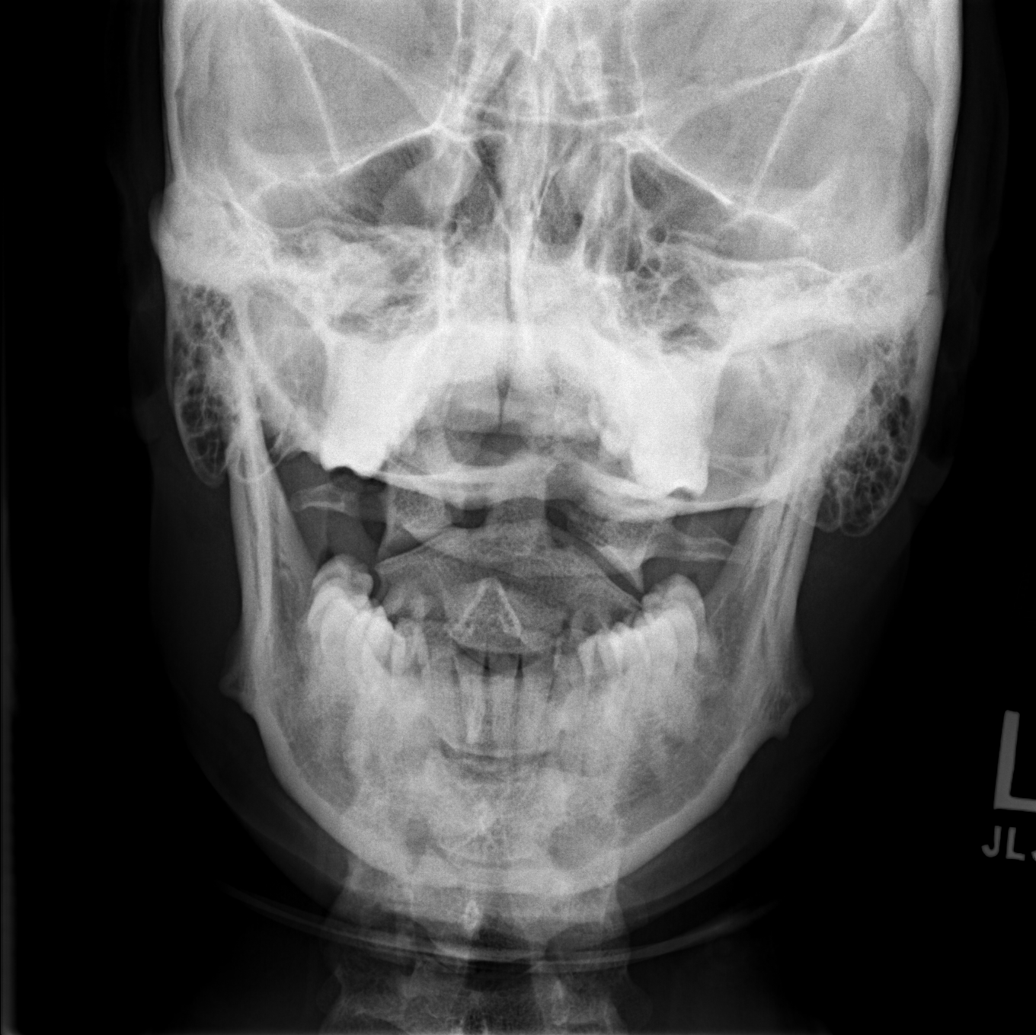

[6 of 6 positions shown; findings below may reference images not displayed]

FINDINGS: There is no evidence of cervical spine fracture or prevertebral soft
tissue swelling. Alignment is normal. No other significant bone
abnormalities are identified.
IMPRESSION: Negative cervical spine radiographs.

## 2019-08-21 IMAGING — CR DG LUMBAR SPINE COMPLETE 4+V
5 series · 5 of 5 positions shown · non-contrast
Comparison: None.

CLINICAL DATA: Pain after trauma

EXAM:
LUMBAR SPINE - COMPLETE 4+ VIEW

[t lumbar spine ap]
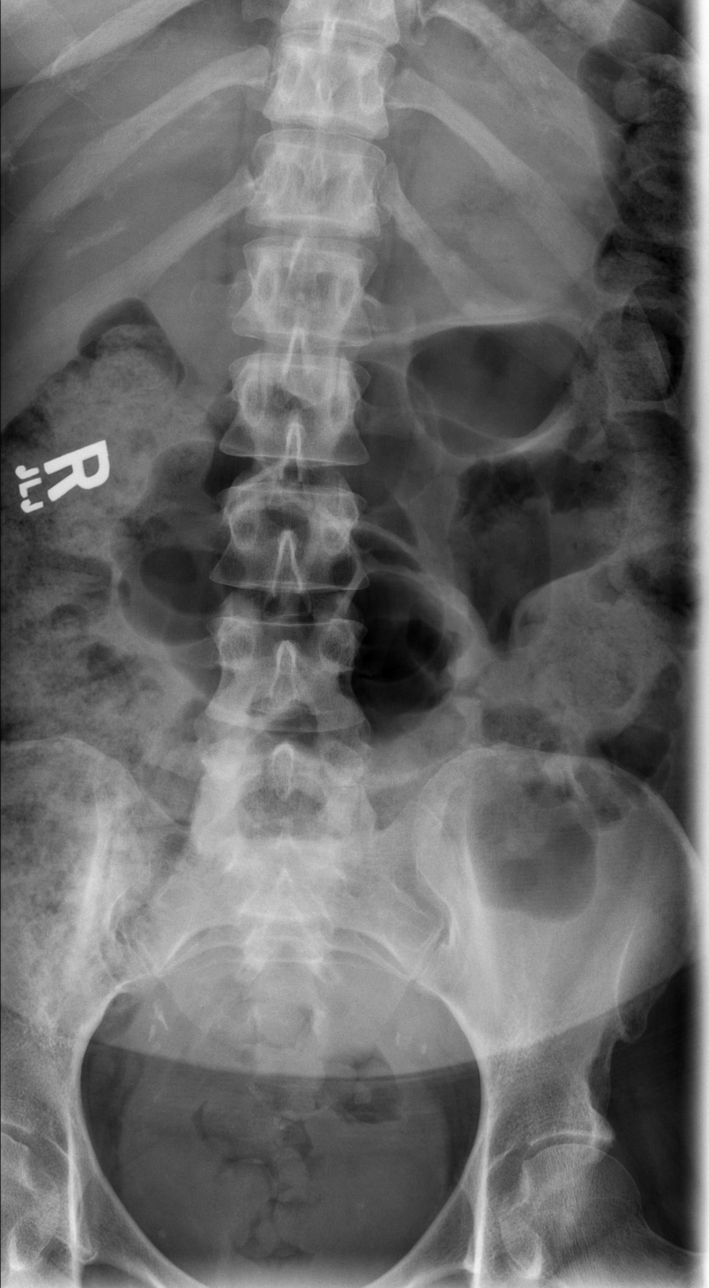

[t lumbar spine obl (1 of 2)]
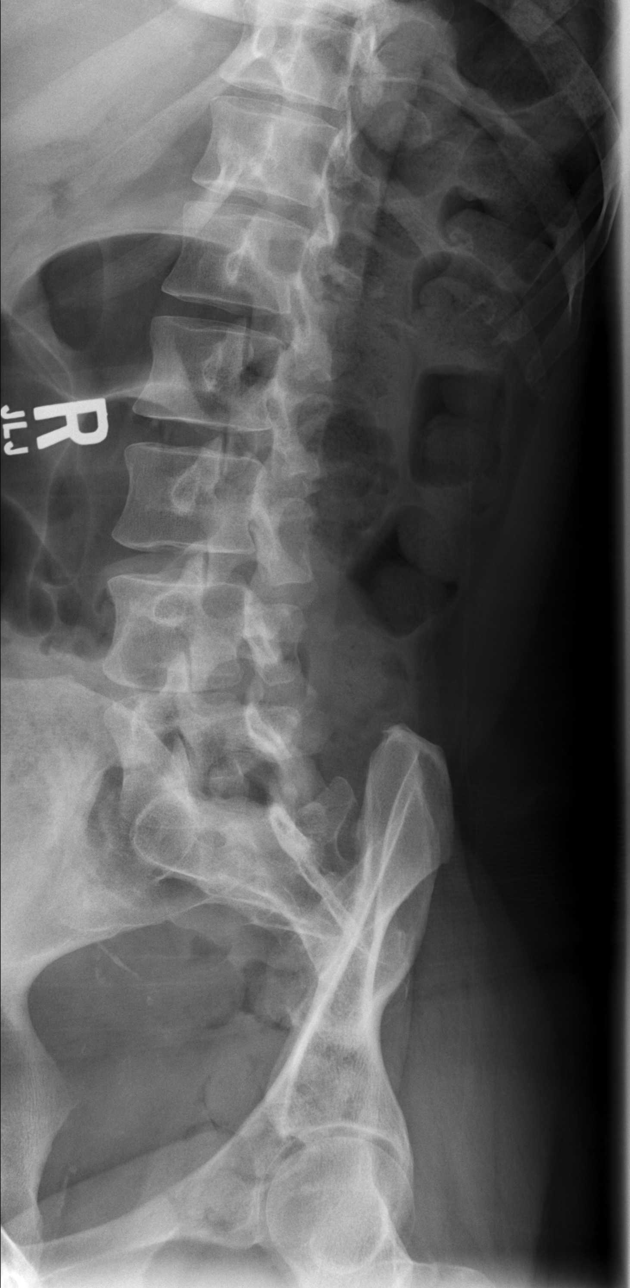

[t lumbar spine obl (2 of 2)]
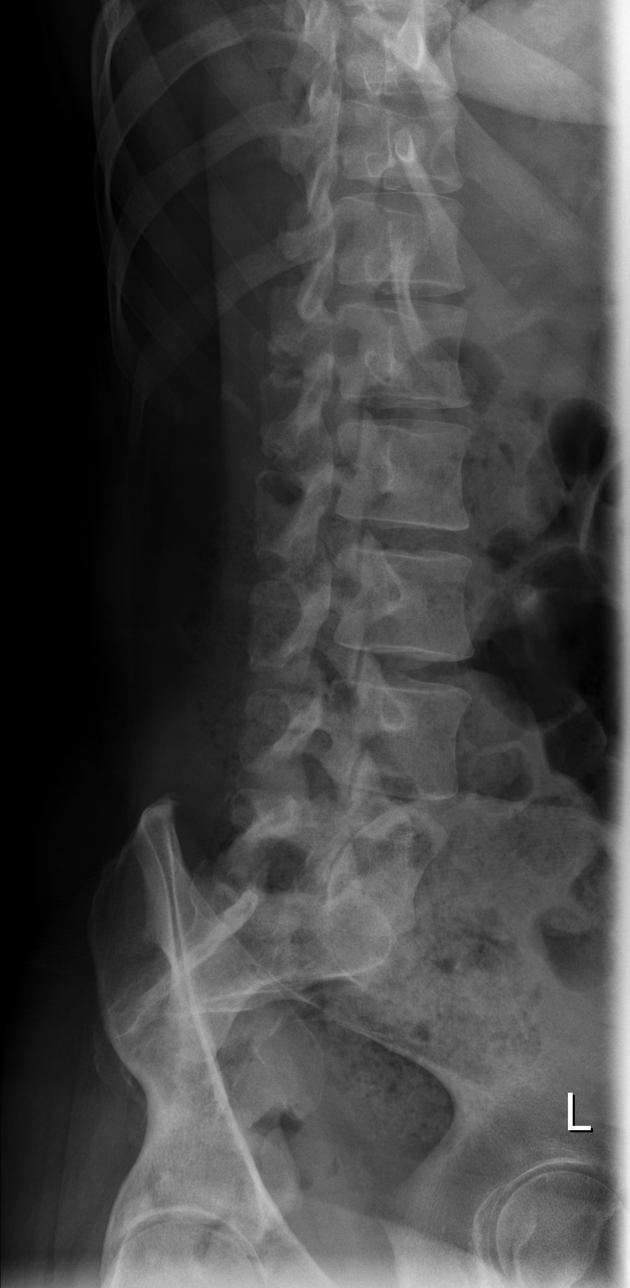

[t lumbar spine lat]
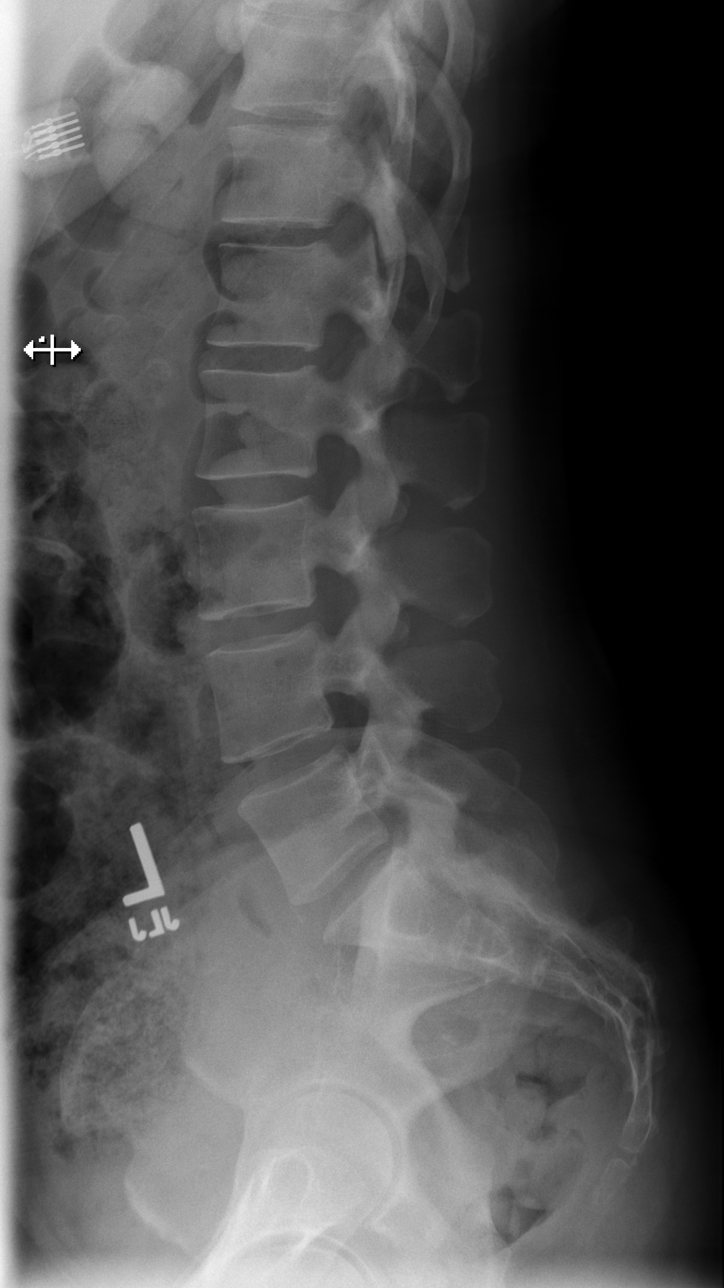

[t lumbar l-5 s-1 spot]
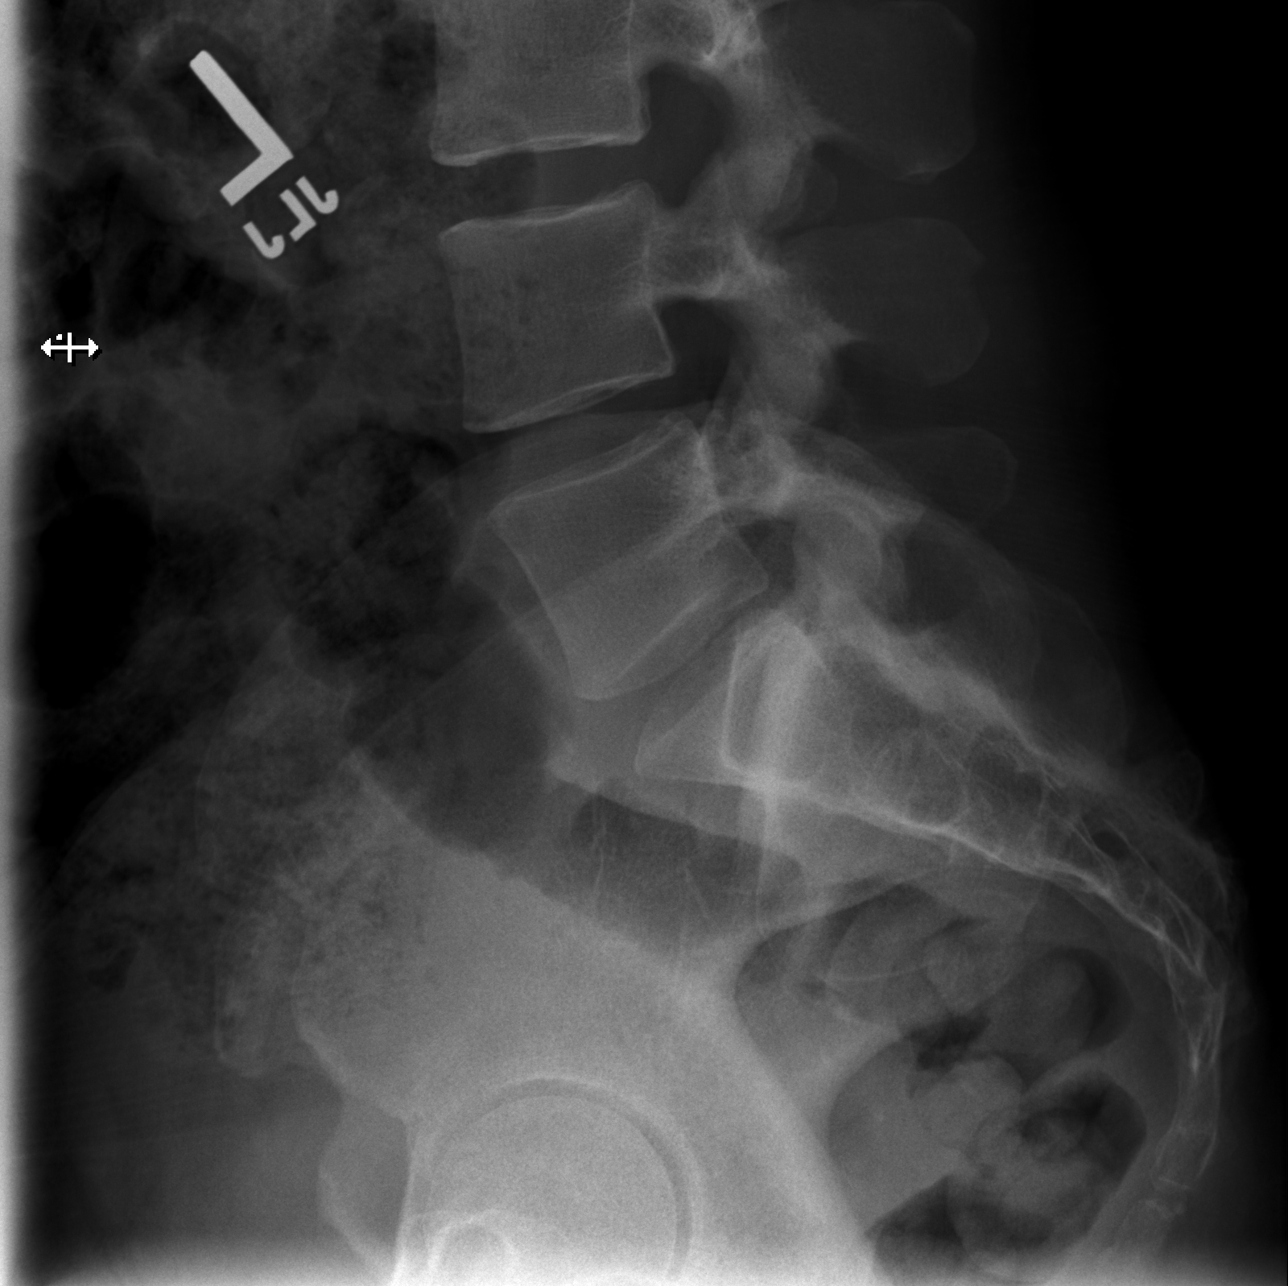

[5 of 5 positions shown; findings below may reference images not displayed]

FINDINGS: There is no evidence of lumbar spine fracture. Alignment is normal.
Intervertebral disc spaces are maintained.
IMPRESSION: Negative.

## 2019-08-23 DIAGNOSIS — E109 Type 1 diabetes mellitus without complications: Secondary | ICD-10-CM | POA: Diagnosis not present

## 2019-08-23 DIAGNOSIS — Z794 Long term (current) use of insulin: Secondary | ICD-10-CM | POA: Diagnosis not present

## 2019-08-23 DIAGNOSIS — E1065 Type 1 diabetes mellitus with hyperglycemia: Secondary | ICD-10-CM | POA: Diagnosis not present

## 2019-09-19 DIAGNOSIS — M0609 Rheumatoid arthritis without rheumatoid factor, multiple sites: Secondary | ICD-10-CM | POA: Diagnosis not present

## 2019-09-19 DIAGNOSIS — M1990 Unspecified osteoarthritis, unspecified site: Secondary | ICD-10-CM | POA: Diagnosis not present

## 2019-09-19 DIAGNOSIS — Z79899 Other long term (current) drug therapy: Secondary | ICD-10-CM | POA: Diagnosis not present

## 2019-10-07 DIAGNOSIS — Z794 Long term (current) use of insulin: Secondary | ICD-10-CM | POA: Diagnosis not present

## 2019-10-07 DIAGNOSIS — E1065 Type 1 diabetes mellitus with hyperglycemia: Secondary | ICD-10-CM | POA: Diagnosis not present

## 2019-10-07 DIAGNOSIS — E109 Type 1 diabetes mellitus without complications: Secondary | ICD-10-CM | POA: Diagnosis not present

## 2019-10-11 DIAGNOSIS — E1065 Type 1 diabetes mellitus with hyperglycemia: Secondary | ICD-10-CM | POA: Diagnosis not present

## 2019-10-14 ENCOUNTER — Ambulatory Visit: Payer: BC Managed Care – PPO | Attending: Internal Medicine

## 2019-10-14 ENCOUNTER — Ambulatory Visit: Payer: Medicare Other

## 2019-10-14 DIAGNOSIS — Z23 Encounter for immunization: Secondary | ICD-10-CM

## 2019-10-14 NOTE — Progress Notes (Signed)
   Covid-19 Vaccination Clinic  Name:  Tiffany [name]    MRN: [id number] DOB: April 22, 1971  10/14/2019  [name] was observed post Covid-19 immunization for 15 minutes without incident. She was provided with Vaccine Information Sheet and instruction to access the V-Safe system.   [name] was instructed to call 911 with any severe reactions post vaccine: Marland Kitchen Difficulty breathing  . Swelling of face and throat  . A fast heartbeat  . A bad rash all over body  . Dizziness and weakness   Immunizations Administered    Name Date Dose VIS Date Route   Moderna COVID-19 Vaccine 10/14/2019 11:08 AM 0.5 mL 06/28/2019 Intramuscular   Manufacturer: Moderna   Lot: [id number]   [id number]

## 2019-11-15 ENCOUNTER — Ambulatory Visit: Payer: BC Managed Care – PPO | Attending: Internal Medicine

## 2019-11-15 DIAGNOSIS — Z23 Encounter for immunization: Secondary | ICD-10-CM

## 2019-11-15 NOTE — Progress Notes (Signed)
   Covid-19 Vaccination Clinic  Name:  Tiffany [name]    MRN: [id number] DOB: 06/22/1971  11/15/2019  [name] was observed post Covid-19 immunization for 15 minutes without incident. She was provided with Vaccine Information Sheet and instruction to access the V-Safe system.   [name] was instructed to call 911 with any severe reactions post vaccine: Marland Kitchen Difficulty breathing  . Swelling of face and throat  . A fast heartbeat  . A bad rash all over body  . Dizziness and weakness   Immunizations Administered    Name Date Dose VIS Date Route   Moderna COVID-19 Vaccine 11/15/2019  9:51 AM 0.5 mL 06/2019 Intramuscular   Manufacturer: Moderna   Lot: [id number]   [id number]

## 2019-12-15 ENCOUNTER — Ambulatory Visit
Admission: RE | Admit: 2019-12-15 | Discharge: 2019-12-15 | Disposition: A | Discharge status: Home / Self Care | Payer: BC Managed Care – PPO | Source: Ambulatory Visit | Attending: Physician Assistant | Admitting: Physician Assistant

## 2019-12-15 ENCOUNTER — Other Ambulatory Visit: Payer: Self-pay | Admitting: Physician Assistant

## 2019-12-15 DIAGNOSIS — Z794 Long term (current) use of insulin: Secondary | ICD-10-CM | POA: Diagnosis not present

## 2019-12-15 DIAGNOSIS — K5900 Constipation, unspecified: Secondary | ICD-10-CM | POA: Diagnosis not present

## 2019-12-15 DIAGNOSIS — E1065 Type 1 diabetes mellitus with hyperglycemia: Secondary | ICD-10-CM | POA: Diagnosis not present

## 2019-12-15 DIAGNOSIS — E109 Type 1 diabetes mellitus without complications: Secondary | ICD-10-CM | POA: Diagnosis not present

## 2019-12-20 DIAGNOSIS — M0609 Rheumatoid arthritis without rheumatoid factor, multiple sites: Secondary | ICD-10-CM | POA: Diagnosis not present

## 2019-12-20 DIAGNOSIS — Z79899 Other long term (current) drug therapy: Secondary | ICD-10-CM | POA: Diagnosis not present

## 2019-12-28 DIAGNOSIS — Z794 Long term (current) use of insulin: Secondary | ICD-10-CM | POA: Diagnosis not present

## 2019-12-28 DIAGNOSIS — E1065 Type 1 diabetes mellitus with hyperglycemia: Secondary | ICD-10-CM | POA: Diagnosis not present

## 2019-12-28 DIAGNOSIS — E1043 Type 1 diabetes mellitus with diabetic autonomic (poly)neuropathy: Secondary | ICD-10-CM | POA: Diagnosis not present

## 2019-12-28 DIAGNOSIS — K3184 Gastroparesis: Secondary | ICD-10-CM | POA: Diagnosis not present

## 2020-01-06 DIAGNOSIS — R194 Change in bowel habit: Secondary | ICD-10-CM | POA: Diagnosis not present

## 2020-01-06 DIAGNOSIS — K3184 Gastroparesis: Secondary | ICD-10-CM | POA: Diagnosis not present

## 2020-01-06 DIAGNOSIS — E119 Type 2 diabetes mellitus without complications: Secondary | ICD-10-CM | POA: Diagnosis not present

## 2020-01-06 DIAGNOSIS — K5901 Slow transit constipation: Secondary | ICD-10-CM | POA: Diagnosis not present

## 2020-01-13 DIAGNOSIS — E109 Type 1 diabetes mellitus without complications: Secondary | ICD-10-CM | POA: Diagnosis not present

## 2020-01-13 DIAGNOSIS — E1065 Type 1 diabetes mellitus with hyperglycemia: Secondary | ICD-10-CM | POA: Diagnosis not present

## 2020-01-13 DIAGNOSIS — Z794 Long term (current) use of insulin: Secondary | ICD-10-CM | POA: Diagnosis not present

## 2020-02-09 DIAGNOSIS — R8290 Unspecified abnormal findings in urine: Secondary | ICD-10-CM | POA: Diagnosis not present

## 2020-02-09 DIAGNOSIS — R300 Dysuria: Secondary | ICD-10-CM | POA: Diagnosis not present

## 2020-02-09 DIAGNOSIS — R350 Frequency of micturition: Secondary | ICD-10-CM | POA: Diagnosis not present

## 2020-02-28 DIAGNOSIS — N390 Urinary tract infection, site not specified: Secondary | ICD-10-CM | POA: Diagnosis not present

## 2020-02-28 DIAGNOSIS — M549 Dorsalgia, unspecified: Secondary | ICD-10-CM | POA: Diagnosis not present

## 2020-03-09 DIAGNOSIS — M2550 Pain in unspecified joint: Secondary | ICD-10-CM | POA: Diagnosis not present

## 2020-03-09 DIAGNOSIS — Z79899 Other long term (current) drug therapy: Secondary | ICD-10-CM | POA: Diagnosis not present

## 2020-03-09 DIAGNOSIS — M0609 Rheumatoid arthritis without rheumatoid factor, multiple sites: Secondary | ICD-10-CM | POA: Diagnosis not present

## 2020-03-09 DIAGNOSIS — M1990 Unspecified osteoarthritis, unspecified site: Secondary | ICD-10-CM | POA: Diagnosis not present

## 2020-03-16 DIAGNOSIS — E109 Type 1 diabetes mellitus without complications: Secondary | ICD-10-CM | POA: Diagnosis not present

## 2020-03-16 DIAGNOSIS — Z794 Long term (current) use of insulin: Secondary | ICD-10-CM | POA: Diagnosis not present

## 2020-03-16 DIAGNOSIS — E1065 Type 1 diabetes mellitus with hyperglycemia: Secondary | ICD-10-CM | POA: Diagnosis not present

## 2020-03-21 DIAGNOSIS — R519 Headache, unspecified: Secondary | ICD-10-CM | POA: Diagnosis not present

## 2020-03-23 DIAGNOSIS — H35013 Changes in retinal vascular appearance, bilateral: Secondary | ICD-10-CM | POA: Diagnosis not present

## 2020-03-23 DIAGNOSIS — H35363 Drusen (degenerative) of macula, bilateral: Secondary | ICD-10-CM | POA: Diagnosis not present

## 2020-03-23 DIAGNOSIS — H35033 Hypertensive retinopathy, bilateral: Secondary | ICD-10-CM | POA: Diagnosis not present

## 2020-03-23 DIAGNOSIS — E113393 Type 2 diabetes mellitus with moderate nonproliferative diabetic retinopathy without macular edema, bilateral: Secondary | ICD-10-CM | POA: Diagnosis not present

## 2020-03-27 DIAGNOSIS — E103512 Type 1 diabetes mellitus with proliferative diabetic retinopathy with macular edema, left eye: Secondary | ICD-10-CM | POA: Diagnosis not present

## 2020-03-27 DIAGNOSIS — H04123 Dry eye syndrome of bilateral lacrimal glands: Secondary | ICD-10-CM | POA: Diagnosis not present

## 2020-03-27 DIAGNOSIS — B0229 Other postherpetic nervous system involvement: Secondary | ICD-10-CM | POA: Diagnosis not present

## 2020-03-27 DIAGNOSIS — Z8619 Personal history of other infectious and parasitic diseases: Secondary | ICD-10-CM | POA: Diagnosis not present

## 2020-03-27 DIAGNOSIS — H0100B Unspecified blepharitis left eye, upper and lower eyelids: Secondary | ICD-10-CM | POA: Diagnosis not present

## 2020-03-27 DIAGNOSIS — E1065 Type 1 diabetes mellitus with hyperglycemia: Secondary | ICD-10-CM | POA: Diagnosis not present

## 2020-03-27 DIAGNOSIS — H5712 Ocular pain, left eye: Secondary | ICD-10-CM | POA: Diagnosis not present

## 2020-03-27 DIAGNOSIS — H2513 Age-related nuclear cataract, bilateral: Secondary | ICD-10-CM | POA: Diagnosis not present

## 2020-03-27 DIAGNOSIS — H0288A Meibomian gland dysfunction right eye, upper and lower eyelids: Secondary | ICD-10-CM | POA: Diagnosis not present

## 2020-03-27 DIAGNOSIS — H0100A Unspecified blepharitis right eye, upper and lower eyelids: Secondary | ICD-10-CM | POA: Diagnosis not present

## 2020-03-27 DIAGNOSIS — E103591 Type 1 diabetes mellitus with proliferative diabetic retinopathy without macular edema, right eye: Secondary | ICD-10-CM | POA: Diagnosis not present

## 2020-03-27 DIAGNOSIS — H0288B Meibomian gland dysfunction left eye, upper and lower eyelids: Secondary | ICD-10-CM | POA: Diagnosis not present

## 2020-03-30 ENCOUNTER — Encounter (INDEPENDENT_AMBULATORY_CARE_PROVIDER_SITE_OTHER): Payer: Self-pay

## 2020-03-30 ENCOUNTER — Encounter (INDEPENDENT_AMBULATORY_CARE_PROVIDER_SITE_OTHER): Payer: Self-pay | Admitting: Ophthalmology

## 2020-03-30 DIAGNOSIS — E1043 Type 1 diabetes mellitus with diabetic autonomic (poly)neuropathy: Secondary | ICD-10-CM | POA: Diagnosis not present

## 2020-03-30 DIAGNOSIS — K3184 Gastroparesis: Secondary | ICD-10-CM | POA: Diagnosis not present

## 2020-03-30 DIAGNOSIS — Z9641 Presence of insulin pump (external) (internal): Secondary | ICD-10-CM | POA: Diagnosis not present

## 2020-03-30 DIAGNOSIS — E1065 Type 1 diabetes mellitus with hyperglycemia: Secondary | ICD-10-CM | POA: Diagnosis not present

## 2020-04-05 DIAGNOSIS — B0229 Other postherpetic nervous system involvement: Secondary | ICD-10-CM | POA: Diagnosis not present

## 2020-04-05 DIAGNOSIS — H5712 Ocular pain, left eye: Secondary | ICD-10-CM | POA: Diagnosis not present

## 2020-04-09 DIAGNOSIS — E1065 Type 1 diabetes mellitus with hyperglycemia: Secondary | ICD-10-CM | POA: Diagnosis not present

## 2020-04-09 DIAGNOSIS — Z79899 Other long term (current) drug therapy: Secondary | ICD-10-CM | POA: Diagnosis not present

## 2020-04-09 DIAGNOSIS — M0609 Rheumatoid arthritis without rheumatoid factor, multiple sites: Secondary | ICD-10-CM | POA: Diagnosis not present

## 2020-04-09 DIAGNOSIS — E103512 Type 1 diabetes mellitus with proliferative diabetic retinopathy with macular edema, left eye: Secondary | ICD-10-CM | POA: Diagnosis not present

## 2020-04-16 DIAGNOSIS — E109 Type 1 diabetes mellitus without complications: Secondary | ICD-10-CM | POA: Diagnosis not present

## 2020-04-16 DIAGNOSIS — Z794 Long term (current) use of insulin: Secondary | ICD-10-CM | POA: Diagnosis not present

## 2020-04-16 DIAGNOSIS — E1065 Type 1 diabetes mellitus with hyperglycemia: Secondary | ICD-10-CM | POA: Diagnosis not present

## 2020-05-02 DIAGNOSIS — Z01419 Encounter for gynecological examination (general) (routine) without abnormal findings: Secondary | ICD-10-CM | POA: Diagnosis not present

## 2020-05-02 DIAGNOSIS — Z124 Encounter for screening for malignant neoplasm of cervix: Secondary | ICD-10-CM | POA: Diagnosis not present

## 2020-05-02 DIAGNOSIS — N920 Excessive and frequent menstruation with regular cycle: Secondary | ICD-10-CM | POA: Diagnosis not present

## 2020-06-04 DIAGNOSIS — N920 Excessive and frequent menstruation with regular cycle: Secondary | ICD-10-CM | POA: Diagnosis not present

## 2020-06-04 DIAGNOSIS — N9489 Other specified conditions associated with female genital organs and menstrual cycle: Secondary | ICD-10-CM | POA: Diagnosis not present

## 2020-06-04 DIAGNOSIS — M549 Dorsalgia, unspecified: Secondary | ICD-10-CM | POA: Diagnosis not present

## 2020-06-05 ENCOUNTER — Ambulatory Visit (INDEPENDENT_AMBULATORY_CARE_PROVIDER_SITE_OTHER): Payer: BC Managed Care – PPO | Admitting: Family Medicine

## 2020-06-05 ENCOUNTER — Encounter: Payer: Self-pay | Admitting: Family Medicine

## 2020-06-05 ENCOUNTER — Other Ambulatory Visit: Payer: Self-pay

## 2020-06-05 VITALS — BP 122/82 | HR 96 | Temp 98.6°F | Ht 66.25 in | Wt 149.5 lb

## 2020-06-05 DIAGNOSIS — B002 Herpesviral gingivostomatitis and pharyngotonsillitis: Secondary | ICD-10-CM | POA: Diagnosis not present

## 2020-06-05 DIAGNOSIS — E1065 Type 1 diabetes mellitus with hyperglycemia: Secondary | ICD-10-CM

## 2020-06-05 DIAGNOSIS — D219 Benign neoplasm of connective and other soft tissue, unspecified: Secondary | ICD-10-CM

## 2020-06-05 DIAGNOSIS — N390 Urinary tract infection, site not specified: Secondary | ICD-10-CM | POA: Insufficient documentation

## 2020-06-05 DIAGNOSIS — K5900 Constipation, unspecified: Secondary | ICD-10-CM

## 2020-06-05 DIAGNOSIS — G43909 Migraine, unspecified, not intractable, without status migrainosus: Secondary | ICD-10-CM | POA: Insufficient documentation

## 2020-06-05 DIAGNOSIS — G43109 Migraine with aura, not intractable, without status migrainosus: Secondary | ICD-10-CM | POA: Diagnosis not present

## 2020-06-05 DIAGNOSIS — IMO0002 Reserved for concepts with insufficient information to code with codable children: Secondary | ICD-10-CM

## 2020-06-05 DIAGNOSIS — M069 Rheumatoid arthritis, unspecified: Secondary | ICD-10-CM | POA: Diagnosis not present

## 2020-06-05 HISTORY — DX: Rheumatoid arthritis, unspecified: M06.9

## 2020-06-05 HISTORY — DX: Migraine, unspecified, not intractable, without status migrainosus: G43.909

## 2020-06-05 NOTE — Assessment & Plan Note (Addendum)
Taking methotrexate and has not needed the prednisone as often. Appreciate Rheumatology. Follows with Dr. Gerilyn Nestle

## 2020-06-05 NOTE — Assessment & Plan Note (Signed)
Notes several UTIs per year. Will get records of cultures to tract. Trial of D-Mannose to see if that reduces frequency. She will f/u if she gets symptoms.

## 2020-06-05 NOTE — Patient Instructions (Addendum)
D-Mannose - 2 Grams per day to prevent Urinary Tract infections    How to help depression - without medication.   1) Regular Exercise - walking, jogging, cycling, dancing, strength training --> Yoga has been shown in research to reduce depression and anxiety -- with even just one hour long session per week  2)  Begin a Mindfulness/Meditation practice -- this can take a little as 3 minutes and is helpful for all kinds of mood issues -- You can find resources in books -- Or you can download apps like  ---- Headspace App (which currently has free content called "Weathering the Storm") ---- Calm (which has a few free options)  ---- Insignt Timer ---- Stop, Breathe & Think  # With each of these Apps - you should decline the "start free trial" offer and as you search through the App should be able to access some of their free content. You can also chose to pay for the content if you find one that works well for you.   # Many of them also offer sleep specific content which may help with insomnia  3) Healthy Diet -- Avoid or decrease Caffeine -- Avoid or decrease Alcohol -- Drink plenty of water, have a balanced diet -- Avoid cigarettes and marijuana (as well as other recreational drugs)  4) Consider contacting a professional therapist  -- [facility] is one option. Call [phone] -- Or you can check out [email or web address] -- you can read bios of therapists and see if they accept insurance -- Check with your insurance to see if you have coverage and who may take your insurance

## 2020-06-05 NOTE — Assessment & Plan Note (Signed)
Follows with Dr. Janese Banks. Appreciate endocrine support. Cont insulin pump. Reports last Hgb A1c 8%. Will obtain outside records

## 2020-06-05 NOTE — Assessment & Plan Note (Signed)
Cont Valacyclovir prn for flare ups

## 2020-06-05 NOTE — Assessment & Plan Note (Signed)
Responds well to sumatriptan

## 2020-06-05 NOTE — Progress Notes (Signed)
Subjective:     Tiffany [name] is a 49 y.o. female presenting for Establish Care (no concerns) and Referral (nephrologist )     HPI  #Diabetes Currently taking insulin  Using medications without difficulties: No Hypoglycemic episodes:No  Hyperglycemic episodes:No  Feet problems:No   Last HgbA1c: No results found for: HGBA1C  Diabetes Health Maintenance Due:    Diabetes Health Maintenance Due  Topic Date Due  . HEMOGLOBIN A1C  Never done  . FOOT EXAM  Never done  . OPHTHALMOLOGY EXAM  Never done  . URINE MICROALBUMIN  Never done   Follows with Endocrinology - Dr. Ilona Sorrel  Follows with Rheumatology - Dr. Gerilyn Nestle   #Constipation - suggested an ostomy - has fecal incontinence - has pelvic floor dysfunction  - stool changes from loose to not loose  - has previously tried pelvic floor rehab x 1 year  - GI has encouraged an ostomy   #rheumatoid arthritis - pain and difficulty walking long distances  Review of Systems   Social History   Tobacco Use  Smoking Status Never Smoker  Smokeless Tobacco Never Used        Objective:    BP Readings from Last 3 Encounters:  06/05/20 122/82  01/09/18 138/83  11/10/17 130/82   Wt Readings from Last 3 Encounters:  06/05/20 149 lb 8 oz (67.8 kg)  01/08/18 147 lb (66.7 kg)  11/10/17 147 lb 2 oz (66.7 kg)    BP 122/82   Pulse 96   Temp 98.6 F (37 C) (Temporal)   Ht 5' 6.25" (1.683 m)   Wt 149 lb 8 oz (67.8 kg)   LMP 05/28/2020 (Exact Date)   SpO2 97%   BMI 23.95 kg/m    Physical Exam Constitutional:      General: She is not in acute distress.    Appearance: She is well-developed. She is not diaphoretic.  HENT:     Right Ear: External ear normal.     Left Ear: External ear normal.     Nose: Nose normal.  Eyes:     Conjunctiva/sclera: Conjunctivae normal.  Cardiovascular:     Rate and Rhythm: Normal rate and regular rhythm.     Heart sounds: No murmur heard.   Pulmonary:     Effort:  Pulmonary effort is normal. No respiratory distress.     Breath sounds: Normal breath sounds. No wheezing.  Musculoskeletal:     Cervical back: Neck supple.  Skin:    General: Skin is warm and dry.     Capillary Refill: Capillary refill takes less than 2 seconds.  Neurological:     Mental Status: She is alert. Mental status is at baseline.  Psychiatric:        Mood and Affect: Mood normal.        Behavior: Behavior normal.           Assessment & Plan:   Problem List Items Addressed This Visit      Cardiovascular and Mediastinum   Migraines    Responds well to sumatriptan      Relevant Medications   gabapentin (NEURONTIN) 100 MG capsule   SUMAtriptan (IMITREX) 100 MG tablet     Digestive   Oral herpes simplex infection    Cont Valacyclovir prn for flare ups      Relevant Medications   valACYclovir (VALTREX) 1000 MG tablet     Endocrine   Diabetes mellitus type 1, uncontrolled, insulin dependent ([place]) - Primary  Follows with Dr. Janese Banks. Appreciate endocrine support. Cont insulin pump. Reports last Hgb A1c 8%. Will obtain outside records      Relevant Medications   insulin glargine (LANTUS SOLOSTAR) 100 UNIT/ML Solostar Pen   insulin glulisine (APIDRA) 100 UNIT/ML injection   APIDRA SOLOSTAR 100 UNIT/ML Solostar Pen     Musculoskeletal and Integument   Rheumatoid arthritis (HCC)    Taking methotrexate and has not needed the prednisone as often. Appreciate Rheumatology. Follows with Dr. Gerilyn Nestle      Relevant Medications   methotrexate (RHEUMATREX) 2.5 MG tablet     Genitourinary   Recurrent UTI    Notes several UTIs per year. Will get records of cultures to tract. Trial of D-Mannose to see if that reduces frequency. She will f/u if she gets symptoms.       Relevant Medications   valACYclovir (VALTREX) 1000 MG tablet     Other   Constipation    Complicated hx and difficult to treat with intermittent diarrhea and incontinence. She follows with Dr.  Paulita Fujita with [facility] - will obtain records. Encouraged continued GI support. She has already done pelvic PT w/o improvement      Fibroids    Appreciate GYN support. Sees Dr. Garwin Brothers and planning surgery. Will get records          Return in about 1 year (around 06/05/2021), or if symptoms worsen or fail to improve.  Lesleigh Noe, MD  This visit occurred during the SARS-CoV-2 public health emergency.  Safety protocols were in place, including screening questions prior to the visit, additional usage of staff PPE, and extensive cleaning of exam room while observing appropriate contact time as indicated for disinfecting solutions.

## 2020-06-05 NOTE — Assessment & Plan Note (Signed)
Appreciate GYN support. Sees Dr. Garwin Brothers and planning surgery. Will get records

## 2020-06-05 NOTE — Assessment & Plan Note (Signed)
Complicated hx and difficult to treat with intermittent diarrhea and incontinence. She follows with Dr. Paulita Fujita with [facility] - will obtain records. Encouraged continued GI support. She has already done pelvic PT w/o improvement

## 2020-06-18 DIAGNOSIS — E109 Type 1 diabetes mellitus without complications: Secondary | ICD-10-CM | POA: Diagnosis not present

## 2020-06-18 DIAGNOSIS — Z79899 Other long term (current) drug therapy: Secondary | ICD-10-CM | POA: Diagnosis not present

## 2020-06-18 DIAGNOSIS — E1065 Type 1 diabetes mellitus with hyperglycemia: Secondary | ICD-10-CM | POA: Diagnosis not present

## 2020-06-18 DIAGNOSIS — Z794 Long term (current) use of insulin: Secondary | ICD-10-CM | POA: Diagnosis not present

## 2020-06-18 DIAGNOSIS — M2550 Pain in unspecified joint: Secondary | ICD-10-CM | POA: Diagnosis not present

## 2020-06-18 DIAGNOSIS — M0609 Rheumatoid arthritis without rheumatoid factor, multiple sites: Secondary | ICD-10-CM | POA: Diagnosis not present

## 2020-06-18 DIAGNOSIS — M25511 Pain in right shoulder: Secondary | ICD-10-CM | POA: Diagnosis not present

## 2020-06-25 DIAGNOSIS — M0579 Rheumatoid arthritis with rheumatoid factor of multiple sites without organ or systems involvement: Secondary | ICD-10-CM | POA: Diagnosis not present

## 2020-06-25 DIAGNOSIS — M75101 Unspecified rotator cuff tear or rupture of right shoulder, not specified as traumatic: Secondary | ICD-10-CM | POA: Diagnosis not present

## 2020-06-25 DIAGNOSIS — M25511 Pain in right shoulder: Secondary | ICD-10-CM | POA: Diagnosis not present

## 2020-06-25 DIAGNOSIS — M19011 Primary osteoarthritis, right shoulder: Secondary | ICD-10-CM | POA: Diagnosis not present

## 2020-07-03 DIAGNOSIS — R319 Hematuria, unspecified: Secondary | ICD-10-CM | POA: Diagnosis not present

## 2020-07-03 DIAGNOSIS — M549 Dorsalgia, unspecified: Secondary | ICD-10-CM | POA: Diagnosis not present

## 2020-07-10 DIAGNOSIS — Z9641 Presence of insulin pump (external) (internal): Secondary | ICD-10-CM | POA: Diagnosis not present

## 2020-07-10 DIAGNOSIS — K3184 Gastroparesis: Secondary | ICD-10-CM | POA: Diagnosis not present

## 2020-07-10 DIAGNOSIS — E1043 Type 1 diabetes mellitus with diabetic autonomic (poly)neuropathy: Secondary | ICD-10-CM | POA: Diagnosis not present

## 2020-07-10 DIAGNOSIS — Z794 Long term (current) use of insulin: Secondary | ICD-10-CM | POA: Diagnosis not present

## 2020-07-10 DIAGNOSIS — E1065 Type 1 diabetes mellitus with hyperglycemia: Secondary | ICD-10-CM | POA: Diagnosis not present

## 2020-07-10 DIAGNOSIS — E109 Type 1 diabetes mellitus without complications: Secondary | ICD-10-CM | POA: Diagnosis not present

## 2020-08-02 ENCOUNTER — Encounter: Payer: Self-pay | Admitting: Family Medicine

## 2020-08-02 DIAGNOSIS — M75101 Unspecified rotator cuff tear or rupture of right shoulder, not specified as traumatic: Secondary | ICD-10-CM | POA: Diagnosis not present

## 2020-08-02 DIAGNOSIS — M5412 Radiculopathy, cervical region: Secondary | ICD-10-CM | POA: Diagnosis not present

## 2020-08-03 DIAGNOSIS — M50223 Other cervical disc displacement at C6-C7 level: Secondary | ICD-10-CM | POA: Diagnosis not present

## 2020-08-03 DIAGNOSIS — M5412 Radiculopathy, cervical region: Secondary | ICD-10-CM | POA: Diagnosis not present

## 2020-08-03 DIAGNOSIS — M4802 Spinal stenosis, cervical region: Secondary | ICD-10-CM | POA: Diagnosis not present

## 2020-08-03 DIAGNOSIS — M25511 Pain in right shoulder: Secondary | ICD-10-CM | POA: Diagnosis not present

## 2020-08-03 DIAGNOSIS — M50221 Other cervical disc displacement at C4-C5 level: Secondary | ICD-10-CM | POA: Diagnosis not present

## 2020-08-03 DIAGNOSIS — M75101 Unspecified rotator cuff tear or rupture of right shoulder, not specified as traumatic: Secondary | ICD-10-CM | POA: Diagnosis not present

## 2020-08-10 ENCOUNTER — Telehealth: Payer: Self-pay | Admitting: Family Medicine

## 2020-08-10 NOTE — Telephone Encounter (Signed)
Over the counter supplement would be fine. Can check labs in the future if she doesn't feel that helps

## 2020-08-10 NOTE — Telephone Encounter (Signed)
Pt called in wanted to know if she can get an iron pill to help due to she is on her cycle and can tell the difference

## 2020-08-10 NOTE — Telephone Encounter (Signed)
It doesn't look like pt has ever been on an iron rx according to her med history. Please advise if you would like to send one in or recommend OTC iron supplement.

## 2020-08-10 NOTE — Telephone Encounter (Signed)
The dose over the counter of 325 mg is the same as the prescription available.

## 2020-08-10 NOTE — Telephone Encounter (Signed)
Relayed Dr. Verda Cumins message to pt and pt states she will go to [facility] now and see how much the OTC iron will cost.

## 2020-08-10 NOTE — Telephone Encounter (Signed)
Pt states that due to her anemia, her previous PCP at [facility] had sent in a high dose iron supplement but she never had it filled. I can't find that in her hx. Please advise.

## 2020-08-15 DIAGNOSIS — M5412 Radiculopathy, cervical region: Secondary | ICD-10-CM | POA: Diagnosis not present

## 2020-08-15 DIAGNOSIS — M75101 Unspecified rotator cuff tear or rupture of right shoulder, not specified as traumatic: Secondary | ICD-10-CM | POA: Diagnosis not present

## 2020-08-16 DIAGNOSIS — M0579 Rheumatoid arthritis with rheumatoid factor of multiple sites without organ or systems involvement: Secondary | ICD-10-CM | POA: Diagnosis not present

## 2020-08-16 DIAGNOSIS — M7581 Other shoulder lesions, right shoulder: Secondary | ICD-10-CM | POA: Diagnosis not present

## 2020-08-16 DIAGNOSIS — M7521 Bicipital tendinitis, right shoulder: Secondary | ICD-10-CM | POA: Diagnosis not present

## 2020-08-16 DIAGNOSIS — M4802 Spinal stenosis, cervical region: Secondary | ICD-10-CM | POA: Diagnosis not present

## 2020-08-16 DIAGNOSIS — M19011 Primary osteoarthritis, right shoulder: Secondary | ICD-10-CM | POA: Diagnosis not present

## 2020-08-22 ENCOUNTER — Other Ambulatory Visit: Payer: Self-pay

## 2020-08-22 ENCOUNTER — Ambulatory Visit (INDEPENDENT_AMBULATORY_CARE_PROVIDER_SITE_OTHER): Payer: BC Managed Care – PPO | Admitting: Family Medicine

## 2020-08-22 VITALS — BP 130/82 | HR 97 | Temp 97.5°F | Ht 66.0 in | Wt 148.8 lb

## 2020-08-22 DIAGNOSIS — Z114 Encounter for screening for human immunodeficiency virus [HIV]: Secondary | ICD-10-CM

## 2020-08-22 DIAGNOSIS — Z8619 Personal history of other infectious and parasitic diseases: Secondary | ICD-10-CM | POA: Diagnosis not present

## 2020-08-22 DIAGNOSIS — D219 Benign neoplasm of connective and other soft tissue, unspecified: Secondary | ICD-10-CM

## 2020-08-22 DIAGNOSIS — E1065 Type 1 diabetes mellitus with hyperglycemia: Secondary | ICD-10-CM

## 2020-08-22 DIAGNOSIS — B002 Herpesviral gingivostomatitis and pharyngotonsillitis: Secondary | ICD-10-CM | POA: Diagnosis not present

## 2020-08-22 DIAGNOSIS — R0982 Postnasal drip: Secondary | ICD-10-CM | POA: Diagnosis not present

## 2020-08-22 DIAGNOSIS — IMO0002 Reserved for concepts with insufficient information to code with codable children: Secondary | ICD-10-CM

## 2020-08-22 DIAGNOSIS — M069 Rheumatoid arthritis, unspecified: Secondary | ICD-10-CM

## 2020-08-22 DIAGNOSIS — Z1159 Encounter for screening for other viral diseases: Secondary | ICD-10-CM | POA: Diagnosis not present

## 2020-08-22 DIAGNOSIS — R6889 Other general symptoms and signs: Secondary | ICD-10-CM | POA: Diagnosis not present

## 2020-08-22 DIAGNOSIS — D75A Glucose-6-phosphate dehydrogenase (G6PD) deficiency without anemia: Secondary | ICD-10-CM | POA: Diagnosis not present

## 2020-08-22 LAB — LIPID PANEL
Cholesterol: 222 mg/dL — ABNORMAL HIGH (ref 0–200)
HDL: 71 mg/dL (ref >39.00)
LDL Cholesterol: 134 mg/dL — ABNORMAL HIGH (ref 0–99)
NonHDL: 151.04
Total CHOL/HDL Ratio: 3
Triglycerides: 83 mg/dL (ref 0.0–149.0)
VLDL: 16.6 mg/dL (ref 0.0–40.0)

## 2020-08-22 LAB — CBC
HCT: 40.1 % (ref 36.0–46.0)
Hemoglobin: 13 g/dL (ref 12.0–15.0)
MCHC: 32.5 g/dL (ref 30.0–36.0)
MCV: 91.2 fl (ref 78.0–100.0)
Platelets: 380 10*3/uL (ref 150.0–400.0)
RBC: 4.39 Mil/uL (ref 3.87–5.11)
RDW: 13.8 % (ref 11.5–15.5)
WBC: 8.7 10*3/uL (ref 4.0–10.5)

## 2020-08-22 LAB — COMPREHENSIVE METABOLIC PANEL
ALT: 18 U/L (ref 0–35)
AST: 15 U/L (ref 0–37)
Albumin: 4 g/dL (ref 3.5–5.2)
Alkaline Phosphatase: 91 U/L (ref 39–117)
BUN: 20 mg/dL (ref 6–23)
CO2: 32 mEq/L (ref 19–32)
Calcium: 9.4 mg/dL (ref 8.4–10.5)
Chloride: 99 mEq/L (ref 96–112)
Creatinine, Ser: 0.65 mg/dL (ref 0.40–1.20)
GFR: 102.86 mL/min (ref >60.00)
Glucose, Bld: 187 mg/dL — ABNORMAL HIGH (ref 70–99)
Potassium: 4.2 mEq/L (ref 3.5–5.1)
Sodium: 137 mEq/L (ref 135–145)
Total Bilirubin: 0.6 mg/dL (ref 0.2–1.2)
Total Protein: 7.1 g/dL (ref 6.0–8.3)

## 2020-08-22 LAB — HEMOGLOBIN A1C: Hgb A1c MFr Bld: 8.6 % — ABNORMAL HIGH (ref 4.6–6.5)

## 2020-08-22 LAB — FERRITIN: Ferritin: 14.6 ng/mL (ref 10.0–291.0)

## 2020-08-22 LAB — TSH: TSH: 3.2 u[IU]/mL (ref 0.35–4.50)

## 2020-08-22 MED ORDER — LORATADINE 10 MG PO TABS: 10.0000 mg | ORAL_TABLET | Freq: Every day | ORAL | 3 refills | Status: DC

## 2020-08-22 MED ORDER — FERROUS SULFATE 325 (65 FE) MG PO TABS: 325.0000 mg | ORAL_TABLET | Freq: Every day | ORAL | 1 refills | Status: DC

## 2020-08-22 NOTE — Assessment & Plan Note (Signed)
Cont valacyclovir for suppression

## 2020-08-22 NOTE — Assessment & Plan Note (Signed)
Currently on suppressive valacyclovir due to recurrent shingles. Appreciate rheum support and given hx of oral herpes discussed this is likely helping suppress that as well. Cont valacyclovir and advised discussing shingles shot with rheumatologist as well.

## 2020-08-22 NOTE — Assessment & Plan Note (Signed)
Reports good control. Follows with endocrinology. Will recheck A1c as she reports it has been a while. Cont insulin and atorvastatin.

## 2020-08-22 NOTE — Patient Instructions (Addendum)
#  Allergies - try switching allergy pill - try zyrtec, allegra  - try flonase daily for 1 week to see if that improves - Can switch to nasocort or allergy sinus spray if not improved   Check with the rheumatology -- about the shingles shot

## 2020-08-22 NOTE — Assessment & Plan Note (Signed)
Pt taking methotrexate. Reviewed shingrix guidelines that she could consider the shot, however, may not have immune response. Advised discussing further with rheumatologist. Cont methotrexate 12.5 mg weekly

## 2020-08-22 NOTE — Progress Notes (Signed)
Subjective:     Tiffany [name] is a 50 y.o. female presenting for discuss medications (And wants lab work )     HPI  #Iron deficiency - concerned about this - heavy menses - working with OB/GYN - planning surgery  #recurrent shingles - has not had the shingles shot - is taking valacyclor - on methotrexate  #allergies - taking claritin and flonase for treatment - the mucus is so bad with allergies  - getting post nasal drip  Due for eye surgery Has not followed up with her specialist  Review of Systems   Social History   Tobacco Use  Smoking Status Never Smoker  Smokeless Tobacco Never Used        Objective:    BP Readings from Last 3 Encounters:  08/22/20 130/82  06/05/20 122/82  01/09/18 138/83   Wt Readings from Last 3 Encounters:  08/22/20 148 lb 12 oz (67.5 kg)  06/05/20 149 lb 8 oz (67.8 kg)  01/08/18 147 lb (66.7 kg)    BP 130/82   Pulse 97   Temp (!) 97.5 F (36.4 C) (Temporal)   Ht 5\' 6"  (1.676 m)   Wt 148 lb 12 oz (67.5 kg)   LMP 08/08/2020 (Within Days)   SpO2 97%   BMI 24.01 kg/m    Physical Exam Constitutional:      General: She is not in acute distress.    Appearance: She is well-developed. She is not diaphoretic.  HENT:     Head:     Comments: Face mask and face shield    Right Ear: External ear normal.     Left Ear: External ear normal.  Cardiovascular:     Rate and Rhythm: Normal rate.  Pulmonary:     Effort: Pulmonary effort is normal.  Musculoskeletal:     Cervical back: Neck supple.  Skin:    General: Skin is warm and dry.     Capillary Refill: Capillary refill takes less than 2 seconds.  Neurological:     Mental Status: She is alert. Mental status is at baseline.  Psychiatric:        Mood and Affect: Mood normal.        Behavior: Behavior normal.           Assessment & Plan:   Problem List Items Addressed This Visit      Digestive   Oral herpes simplex infection - Primary    Cont valacyclovir  for suppression        Endocrine   Diabetes mellitus type 1, uncontrolled, insulin dependent (HCC)    Reports good control. Follows with endocrinology. Will recheck A1c as she reports it has been a while. Cont insulin and atorvastatin.       Relevant Orders   Comprehensive metabolic panel   Lipid panel   Hemoglobin A1c     Musculoskeletal and Integument   Rheumatoid arthritis ([place])    Pt taking methotrexate. Reviewed shingrix guidelines that she could consider the shot, however, may not have immune response. Advised discussing further with rheumatologist. Cont methotrexate 12.5 mg weekly      Relevant Medications   diclofenac (VOLTAREN) 50 MG EC tablet   meloxicam (MOBIC) 7.5 MG tablet     Other   G6PD deficiency   Relevant Orders   CBC   Fibroids    Hx of iron deficiency and heavy bleeding. Working with GYN and anticipating surgery. Will check labs as she is concerned about worsening anemia.  Relevant Orders   CBC   Ferritin   History of shingles    Currently on suppressive valacyclovir due to recurrent shingles. Appreciate rheum support and given hx of oral herpes discussed this is likely helping suppress that as well. Cont valacyclovir and advised discussing shingles shot with rheumatologist as well.       Post-nasal drip    Consider trial of alternative allergy medication. Start daily flonase.        Other Visit Diagnoses    Cold intolerance       Relevant Orders   TSH   Need for hepatitis C screening test       Relevant Orders   Hepatitis C antibody   Encounter for screening for HIV       Relevant Orders   HIV Antibody (routine testing w rflx)       Return in about 1 year (around 08/22/2021), or if symptoms worsen or fail to improve.  Lesleigh Noe, MD  This visit occurred during the SARS-CoV-2 public health emergency.  Safety protocols were in place, including screening questions prior to the visit, additional usage of staff PPE, and extensive  cleaning of exam room while observing appropriate contact time as indicated for disinfecting solutions.

## 2020-08-22 NOTE — Assessment & Plan Note (Signed)
Consider trial of alternative allergy medication. Start daily flonase.

## 2020-08-22 NOTE — Assessment & Plan Note (Signed)
Hx of iron deficiency and heavy bleeding. Working with GYN and anticipating surgery. Will check labs as she is concerned about worsening anemia.

## 2020-08-23 LAB — HEPATITIS C ANTIBODY
Hepatitis C Ab: NONREACTIVE
SIGNAL TO CUT-OFF: 0.01 (ref <1.00)

## 2020-08-23 LAB — HIV ANTIBODY (ROUTINE TESTING W REFLEX): HIV 1&2 Ab, 4th Generation: NONREACTIVE

## 2020-09-18 ENCOUNTER — Encounter: Payer: Self-pay | Admitting: Family Medicine

## 2020-09-18 ENCOUNTER — Ambulatory Visit (INDEPENDENT_AMBULATORY_CARE_PROVIDER_SITE_OTHER): Payer: BC Managed Care – PPO | Admitting: Family Medicine

## 2020-09-18 ENCOUNTER — Other Ambulatory Visit: Payer: Self-pay

## 2020-09-18 VITALS — BP 128/76 | HR 112 | Temp 97.6°F | Wt 151.5 lb

## 2020-09-18 DIAGNOSIS — N3000 Acute cystitis without hematuria: Secondary | ICD-10-CM | POA: Diagnosis not present

## 2020-09-18 DIAGNOSIS — E611 Iron deficiency: Secondary | ICD-10-CM | POA: Diagnosis not present

## 2020-09-18 DIAGNOSIS — K644 Residual hemorrhoidal skin tags: Secondary | ICD-10-CM

## 2020-09-18 DIAGNOSIS — R300 Dysuria: Secondary | ICD-10-CM

## 2020-09-18 LAB — POC URINALSYSI DIPSTICK (AUTOMATED)
Bilirubin, UA: NEGATIVE
Glucose, UA: POSITIVE — AB
Ketones, UA: NEGATIVE
Nitrite, UA: NEGATIVE
Protein, UA: POSITIVE — AB
Spec Grav, UA: 1.02 (ref 1.010–1.025)
Urobilinogen, UA: 0.2 E.U./dL
pH, UA: 6 (ref 5.0–8.0)

## 2020-09-18 MED ORDER — CIPROFLOXACIN HCL 500 MG PO TABS: 500.0000 mg | ORAL_TABLET | Freq: Two times a day (BID) | ORAL | 0 refills | Status: AC

## 2020-09-18 MED ORDER — HYDROCORTISONE ACETATE 25 MG RE SUPP: 25.0000 mg | Freq: Two times a day (BID) | RECTAL | 0 refills | Status: DC

## 2020-09-18 NOTE — Assessment & Plan Note (Signed)
Pt was starting iron supplement 2/2 to fatigue and ferritin <50. However, suspect this is the source of her stomach upset and intolerance of medication. Advised stopping given normal CBC and increasing iron in diet. Call if stomach issues do not improve

## 2020-09-18 NOTE — Assessment & Plan Note (Signed)
Hx consistent. Has GI and hx of fissures. Suppositories and constipation management. If not improving advise GI f/u as already established. Exam deferred. If more blood call and will consider CBC but normal on recent check.

## 2020-09-18 NOTE — Progress Notes (Signed)
Subjective:     Tiffany [name] is a 50 y.o. female presenting for Hemorrhoids and Dysuria (X 1 week )     HPI  #dysuria - x 1 one week - smell and cloudy coloration - wondering if medications are triggering this - has been having vomiting with her medications lately - vomiting x 1 week - started taking iron and started valacyclovir for prevention  - and vomiting after eating  #hemorrhoids - normal bowel pattern - bathroom every 2 days  - has had painful BM - then had a bloody bm - pain with bm and some pain at rest - sensation of squeezing - painful gas - treatment: hemorrhoid cream and oral pills - will taking sitz baths w/o improvement - hx of fissures   Review of Systems   Social History   Tobacco Use  Smoking Status Never Smoker  Smokeless Tobacco Never Used        Objective:    BP Readings from Last 3 Encounters:  09/18/20 128/76  08/22/20 130/82  06/05/20 122/82   Wt Readings from Last 3 Encounters:  09/18/20 151 lb 8 oz (68.7 kg)  08/22/20 148 lb 12 oz (67.5 kg)  06/05/20 149 lb 8 oz (67.8 kg)    BP 128/76   Pulse (!) 112   Temp 97.6 F (36.4 C) (Temporal)   Wt 151 lb 8 oz (68.7 kg)   LMP 08/08/2020 (Exact Date)   SpO2 98%   BMI 24.45 kg/m    Physical Exam Constitutional:      General: She is not in acute distress.    Appearance: She is well-developed. She is not diaphoretic.  HENT:     Right Ear: External ear normal.     Left Ear: External ear normal.     Nose: Nose normal.  Eyes:     Conjunctiva/sclera: Conjunctivae normal.  Cardiovascular:     Rate and Rhythm: Normal rate and regular rhythm.     Heart sounds: No murmur heard.   Pulmonary:     Effort: Pulmonary effort is normal. No respiratory distress.     Breath sounds: Normal breath sounds. No wheezing.  Abdominal:     General: Abdomen is flat. Bowel sounds are normal. There is no distension.     Palpations: Abdomen is soft.     Tenderness: There is abdominal  tenderness (LLQ). There is no guarding or rebound.  Musculoskeletal:     Cervical back: Neck supple.  Skin:    General: Skin is warm and dry.     Capillary Refill: Capillary refill takes less than 2 seconds.  Neurological:     Mental Status: She is alert. Mental status is at baseline.  Psychiatric:        Mood and Affect: Mood normal.        Behavior: Behavior normal.     UA: +LE     Assessment & Plan:   Problem List Items Addressed This Visit      Cardiovascular and Mediastinum   External hemorrhoid    Hx consistent. Has GI and hx of fissures. Suppositories and constipation management. If not improving advise GI f/u as already established. Exam deferred. If more blood call and will consider CBC but normal on recent check.         Other   Low iron    Pt was starting iron supplement 2/2 to fatigue and ferritin <50. However, suspect this is the source of her stomach upset and intolerance of medication.  Advised stopping given normal CBC and increasing iron in diet. Call if stomach issues do not improve       Other Visit Diagnoses    Acute cystitis without hematuria    -  Primary   Relevant Orders   Urine Culture   Dysuria       Relevant Orders   POCT Urinalysis Dipstick (Automated) (Completed)   Urine Culture     Urine with signs of infection. Pt notes hx of recurrent and only responded to cipro. Discussed risk and advised if culture shows sensitive bacteria would advise alternative agents in the future.   Return if symptoms worsen or fail to improve.  Lesleigh Noe, MD  This visit occurred during the SARS-CoV-2 public health emergency.  Safety protocols were in place, including screening questions prior to the visit, additional usage of staff PPE, and extensive cleaning of exam room while observing appropriate contact time as indicated for disinfecting solutions.

## 2020-09-18 NOTE — Patient Instructions (Signed)
Try to add more iron rich foods to diet  Take cipro for urine infection  You have a Hemorrhoid  Here are ways to prevent future issues and to treat your hemorrhoid 1) Make sure you get 25-35 g of INSOLUBLE fiber a day -- ideally from diet but you can also take a fiber supplement like Metamucil 2) Drink 1.5 to 2 liters of water daily  3) Avoid straining and prolonged periods on the toilet 4) Limit fatty foods and alcohol 5) Try to lose weight  How to care for your hemorrhoid now 1) Sitz Baths and warm water sprays 2) Wipe with a wet wipe or flushable wipe  3) Consider using stool softeners (Like Colace or Miralax) 4) Topical Hemorrhoid cream (Preparation H is available over the counter)  5) Steroid suppository - though can be expensive  Let me know if no improvement in 6 weeks -- you may need surgery

## 2020-09-20 ENCOUNTER — Other Ambulatory Visit: Payer: Self-pay | Admitting: Family Medicine

## 2020-09-20 DIAGNOSIS — N3000 Acute cystitis without hematuria: Secondary | ICD-10-CM

## 2020-09-20 DIAGNOSIS — Z803 Family history of malignant neoplasm of breast: Secondary | ICD-10-CM | POA: Diagnosis not present

## 2020-09-20 DIAGNOSIS — M7542 Impingement syndrome of left shoulder: Secondary | ICD-10-CM | POA: Diagnosis not present

## 2020-09-20 DIAGNOSIS — Z1231 Encounter for screening mammogram for malignant neoplasm of breast: Secondary | ICD-10-CM | POA: Diagnosis not present

## 2020-09-20 LAB — URINE CULTURE
MICRO NUMBER:: 11564267
SPECIMEN QUALITY:: ADEQUATE

## 2020-09-20 MED ORDER — AMPICILLIN 500 MG PO CAPS: 500.0000 mg | ORAL_CAPSULE | Freq: Four times a day (QID) | ORAL | 0 refills | Status: AC

## 2020-09-27 DIAGNOSIS — M2550 Pain in unspecified joint: Secondary | ICD-10-CM | POA: Diagnosis not present

## 2020-09-27 DIAGNOSIS — M0609 Rheumatoid arthritis without rheumatoid factor, multiple sites: Secondary | ICD-10-CM | POA: Diagnosis not present

## 2020-09-27 DIAGNOSIS — Z79899 Other long term (current) drug therapy: Secondary | ICD-10-CM | POA: Diagnosis not present

## 2020-10-03 DIAGNOSIS — N898 Other specified noninflammatory disorders of vagina: Secondary | ICD-10-CM | POA: Diagnosis not present

## 2020-10-03 DIAGNOSIS — N951 Menopausal and female climacteric states: Secondary | ICD-10-CM | POA: Diagnosis not present

## 2020-10-12 DIAGNOSIS — K3184 Gastroparesis: Secondary | ICD-10-CM | POA: Diagnosis not present

## 2020-10-12 DIAGNOSIS — Z1211 Encounter for screening for malignant neoplasm of colon: Secondary | ICD-10-CM | POA: Diagnosis not present

## 2020-10-12 DIAGNOSIS — K5901 Slow transit constipation: Secondary | ICD-10-CM | POA: Diagnosis not present

## 2020-10-18 DIAGNOSIS — N898 Other specified noninflammatory disorders of vagina: Secondary | ICD-10-CM | POA: Diagnosis not present

## 2020-11-05 DIAGNOSIS — E1065 Type 1 diabetes mellitus with hyperglycemia: Secondary | ICD-10-CM | POA: Diagnosis not present

## 2020-11-05 DIAGNOSIS — Z794 Long term (current) use of insulin: Secondary | ICD-10-CM | POA: Diagnosis not present

## 2020-11-05 DIAGNOSIS — E109 Type 1 diabetes mellitus without complications: Secondary | ICD-10-CM | POA: Diagnosis not present

## 2020-11-05 DIAGNOSIS — E1043 Type 1 diabetes mellitus with diabetic autonomic (poly)neuropathy: Secondary | ICD-10-CM | POA: Diagnosis not present

## 2020-11-09 DIAGNOSIS — H0288B Meibomian gland dysfunction left eye, upper and lower eyelids: Secondary | ICD-10-CM | POA: Diagnosis not present

## 2020-11-09 DIAGNOSIS — H0100B Unspecified blepharitis left eye, upper and lower eyelids: Secondary | ICD-10-CM | POA: Diagnosis not present

## 2020-11-09 DIAGNOSIS — H0100A Unspecified blepharitis right eye, upper and lower eyelids: Secondary | ICD-10-CM | POA: Diagnosis not present

## 2020-11-09 DIAGNOSIS — H04123 Dry eye syndrome of bilateral lacrimal glands: Secondary | ICD-10-CM | POA: Diagnosis not present

## 2020-11-09 DIAGNOSIS — Z794 Long term (current) use of insulin: Secondary | ICD-10-CM | POA: Diagnosis not present

## 2020-11-09 DIAGNOSIS — H0288A Meibomian gland dysfunction right eye, upper and lower eyelids: Secondary | ICD-10-CM | POA: Diagnosis not present

## 2020-11-09 DIAGNOSIS — E103512 Type 1 diabetes mellitus with proliferative diabetic retinopathy with macular edema, left eye: Secondary | ICD-10-CM | POA: Diagnosis not present

## 2020-11-09 LAB — HM DIABETES EYE EXAM

## 2020-11-19 ENCOUNTER — Ambulatory Visit: Payer: BC Managed Care – PPO | Admitting: Family Medicine

## 2020-11-20 DIAGNOSIS — M65811 Other synovitis and tenosynovitis, right shoulder: Secondary | ICD-10-CM | POA: Diagnosis not present

## 2020-11-21 ENCOUNTER — Ambulatory Visit (INDEPENDENT_AMBULATORY_CARE_PROVIDER_SITE_OTHER): Payer: BC Managed Care – PPO | Admitting: Family Medicine

## 2020-11-21 ENCOUNTER — Other Ambulatory Visit: Payer: Self-pay

## 2020-11-21 VITALS — BP 132/80 | HR 99 | Temp 96.9°F | Wt 151.0 lb

## 2020-11-21 DIAGNOSIS — R300 Dysuria: Secondary | ICD-10-CM

## 2020-11-21 DIAGNOSIS — IMO0002 Reserved for concepts with insufficient information to code with codable children: Secondary | ICD-10-CM

## 2020-11-21 DIAGNOSIS — N390 Urinary tract infection, site not specified: Secondary | ICD-10-CM | POA: Diagnosis not present

## 2020-11-21 DIAGNOSIS — E1065 Type 1 diabetes mellitus with hyperglycemia: Secondary | ICD-10-CM

## 2020-11-21 LAB — POC URINALSYSI DIPSTICK (AUTOMATED)
Bilirubin, UA: NEGATIVE
Blood, UA: NEGATIVE
Glucose, UA: POSITIVE — AB
Ketones, UA: NEGATIVE
Leukocytes, UA: NEGATIVE
Nitrite, UA: NEGATIVE
Protein, UA: NEGATIVE
Spec Grav, UA: 1.015 (ref 1.010–1.025)
Urobilinogen, UA: 0.2 E.U./dL
pH, UA: 6.5 (ref 5.0–8.0)

## 2020-11-21 LAB — BASIC METABOLIC PANEL
BUN: 14 mg/dL (ref 6–23)
CO2: 28 mEq/L (ref 19–32)
Calcium: 10.1 mg/dL (ref 8.4–10.5)
Chloride: 96 mEq/L (ref 96–112)
Creatinine, Ser: 0.74 mg/dL (ref 0.40–1.20)
GFR: 94.35 mL/min (ref >60.00)
Glucose, Bld: 233 mg/dL — ABNORMAL HIGH (ref 70–99)
Potassium: 4.2 mEq/L (ref 3.5–5.1)
Sodium: 132 mEq/L — ABNORMAL LOW (ref 135–145)

## 2020-11-21 MED ORDER — CIPROFLOXACIN HCL 500 MG PO TABS: 500.0000 mg | ORAL_TABLET | Freq: Two times a day (BID) | ORAL | 0 refills | Status: AC

## 2020-11-21 NOTE — Progress Notes (Signed)
Subjective:     Tiffany [name] is a 50 y.o. female presenting for Dysuria (Last week. Pt took 3 cipros that were left over from previous time) and Back Pain (Lower x 1 week )     HPI   #Dysuria  - burning sensation - her blood sugars have been high - symptoms x 1 week - burning with urination - took cipro over the weekend with improvement - last infection was 09/18/2020 and was enterococcus - notes no stool incontinence  Took cipro Sunday night last  Symptoms just started coming back now   Right flank pain but also across  Did have sex last week and symptoms started the next day but no previous episodes related to intercourse     Review of Systems  Constitutional: Negative for chills and fever.  Gastrointestinal: Positive for nausea (chronic). Negative for vomiting.  Genitourinary: Positive for flank pain, frequency and urgency.     Social History   Tobacco Use  Smoking Status Never Smoker  Smokeless Tobacco Never Used        Objective:    BP Readings from Last 3 Encounters:  11/21/20 132/80  09/18/20 128/76  08/22/20 130/82   Wt Readings from Last 3 Encounters:  11/21/20 151 lb (68.5 kg)  09/18/20 151 lb 8 oz (68.7 kg)  08/22/20 148 lb 12 oz (67.5 kg)    BP 132/80   Pulse 99   Temp (!) 96.9 F (36.1 C) (Temporal)   Wt 151 lb (68.5 kg)   SpO2 98%   BMI 24.37 kg/m    Physical Exam Constitutional:      General: She is not in acute distress.    Appearance: She is well-developed. She is not diaphoretic.  HENT:     Right Ear: External ear normal.     Left Ear: External ear normal.  Eyes:     Conjunctiva/sclera: Conjunctivae normal.  Cardiovascular:     Rate and Rhythm: Normal rate and regular rhythm.     Heart sounds: No murmur heard.   Pulmonary:     Effort: Pulmonary effort is normal. No respiratory distress.     Breath sounds: Normal breath sounds. No wheezing.  Abdominal:     General: Abdomen is flat.     Palpations: Abdomen is  soft.     Tenderness: There is abdominal tenderness. There is right CVA tenderness and left CVA tenderness. There is no guarding or rebound.  Musculoskeletal:     Cervical back: Neck supple.  Skin:    General: Skin is warm and dry.     Capillary Refill: Capillary refill takes less than 2 seconds.  Neurological:     Mental Status: She is alert. Mental status is at baseline.  Psychiatric:        Mood and Affect: Mood normal.        Behavior: Behavior normal.     UA: neg LE, neg nitrites, +glucose      Assessment & Plan:   Problem List Items Addressed This Visit      Endocrine   Diabetes mellitus type 1, uncontrolled, insulin dependent (HCC)    Continue insulin and endocrine f/u. More elevated sugars and glucose in urine. Will check kidney function today, reassured that no protein on dip.       Relevant Orders   Basic metabolic panel     Genitourinary   Recurrent UTI    Pt with several prior UTI typically associated with incontinence of stool episodes though today ?  sexual intercourse. Has not seen urology. Due to frequency will place referral.       Relevant Orders   Ambulatory referral to Urology    Other Visit Diagnoses    Dysuria    -  Primary   Relevant Orders   POCT Urinalysis Dipstick (Automated) (Completed)   Complicated UTI (urinary tract infection)       Relevant Medications   ciprofloxacin (CIPRO) 500 MG tablet   Other Relevant Orders   Urine Culture   Ambulatory referral to Urology     UA negative in setting of taking cipro over the weekend but with return of symptoms and CVA tenderness concerning for possible complicated UTI. Will treat with cipro for 6 days. Discussed importance of completing the course of abx.   Return if symptoms worsen or fail to improve.  Lesleigh Noe, MD  This visit occurred during the SARS-CoV-2 public health emergency.  Safety protocols were in place, including screening questions prior to the visit, additional usage of  staff PPE, and extensive cleaning of exam room while observing appropriate contact time as indicated for disinfecting solutions.

## 2020-11-21 NOTE — Assessment & Plan Note (Signed)
Continue insulin and endocrine f/u. More elevated sugars and glucose in urine. Will check kidney function today, reassured that no protein on dip.

## 2020-11-21 NOTE — Patient Instructions (Signed)
Complete the antibiotic  Labs today  Call if fevers, chills, not improving or worsening

## 2020-11-21 NOTE — Assessment & Plan Note (Signed)
Pt with several prior UTI typically associated with incontinence of stool episodes though today ?sexual intercourse. Has not seen urology. Due to frequency will place referral.

## 2020-11-22 ENCOUNTER — Encounter: Payer: Self-pay | Admitting: Family Medicine

## 2020-11-22 DIAGNOSIS — E10319 Type 1 diabetes mellitus with unspecified diabetic retinopathy without macular edema: Secondary | ICD-10-CM | POA: Insufficient documentation

## 2020-11-22 LAB — URINE CULTURE
MICRO NUMBER:: 11821021
Result:: NO GROWTH
SPECIMEN QUALITY:: ADEQUATE

## 2020-11-25 DIAGNOSIS — M65811 Other synovitis and tenosynovitis, right shoulder: Secondary | ICD-10-CM | POA: Diagnosis not present

## 2020-11-27 ENCOUNTER — Telehealth: Payer: Self-pay | Admitting: Family Medicine

## 2020-11-27 DIAGNOSIS — N644 Mastodynia: Secondary | ICD-10-CM | POA: Diagnosis not present

## 2020-11-27 NOTE — Telephone Encounter (Signed)
Patient came in office requesting to have prescription form be filled out . It was place in review folder

## 2020-11-28 ENCOUNTER — Other Ambulatory Visit (HOSPITAL_COMMUNITY): Payer: BC Managed Care – PPO

## 2020-11-29 NOTE — Telephone Encounter (Signed)
Prescription completed and will place up front for pick-up

## 2020-11-29 NOTE — Telephone Encounter (Signed)
Mrs. Island called in and stated that she needed the spa to help with joint pain, and to help with taxes- it elimates with the prescription. She needs it today

## 2020-11-30 ENCOUNTER — Ambulatory Visit (HOSPITAL_BASED_OUTPATIENT_CLINIC_OR_DEPARTMENT_OTHER)
Admission: RE | Admit: 2020-11-30 | Payer: BC Managed Care – PPO | Source: Home / Self Care | Admitting: Obstetrics and Gynecology

## 2020-11-30 SURGERY — HYSTEROSCOPY, USING RESECTOSCOPE
Anesthesia: General

## 2020-12-05 ENCOUNTER — Ambulatory Visit (INDEPENDENT_AMBULATORY_CARE_PROVIDER_SITE_OTHER): Payer: BC Managed Care – PPO | Admitting: Urology

## 2020-12-05 ENCOUNTER — Other Ambulatory Visit: Payer: Self-pay

## 2020-12-05 ENCOUNTER — Encounter: Payer: Self-pay | Admitting: Urology

## 2020-12-05 VITALS — BP 121/74 | HR 99 | Ht 67.0 in | Wt 150.0 lb

## 2020-12-05 DIAGNOSIS — R3121 Asymptomatic microscopic hematuria: Secondary | ICD-10-CM | POA: Diagnosis not present

## 2020-12-05 DIAGNOSIS — N390 Urinary tract infection, site not specified: Secondary | ICD-10-CM

## 2020-12-05 MED ORDER — NITROFURANTOIN MONOHYD MACRO 100 MG PO CAPS: 100.0000 mg | ORAL_CAPSULE | Freq: Every day | ORAL | 6 refills | Status: DC | PRN

## 2020-12-05 NOTE — Progress Notes (Signed)
12/05/20 12:49 PM   Tiffany [name] 1971/07/06 [id number]  CC: recurrent UTIs  HPI: I saw Ms. Pigman today for evaluation of recurrent UTIs.  She is a 50 year old female with chronic constipation and type 1 diabetes(hemoglobin A1c 8.6) who reports a long history of recurrent urinary tract infections.  I do not see that these are culture documented in the chart, but most recent UTI 09/18/2020 showed > 100 K Enterococcus.  She says the only antibiotic that seems to work for her infections is Cipro.  She does think these are associated with sexual activity.  She thinks she gets a UTI every 1 to 2 months.  Symptoms with UTI dysuria, difficulty urinating, and foul-smelling urine.  She denies any gross hematuria.  CT in 2019 showed no evidence of stones or hydronephrosis, and KUB in May 2021 showed constipation but no stones.  Urinalysis today shows microscopic hematuria with greater than 30 RBCs but otherwise benign.  She was recently treated for a UTI.   PMH: Past Medical History:  Diagnosis Date  . Arthritis    knees, lower back right side  . Connective tissue disease ([place])   . Depression   . Diabetes mellitus   . Gastroparesis   . Headache(784.0)   . Heart murmur    as a child - no problem as adult  . History of kidney stones    no surgery  . Hyperlipidemia   . Lupus [facility])     Surgical History: Past Surgical History:  Procedure Laterality Date  . [place], 2006   x 2  . COLONOSCOPY WITH PROPOFOL N/A 03/16/2013   Procedure: COLONOSCOPY WITH PROPOFOL;  Surgeon: Arta Silence, MD;  Location: [facility];  Service: Endoscopy;  Laterality: N/A;  . HYSTEROSCOPY WITH D & C N/A 07/11/2013   Procedure: DILATATION AND CURETTAGE /HYSTEROSCOPY;  Surgeon: Marvene Staff, MD;  Location: [facility];  Service: Gynecology;  Laterality: N/A;  . left surgery shoulder    . ROBOTIC ASSISTED LAPAROSCOPIC LYSIS OF ADHESION N/A 07/11/2013   Procedure: ROBOTIC ASSISTED LAPAROSCOPIC  LYSIS OF ADHESIONS ; REPAIR OF UTERINE DEHISENCE;  Surgeon: Marvene Staff, MD;  Location: [facility];  Service: Gynecology;  Laterality: N/A;  . TUBAL LIGATION      Family History: Family History  Problem Relation Age of Onset  . Diabetes Sister   . Arthritis Sister   . Hyperlipidemia Sister   . Lung cancer Mother   . Cancer Mother        ?Uterine cancer  . Alzheimer's disease Maternal Grandmother     Social History:  reports that she has never smoked. She has never used smokeless tobacco. She reports that she does not drink alcohol and does not use drugs.  Physical Exam: BP 121/74 (BP Location: Left Arm, Patient Position: Sitting, Cuff Size: Normal)   Pulse 99   Ht 5\' 7"  (1.702 m)   Wt 150 lb (68 kg)   BMI 23.49 kg/m    Constitutional:  Alert and oriented, No acute distress. Cardiovascular: No clubbing, cyanosis, or edema. Respiratory: Normal respiratory effort, no increased work of breathing. GI: Abdomen is soft, nontender, nondistended, no abdominal masses  Laboratory Data: Reviewed, see HPI  Pertinent Imaging: I have personally viewed and interpreted the CT from 2019 showing no stones or hydronephrosis, and KUB from 11/2019 with no evidence of stones  Assessment & Plan:   50 year old female with chronic constipation and diabetes(hemoglobin A1c 8.6) with recurrent UTIs that are associated with  sexual activity.  She recently completed a course of antibiotics for UTI, urinalysis today does show microscopic hematuria with greater than 30 RBCs.  We discussed the evaluation and treatment of patients with recurrent UTIs at length.  We specifically discussed the differences between asymptomatic bacteriuria and true urinary tract infection.  We discussed the AUA definition of recurrent UTI of at least 2 culture proven symptomatic acute cystitis episodes in a 80-month period, or 3 within a 1 year period.  We discussed the importance of culture directed antibiotic treatment, and  antibiotic stewardship.  First-line therapy includes nitrofurantoin(5 days), Bactrim(3 days), or fosfomycin(3 g single dose).  Possible etiologies of recurrent infection include periurethral tissue atrophy in postmenopausal woman, constipation, sexual activity, incomplete emptying, anatomic abnormalities, and even genetic predisposition.  Finally, we discussed the role of perineal hygiene, timed voiding, adequate hydration, topical vaginal estrogen, cranberry prophylaxis, and low-dose antibiotic prophylaxis.  -Cranberry tablets 1-2 times daily for UTI prevention -Postcoital nitrofurantoin -RTC 2 to 4 weeks with repeat urinalysis-> if persistent microscopic hematuria would recommend renal ultrasound and cystoscopy -Consider topical estrogen cream in the future if recurrent infections despite above strategies  Nickolas Madrid, MD 12/05/2020  [facility] [address], [place], [place] [address] [phone]

## 2020-12-05 NOTE — Patient Instructions (Signed)
Urinary Tract Infection, Adult  A urinary tract infection (UTI) is an infection of any part of the urinary tract. The urinary tract includes the kidneys, ureters, bladder, and urethra. These organs make, store, and get rid of urine in the body. An upper UTI affects the ureters and kidneys. A lower UTI affects the bladder and urethra. What are the causes? Most urinary tract infections are caused by bacteria in your genital area around your urethra, where urine leaves your body. These bacteria grow and cause inflammation of your urinary tract. What increases the risk? You are more likely to develop this condition if:  You have a urinary catheter that stays in place.  You are not able to control when you urinate or have a bowel movement (incontinence).  You are female and you: ? Use a spermicide or diaphragm for birth control. ? Have low estrogen levels. ? Are pregnant.  You have certain genes that increase your risk.  You are sexually active.  You take antibiotic medicines.  You have a condition that causes your flow of urine to slow down, such as: ? An enlarged prostate, if you are female. ? Blockage in your urethra. ? A kidney stone. ? A nerve condition that affects your bladder control (neurogenic bladder). ? Not getting enough to drink, or not urinating often.  You have certain medical conditions, such as: ? Diabetes. ? A weak disease-fighting system (immunesystem). ? Sickle cell disease. ? Gout. ? Spinal cord injury. What are the signs or symptoms? Symptoms of this condition include:  Needing to urinate right away (urgency).  Frequent urination. This may include small amounts of urine each time you urinate.  Pain or burning with urination.  Blood in the urine.  Urine that smells bad or unusual.  Trouble urinating.  Cloudy urine.  Vaginal discharge, if you are female.  Pain in the abdomen or the lower back. You may also have:  Vomiting or a decreased  appetite.  Confusion.  Irritability or tiredness.  A fever or chills.  Diarrhea. The first symptom in older adults may be confusion. In some cases, they may not have any symptoms until the infection has worsened. How is this diagnosed? This condition is diagnosed based on your medical history and a physical exam. You may also have other tests, including:  Urine tests.  Blood tests.  Tests for STIs (sexually transmitted infections). If you have had more than one UTI, a cystoscopy or imaging studies may be done to determine the cause of the infections. How is this treated? Treatment for this condition includes:  Antibiotic medicine.  Over-the-counter medicines to treat discomfort.  Drinking enough water to stay hydrated. If you have frequent infections or have other conditions such as a kidney stone, you may need to see a health care provider who specializes in the urinary tract (urologist). In rare cases, urinary tract infections can cause sepsis. Sepsis is a life-threatening condition that occurs when the body responds to an infection. Sepsis is treated in the hospital with IV antibiotics, fluids, and other medicines. Follow these instructions at home: Medicines  Take over-the-counter and prescription medicines only as told by your health care provider.  If you were prescribed an antibiotic medicine, take it as told by your health care provider. Do not stop using the antibiotic even if you start to feel better. General instructions  Make sure you: ? Empty your bladder often and completely. Do not hold urine for long periods of time. ? Empty your bladder after   sex. ? Wipe from front to back after urinating or having a bowel movement if you are female. Use each tissue only one time when you wipe.  Drink enough fluid to keep your urine pale yellow.  Keep all follow-up visits. This is important.   Contact a health care provider if:  Your symptoms do not get better after 1-2  days.  Your symptoms go away and then return. Get help right away if:  You have severe pain in your back or your lower abdomen.  You have a fever or chills.  You have nausea or vomiting. Summary  A urinary tract infection (UTI) is an infection of any part of the urinary tract, which includes the kidneys, ureters, bladder, and urethra.  Most urinary tract infections are caused by bacteria in your genital area.  Treatment for this condition often includes antibiotic medicines.  If you were prescribed an antibiotic medicine, take it as told by your health care provider. Do not stop using the antibiotic even if you start to feel better.  Keep all follow-up visits. This is important. This information is not intended to replace advice given to you by your health care provider. Make sure you discuss any questions you have with your health care provider. Document Revised: 02/24/2020 Document Reviewed: 02/24/2020 Elsevier Patient Education  2021 Elsevier Inc.  

## 2020-12-06 LAB — MICROSCOPIC EXAMINATION: RBC, Urine: >30 /hpf — AB (ref 0–2)

## 2020-12-06 LAB — URINALYSIS, COMPLETE
Bilirubin, UA: NEGATIVE
Glucose, UA: NEGATIVE
Ketones, UA: NEGATIVE
Leukocytes,UA: NEGATIVE
Nitrite, UA: NEGATIVE
Specific Gravity, UA: 1.025 (ref 1.005–1.030)
Urobilinogen, Ur: 0.2 mg/dL (ref 0.2–1.0)
pH, UA: 5 (ref 5.0–7.5)

## 2020-12-14 DIAGNOSIS — E1065 Type 1 diabetes mellitus with hyperglycemia: Secondary | ICD-10-CM | POA: Diagnosis not present

## 2020-12-14 DIAGNOSIS — E109 Type 1 diabetes mellitus without complications: Secondary | ICD-10-CM | POA: Diagnosis not present

## 2020-12-14 DIAGNOSIS — Z794 Long term (current) use of insulin: Secondary | ICD-10-CM | POA: Diagnosis not present

## 2021-01-07 DIAGNOSIS — Z79899 Other long term (current) drug therapy: Secondary | ICD-10-CM | POA: Diagnosis not present

## 2021-01-07 DIAGNOSIS — M2550 Pain in unspecified joint: Secondary | ICD-10-CM | POA: Diagnosis not present

## 2021-01-07 DIAGNOSIS — M0609 Rheumatoid arthritis without rheumatoid factor, multiple sites: Secondary | ICD-10-CM | POA: Diagnosis not present

## 2021-01-09 ENCOUNTER — Ambulatory Visit: Payer: Self-pay | Admitting: Physician Assistant

## 2021-01-15 DIAGNOSIS — E109 Type 1 diabetes mellitus without complications: Secondary | ICD-10-CM | POA: Diagnosis not present

## 2021-01-15 DIAGNOSIS — E1043 Type 1 diabetes mellitus with diabetic autonomic (poly)neuropathy: Secondary | ICD-10-CM | POA: Diagnosis not present

## 2021-01-15 DIAGNOSIS — E1065 Type 1 diabetes mellitus with hyperglycemia: Secondary | ICD-10-CM | POA: Diagnosis not present

## 2021-01-15 DIAGNOSIS — Z794 Long term (current) use of insulin: Secondary | ICD-10-CM | POA: Diagnosis not present

## 2021-01-25 DIAGNOSIS — Z794 Long term (current) use of insulin: Secondary | ICD-10-CM | POA: Diagnosis not present

## 2021-01-25 DIAGNOSIS — E1065 Type 1 diabetes mellitus with hyperglycemia: Secondary | ICD-10-CM | POA: Diagnosis not present

## 2021-01-30 DIAGNOSIS — Z79899 Other long term (current) drug therapy: Secondary | ICD-10-CM | POA: Diagnosis not present

## 2021-02-05 DIAGNOSIS — E1043 Type 1 diabetes mellitus with diabetic autonomic (poly)neuropathy: Secondary | ICD-10-CM | POA: Diagnosis not present

## 2021-02-06 ENCOUNTER — Encounter: Payer: Self-pay | Admitting: Family Medicine

## 2021-02-18 DIAGNOSIS — E1043 Type 1 diabetes mellitus with diabetic autonomic (poly)neuropathy: Secondary | ICD-10-CM | POA: Diagnosis not present

## 2021-03-07 ENCOUNTER — Ambulatory Visit: Payer: Self-pay | Admitting: Physician Assistant

## 2021-03-26 ENCOUNTER — Telehealth: Payer: Self-pay | Admitting: Family Medicine

## 2021-03-26 DIAGNOSIS — R61 Generalized hyperhidrosis: Secondary | ICD-10-CM

## 2021-03-26 MED ORDER — ATORVASTATIN CALCIUM 40 MG PO TABS: 40.0000 mg | ORAL_TABLET | Freq: Every morning | ORAL | 3 refills | Status: DC

## 2021-03-26 NOTE — Telephone Encounter (Signed)
Pt called wanting to know if Dr. Einar Pheasant would prescribe  atorvastatin (LIPITOR) 40 MG tablet and Drysol that her former PCP did

## 2021-03-26 NOTE — Telephone Encounter (Signed)
Assuming she uses Drysol for sweating.   Please clarify how often pt is using this.

## 2021-03-26 NOTE — Addendum Note (Signed)
Addended by: Lesleigh Noe on: 03/26/2021 04:49 PM   Modules accepted: Orders

## 2021-03-27 NOTE — Telephone Encounter (Signed)
Mychart message sent to pt for clarification

## 2021-04-02 MED ORDER — ALUMINUM CHLORIDE 20 % EX SOLN: CUTANEOUS | 0 refills | Status: DC

## 2021-04-02 NOTE — Telephone Encounter (Signed)
Tiffany [name] called in and stated that it is for sweating and she is using it once every 2 days

## 2021-04-02 NOTE — Telephone Encounter (Signed)
Tiffany [name] called in and stated that it is for sweating and she is using it once every  2 days

## 2021-04-02 NOTE — Addendum Note (Signed)
Addended by: Pleas Koch on: 04/02/2021 01:32 PM   Modules accepted: Orders

## 2021-04-02 NOTE — Telephone Encounter (Signed)
Noted, Rx sent to pharmacy. 

## 2021-04-15 DIAGNOSIS — M19011 Primary osteoarthritis, right shoulder: Secondary | ICD-10-CM | POA: Diagnosis not present

## 2021-04-15 DIAGNOSIS — M7541 Impingement syndrome of right shoulder: Secondary | ICD-10-CM | POA: Diagnosis not present

## 2021-04-16 DIAGNOSIS — E1065 Type 1 diabetes mellitus with hyperglycemia: Secondary | ICD-10-CM | POA: Diagnosis not present

## 2021-04-17 DIAGNOSIS — E1065 Type 1 diabetes mellitus with hyperglycemia: Secondary | ICD-10-CM | POA: Diagnosis not present

## 2021-04-17 DIAGNOSIS — Z794 Long term (current) use of insulin: Secondary | ICD-10-CM | POA: Diagnosis not present

## 2021-04-17 DIAGNOSIS — E1043 Type 1 diabetes mellitus with diabetic autonomic (poly)neuropathy: Secondary | ICD-10-CM | POA: Diagnosis not present

## 2021-04-17 DIAGNOSIS — E109 Type 1 diabetes mellitus without complications: Secondary | ICD-10-CM | POA: Diagnosis not present

## 2021-04-18 DIAGNOSIS — M19011 Primary osteoarthritis, right shoulder: Secondary | ICD-10-CM | POA: Diagnosis not present

## 2021-04-19 DIAGNOSIS — M0609 Rheumatoid arthritis without rheumatoid factor, multiple sites: Secondary | ICD-10-CM | POA: Diagnosis not present

## 2021-04-19 DIAGNOSIS — Z79899 Other long term (current) drug therapy: Secondary | ICD-10-CM | POA: Diagnosis not present

## 2021-04-19 DIAGNOSIS — Z794 Long term (current) use of insulin: Secondary | ICD-10-CM | POA: Diagnosis not present

## 2021-04-19 DIAGNOSIS — E1065 Type 1 diabetes mellitus with hyperglycemia: Secondary | ICD-10-CM | POA: Diagnosis not present

## 2021-05-01 ENCOUNTER — Telehealth: Payer: Self-pay | Admitting: Family Medicine

## 2021-05-01 NOTE — Telephone Encounter (Signed)
Pt daughter called wanted to have a referral to psychiatry for her mother.  Pt is becoming very sensitive to things and upset with outburst when things don't go her way (screaming- cussing, punching and throwing things)  Pt has no one on her DPR

## 2021-05-02 NOTE — Telephone Encounter (Signed)
Attempted to call pt.   Will send mychart message

## 2021-05-06 DIAGNOSIS — Z0000 Encounter for general adult medical examination without abnormal findings: Secondary | ICD-10-CM | POA: Diagnosis not present

## 2021-05-06 DIAGNOSIS — Z9641 Presence of insulin pump (external) (internal): Secondary | ICD-10-CM | POA: Diagnosis not present

## 2021-05-06 DIAGNOSIS — E1043 Type 1 diabetes mellitus with diabetic autonomic (poly)neuropathy: Secondary | ICD-10-CM | POA: Diagnosis not present

## 2021-05-06 DIAGNOSIS — Z13220 Encounter for screening for lipoid disorders: Secondary | ICD-10-CM | POA: Diagnosis not present

## 2021-05-06 DIAGNOSIS — Z01419 Encounter for gynecological examination (general) (routine) without abnormal findings: Secondary | ICD-10-CM | POA: Diagnosis not present

## 2021-05-06 DIAGNOSIS — Z131 Encounter for screening for diabetes mellitus: Secondary | ICD-10-CM | POA: Diagnosis not present

## 2021-05-06 DIAGNOSIS — Z124 Encounter for screening for malignant neoplasm of cervix: Secondary | ICD-10-CM | POA: Diagnosis not present

## 2021-05-06 DIAGNOSIS — K3184 Gastroparesis: Secondary | ICD-10-CM | POA: Diagnosis not present

## 2021-05-06 DIAGNOSIS — E1065 Type 1 diabetes mellitus with hyperglycemia: Secondary | ICD-10-CM | POA: Diagnosis not present

## 2021-05-06 LAB — HM PAP SMEAR: HM Pap smear: NEGATIVE

## 2021-05-06 LAB — RESULTS CONSOLE HPV: CHL HPV: NEGATIVE

## 2021-05-08 DIAGNOSIS — E1043 Type 1 diabetes mellitus with diabetic autonomic (poly)neuropathy: Secondary | ICD-10-CM | POA: Diagnosis not present

## 2021-05-09 ENCOUNTER — Telehealth: Payer: Self-pay

## 2021-05-09 ENCOUNTER — Ambulatory Visit (INDEPENDENT_AMBULATORY_CARE_PROVIDER_SITE_OTHER): Payer: BC Managed Care – PPO

## 2021-05-09 ENCOUNTER — Other Ambulatory Visit: Payer: Self-pay

## 2021-05-09 ENCOUNTER — Ambulatory Visit
Admission: RE | Admit: 2021-05-09 | Discharge: 2021-05-09 | Disposition: A | Discharge status: Home / Self Care | Payer: BC Managed Care – PPO | Source: Ambulatory Visit | Attending: Internal Medicine | Admitting: Internal Medicine

## 2021-05-09 VITALS — BP 139/80 | HR 96 | Temp 97.5°F | Resp 18

## 2021-05-09 DIAGNOSIS — R079 Chest pain, unspecified: Secondary | ICD-10-CM

## 2021-05-09 DIAGNOSIS — M25562 Pain in left knee: Secondary | ICD-10-CM

## 2021-05-09 DIAGNOSIS — M25561 Pain in right knee: Secondary | ICD-10-CM | POA: Diagnosis not present

## 2021-05-09 DIAGNOSIS — R0789 Other chest pain: Secondary | ICD-10-CM

## 2021-05-09 DIAGNOSIS — R509 Fever, unspecified: Secondary | ICD-10-CM

## 2021-05-09 MED ORDER — ALBUTEROL SULFATE HFA 108 (90 BASE) MCG/ACT IN AERS: 1.0000 | INHALATION_SPRAY | Freq: Four times a day (QID) | RESPIRATORY_TRACT | 0 refills | Status: DC | PRN

## 2021-05-09 NOTE — Telephone Encounter (Signed)
[facility] - Client TELEPHONE ADVICE RECORD [facility] Patient Name: Tiffany [name] Gender: Female DOB: Dec 13, 1970 Age: 50 Y 10 M 10 D Return Phone Number: [phone] (Primary), [phone] (Secondary) Address[place]  [address] Client [facility] - Client Client Site [facility] Physician Waunita Schooner- MD Contact Type Call Who Is Calling Patient / Member / Family / Caregiver Call Type Triage / Clinical Caller Name Mr Kronick Relationship To Patient Spouse Return Phone Number [phone] (Primary) Chief Complaint CHEST PAIN - pain, pressure, heaviness or tightness Reason for Call Symptomatic / Request for Health Information Initial Comment Callers wife is experiencing covid like symptoms, headache, fever, chest pain, joint pain. Translation No Nurse Assessment Nurse: Quincy Simmonds, RN, Annita Brod Date/Time [name] Time): 05/08/2021 7:45:49 PM Confirm and document reason for call. If symptomatic, describe symptoms. ---Callers wife is experiencing covid like symptoms, headache, fever, chest pain, joint pain. Chest pain started yesterday, temp is 100.0 home covid test is negative Does the patient have any new or worsening symptoms? ---Yes Will a triage be completed? ---Yes Related visit to physician within the last 2 weeks? ---No Does the PT have any chronic conditions? (i.e. diabetes, asthma, this includes High risk factors for pregnancy, etc.) ---Yes List chronic conditions. ---DM, autoimmune disease, arthritis Is the patient pregnant or possibly pregnant? (Ask all females between the ages of 63-55) ---No Is this a behavioral health or substance abuse call? ---No Guidelines Guideline Title Affirmed Question Affirmed Notes Nurse Date/Time (Eastern Time) COVID-19 - Diagnosed or Suspected Difficult to awaken or acting confused (e.g., disoriented,  slurred speech) Quincy Simmonds, RN, Annita Brod 05/08/2021 7:46:50 PM PLEASE NOTE: All timestamps contained within this report are represented as [place] Standard Time. CONFIDENTIALTY NOTICE: This fax transmission is intended only for the addressee. It contains information that is legally privileged, confidential or otherwise protected from use or disclosure. If you are not the intended recipient, you are strictly prohibited from reviewing, disclosing, copying using or disseminating any of this information or taking any action in reliance on or regarding this information. If you have received this fax in error, please notify us immediately by telephone so that we can arrange for its return to [place]. Phone: [phone], Toll-Free: [phone], Fax: [phone] Page: 2 of 2 Call Id: [id number] [place]. Time [name] Time) Disposition Final User 05/08/2021 7:43:10 PM Send to Urgent [facility] 05/08/2021 7:49:21 PM 911 Outcome Documentation Quincy Simmonds, RN, Annita Brod Reason: Patient refused to call 911, will have caller drive them to ER instead. 05/08/2021 7:48:05 PM Call EMS 911 Now Yes Quincy Simmonds, RN, Annita Brod Caller Disagree/Comply Disagree Caller Understands Yes PreDisposition Call Doctor Care Advice Given Per Guideline CALL EMS 911 NOW: * Immediate medical attention is needed. You need to hang up and call 911 (or an ambulance). TELL THE AMBULANCE DISPATCHER ABOUT COVID-19 DIAGNOSIS: * When you call 911, tell the dispatcher that you probably have COVID-19. * Triager Discretion: I'll call you back in a few minutes to be sure you were able to reach them. CARE ADVICE given per COVID-19 - DIAGNOSED OR SUSPECTED (Adult) guideline. Referrals GO TO FACILITY UNDECIDE

## 2021-05-09 NOTE — Telephone Encounter (Signed)
I spoke with pts husband; pt is resting and he did not want to disturb pt; pt went to ED on 05/08/21 but ED was too busy and pt did not stay. Today pt still has chest pain which is worse with deep breath, pt has fever, chills and body aches. Pt took home covid test which was neg. Pts symptoms started on 05/07/21. I scheduled pt an appt at [facility] today at 12 noon. UC & ED precautions given and pts husband voiced understanding. Sending note to Dr Einar Pheasant and Alyse Low CMA and will teams [place].

## 2021-05-09 NOTE — Discharge Instructions (Addendum)
Your EKG was unremarkable.  Your chest x-ray was normal.  You have been prescribed albuterol inhaler to help alleviate chest tightness.  Please also take meloxicam as needed for pain and inflammation.  Follow-up with primary care soon as possible for further evaluation and management.  Blood work is pending.  We will call if there are any abnormalities.

## 2021-05-09 NOTE — ED Provider Notes (Signed)
Tiffany [name]    CSN: [id number] Arrival date & time: 05/09/21  1138      History   Chief Complaint Chief Complaint  Patient presents with   fever/arthritis    HPI [place] Esco is a 50 y.o. female.   Patient presents with fever, chest pain, swollen joints, headache that has been present for 2 to 3 days.  Patient reports that chest pain is center of the chest and radiates to the right.  Patient reports that she normally has this chest pain intermittently due to her "chronic illnesses".  Patient states that her rheumatologist has told her to take methotrexate and another medication that she has that she cannot remember the name of.  Patient reports that she took these medications but chest pain has not improved and has seemed to worsen.  Patient is a poor historian.  Chest pain is constant and described as a "tightness".  Pain does not radiate.  Pain is not aggravated by other factors.  Temp max at home was 101.  Patient denies any upper respiratory symptoms, nausea, vomiting, diarrhea, abdominal pain.  Patient does report that she has had swollen and painful ankles and knees but that has now resolved.  Patient went to the ED with symptoms first started but left before being seen due to long wait time.  Patient called PCP today and was advised to come to the urgent [name] for further evaluation.    Past Medical History:  Diagnosis Date   Arthritis    knees, lower back right side   Connective tissue disease ([place])    Depression    Diabetes mellitus    Gastroparesis    Headache(784.0)    Heart murmur    as a child - no problem as adult   History of kidney stones    no surgery   Hyperlipidemia    Lupus ([place])     Patient Active Problem List   Diagnosis Date Noted   Diabetic retinopathy associated with type 1 diabetes mellitus ([place]) 11/22/2020   External hemorrhoid 09/18/2020   Low iron 09/18/2020   History of shingles 08/22/2020   Post-nasal drip 08/22/2020    Rheumatoid arthritis ([place]) 06/05/2020   Oral herpes simplex infection 06/05/2020   Migraines 06/05/2020   Fibroids 06/05/2020   Recurrent UTI 06/05/2020   Primary osteoarthritis of right hip 09/28/2018   Adhesive capsulitis of left shoulder 03/24/2018   Sjogren's syndrome without extraglandular involvement ([place]) 02/24/2018   SS-A antibody positive 02/24/2018   Fibromyalgia 12/02/2017   G6PD deficiency 10/26/2017   Diabetes mellitus type 1, uncontrolled, insulin dependent 04/03/2017   Primary generalized (osteo)arthritis 11/17/2016   Primary osteoarthritis, left hand 07/13/2015   Gastroparesis due to DM ([place]) 08/30/2013   Pelvic floor dysfunction 08/18/2013   Constipation 08/03/2013    Past Surgical History:  Procedure Laterality Date   CESAREAN SECTION  1994, 2006   x 2   COLONOSCOPY WITH PROPOFOL N/A 03/16/2013   Procedure: COLONOSCOPY WITH PROPOFOL;  Surgeon: Arta Silence, MD;  Location: [facility];  Service: Endoscopy;  Laterality: N/A;   HYSTEROSCOPY WITH D & C N/A 07/11/2013   Procedure: DILATATION AND CURETTAGE /HYSTEROSCOPY;  Surgeon: Marvene Staff, MD;  Location: [facility];  Service: Gynecology;  Laterality: N/A;   left surgery shoulder     ROBOTIC ASSISTED LAPAROSCOPIC LYSIS OF ADHESION N/A 07/11/2013   Procedure: ROBOTIC ASSISTED LAPAROSCOPIC LYSIS OF ADHESIONS ; REPAIR OF UTERINE DEHISENCE;  Surgeon: Marvene Staff, MD;  Location: [facility];  Service: Gynecology;  Laterality: N/A;   TUBAL LIGATION      OB History   No obstetric history on file.      Home Medications    Prior to Admission medications   Medication Sig Start Date End Date Taking? Authorizing Provider  albuterol (VENTOLIN HFA) 108 (90 Base) MCG/ACT inhaler Inhale 1-2 puffs into the lungs every 6 (six) hours as needed for wheezing or shortness of breath. 05/09/21  Yes Odis Luster, FNP  aluminum chloride (DRYSOL) 20 % external solution Apply topically every other day. For sweating.  04/02/21   Pleas Koch, NP  APIDRA SOLOSTAR 100 UNIT/ML Solostar Pen Inject into the skin. 05/29/20   [provider]  atorvastatin (LIPITOR) 40 MG tablet Take 1 tablet (40 mg total) by mouth every morning. 03/26/21   Lesleigh Noe, MD  azelastine (ASTELIN) 0.1 % nasal spray Place into both nostrils. 03/22/20   [provider]  bisacodyl (DULCOLAX) 5 MG EC tablet Take by mouth as needed.    [provider]  Blood Glucose Monitoring Suppl (GLUCOCOM BLOOD GLUCOSE MONITOR) DEVI Use to test blood sugar 4-6 times daily 08/02/19   [provider]  Blood Glucose Monitoring Suppl (ONE TOUCH ULTRA 2) w/Device KIT SMARTSIG:1 Each Via Meter As Directed 11/20/20   [provider]  bupivacaine (MARCAINE) 0.25 % injection Inject into the skin. 08/16/20   [provider]  Continuous Blood Gluc Receiver ([place]) [place] 1 each by [place].(Non-Drug; Combo Route) route daily. 05/10/19   [provider]  Continuous Blood Gluc Sensor (DEXCOM G6 SENSOR) MISC Apply 1 sensor to the skin every 10 days for continuous glucose monitoring. 05/10/19   [provider]  Continuous Blood Gluc Transmit (DEXCOM G6 TRANSMITTER) MISC Use as directed for continuous glucose monitoring. Reuse transmitter for 90 days then discard and replace. 05/10/19   [provider]  cyclobenzaprine (FLEXERIL) 10 MG tablet Take 10 mg by mouth 3 (three) times daily as needed. Muscle spasms 12/01/15   [provider]  diclofenac (VOLTAREN) 50 MG EC tablet Take 50 mg by mouth 2 (two) times daily. 08/02/20   [provider]  ferrous sulfate 325 (65 FE) MG tablet Take 1 tablet (325 mg total) by mouth daily with breakfast. 08/22/20   Lesleigh Noe, MD  folic acid (FOLVITE) 1 MG tablet Take 1 mg by mouth daily. 12/27/19   [provider]  gabapentin (NEURONTIN) 100 MG capsule Take by mouth 3 (three) times daily as needed. 04/05/20   [provider]  GAVILAX 17 GM/SCOOP powder Take by mouth. 01/06/20   [provider]  glucagon 1 MG injection Inject 1 mg into the skin once as needed. For low blood sugar 12/13/15   [provider]  hydrocortisone (ANUSOL-HC) 25 MG suppository Place 1 suppository (25 mg total) rectally 2 (two) times daily. 09/18/20   Lesleigh Noe, MD  insulin glargine (LANTUS SOLOSTAR) 100 UNIT/ML Solostar Pen If pump failure take 20 units 03/30/20   [provider]  insulin glulisine (APIDRA) 100 UNIT/ML injection Inject via insulin pump up to 60 units daily for T1DM 04/05/20   [provider]  Insulin Human (INSULIN PUMP) SOLN Inject into the skin continuous. Insulin glulisine (Apidra) 100 Units/mL    [provider]  linaclotide (LINZESS) 290 MCG CAPS capsule Take 290 mcg by mouth daily.     [provider]  loratadine (CLARITIN) 10 MG tablet Take 1 tablet (10 mg total)  by mouth daily. 08/22/20   Lesleigh Noe, MD  meloxicam (MOBIC) 15 MG tablet Take 1 tablet by mouth daily. 08/02/20   [provider]  meloxicam (MOBIC) 7.5 MG tablet Take 7.5 mg by mouth daily. 06/25/20   [provider]  methotrexate (RHEUMATREX) 2.5 MG tablet Take by mouth. Take 5 tablets (12.5 mg total) by mouth once a week 03/09/20   [provider]  metoCLOPramide (REGLAN) 5 MG tablet Take 5 mg by mouth 3 (three) times daily as needed. 10/15/20   [provider]  Multiple Vitamin (MULTI-VITAMINS) TABS Take 1 tablet by mouth daily.    [provider]  nitrofurantoin, macrocrystal-monohydrate, (MACROBID) 100 MG capsule Take 1 capsule (100 mg total) by mouth daily as needed (take around the time of sexual activity to prevent UTIs). 12/05/20   Billey Co, MD  ondansetron (ZOFRAN ODT) 4 MG disintegrating tablet Take 1 tablet (4 mg total) by mouth every 8 (eight) hours as needed for nausea or vomiting. 01/09/18   Leaphart, Zack Seal, PA-C  ONETOUCH ULTRA test strip  TEST 4 TO 6 TIMES DAILY AS DIRECTED 11/20/20   [provider]  sulfaSALAzine (AZULFIDINE) 500 MG tablet Take 500 mg by mouth at bedtime. 11/14/20   [provider]  SUMAtriptan (IMITREX) 100 MG tablet Take 100 mg by mouth as directed. 05/14/20   [provider]  triamcinolone acetonide (TRIESENCE) 40 MG/ML SUSP Inject into the articular space. 11/25/20   [provider]  Urine Glucose-Ketones Test [facility]) STRP Use to check urine for ketones with high blood glucose 06/16/17   [provider]  valACYclovir (VALTREX) 1000 MG tablet Take by mouth as needed. 04/08/20   [provider]  zolpidem (AMBIEN CR) 12.5 MG CR tablet Take 1 tablet (12.5 mg total) by mouth at bedtime as needed for sleep. 07/11/13   Servando [place], MD    Family History Family History  Problem Relation Age of Onset   Diabetes Sister    Arthritis Sister    Hyperlipidemia Sister    Lung cancer Mother    Cancer Mother        ?Uterine cancer   Alzheimer's disease Maternal Grandmother     Social History Social History   Tobacco Use   Smoking status: Never   Smokeless tobacco: Never  Vaping Use   Vaping Use: Never used  Substance Use Topics   Alcohol use: No   Drug use: No     Allergies   Morphine, Duloxetine hcl, Gabapentin, Morphine and related, and Tramadol   Review of Systems Review of Systems Per HPI  Physical Exam Triage Vital Signs ED Triage Vitals  Enc Vitals Group     BP 05/09/21 1211 139/80     Pulse Rate 05/09/21 1211 96     Resp 05/09/21 1211 18     Temp 05/09/21 1211 (!) 97.5 F (36.4 C)     Temp Source 05/09/21 1211 Temporal     SpO2 05/09/21 1211 96 %     Weight --      Height --      Head Circumference --      Peak Flow --      Pain Score 05/09/21 1213 4     Pain Loc --      Pain Edu? --      Excl. in [place]? --    No data found.  Updated Vital Signs BP 139/80 (BP Location: Left Arm)   Pulse 96  Temp (!) 97.5 F  (36.4 C) (Temporal)   Resp 18   SpO2 96%   Visual Acuity Right Eye Distance:   Left Eye Distance:   Bilateral Distance:    Right Eye Near:   Left Eye Near:    Bilateral Near:     Physical Exam Constitutional:      General: She is not in acute distress.    Appearance: Normal appearance. She is not toxic-appearing or diaphoretic.  HENT:     Head: Normocephalic and atraumatic.     Right Ear: Tympanic membrane and ear canal normal.     Left Ear: Tympanic membrane and ear canal normal.     Nose: Nose normal.     Mouth/Throat:     Mouth: Mucous membranes are moist.     Pharynx: No posterior oropharyngeal erythema.  Eyes:     Extraocular Movements: Extraocular movements intact.     Conjunctiva/sclera: Conjunctivae normal.     Pupils: Pupils are equal, round, and reactive to light.  Cardiovascular:     Rate and Rhythm: Normal rate and regular rhythm.     Pulses: Normal pulses.     Heart sounds: Normal heart sounds.  Pulmonary:     Effort: Pulmonary effort is normal. No respiratory distress.     Breath sounds: Normal breath sounds. No stridor. No wheezing or rhonchi.  Chest:     Chest wall: No tenderness.  Abdominal:     General: Abdomen is flat. Bowel sounds are normal. There is no distension.     Palpations: Abdomen is soft.     Tenderness: There is no abdominal tenderness.  Skin:    General: Skin is warm and dry.  Neurological:     General: No focal deficit present.     Mental Status: She is alert and oriented to person, place, and time. Mental status is at baseline.     Cranial Nerves: Cranial nerves are intact.     Sensory: Sensation is intact.     Motor: Motor function is intact.     Coordination: Coordination is intact.     Gait: Gait is intact.     Deep Tendon Reflexes: Reflexes are normal and symmetric.  Psychiatric:        Mood and Affect: Mood normal.        Behavior: Behavior normal.        Thought Content: Thought content normal.        Judgment: Judgment  normal.     UC Treatments / Results  Labs (all labs ordered are listed, but only abnormal results are displayed) Labs Reviewed  BASIC METABOLIC PANEL  CBC  C-REACTIVE PROTEIN    EKG   Radiology DG Chest 2 View  Result Date: 05/09/2021 CLINICAL DATA:  Chest pain EXAM: CHEST - 2 VIEW COMPARISON:  02/11/2017 FINDINGS: Cardiac and mediastinal contours normal. Lungs clear without infiltrate or effusion Bilateral cervical ribs are noted, unchanged. IMPRESSION: No active cardiopulmonary disease. Electronically Signed   By: Franchot Gallo M.D.   On: 05/09/2021 13:16    Procedures Procedures (including critical [name] time)  Medications Ordered in UC Medications - No data to display  Initial Impression / Assessment and Plan / UC Course  I have reviewed the triage vital signs and the nursing notes.  Pertinent labs & imaging results that were available during my [name] of the patient were reviewed by me and considered in my medical decision making (see chart for details).     EKG was unremarkable  for chest pain.  Do not think chest pain is a cardiac etiology given location of chest pain and mechanism.  Chest pain is located on the right side.  Chest x-ray was unremarkable as well.  Suspect that patient has musculoskeletal pain given patient's chronic illnesses and arthritis.  Patient to use NSAIDs as needed for pain.  CBC, BMP, CRP pending.  Will prescribe albuterol to help alleviate chest tightness due to appearance of lungs on chest x-ray.  Fever monitoring and management discussed with patient.  Advised patient to go to the hospital if symptoms significantly worsen. No red flags on exam.  Patient is not in immediate need of medical attention at this time. Discussed clinical course with supervising physician (Dr. Lanny Cramp). Discussed strict return precautions. Patient verbalized understanding and is agreeable with plan.  Final Clinical Impressions(s) / UC Diagnoses   Final diagnoses:  Fever,  unspecified  Other chest pain  Arthralgia of both lower legs     Discharge Instructions      Your EKG was unremarkable.  Your chest x-ray was normal.  You have been prescribed albuterol inhaler to help alleviate chest tightness.  Please also take meloxicam as needed for pain and inflammation.  Follow-up with primary [name] soon as possible for further evaluation and management.  Blood work is pending.  We will call if there are any abnormalities.     ED Prescriptions     Medication Sig Dispense Auth. Provider   albuterol (VENTOLIN HFA) 108 (90 Base) MCG/ACT inhaler Inhale 1-2 puffs into the lungs every 6 (six) hours as needed for wheezing or shortness of breath. 1 each Odis Luster, FNP      PDMP not reviewed this encounter.   Odis Luster, [place] 05/09/21 1422

## 2021-05-09 NOTE — ED Triage Notes (Signed)
Pt c/o fever and chest tightness, headache, constipation. States her chest discomfort and knee pain is arthritis.   Denies cough, sore throat, nausea, vomiting, diarrhea.

## 2021-05-09 NOTE — Telephone Encounter (Signed)
Appreciate triage support and agree with plan. 

## 2021-05-10 LAB — C-REACTIVE PROTEIN: CRP: 14 mg/L — ABNORMAL HIGH (ref 0–10)

## 2021-05-10 LAB — CBC
Hematocrit: 39.2 % (ref 34.0–46.6)
Hemoglobin: 12.9 g/dL (ref 11.1–15.9)
MCH: 28.9 pg (ref 26.6–33.0)
MCHC: 32.9 g/dL (ref 31.5–35.7)
MCV: 88 fL (ref 79–97)
Platelets: 300 10*3/uL (ref 150–450)
RBC: 4.46 x10E6/uL (ref 3.77–5.28)
RDW: 13.9 % (ref 11.7–15.4)
WBC: 6.5 10*3/uL (ref 3.4–10.8)

## 2021-05-10 LAB — BASIC METABOLIC PANEL
BUN/Creatinine Ratio: 18 (ref 9–23)
BUN: 14 mg/dL (ref 6–24)
CO2: 25 mmol/L (ref 20–29)
Calcium: 9.3 mg/dL (ref 8.7–10.2)
Chloride: 102 mmol/L (ref 96–106)
Creatinine, Ser: 0.76 mg/dL (ref 0.57–1.00)
Glucose: 195 mg/dL — ABNORMAL HIGH (ref 70–99)
Potassium: 4.4 mmol/L (ref 3.5–5.2)
Sodium: 140 mmol/L (ref 134–144)
eGFR: 95 mL/min/{1.73_m2} (ref >59)

## 2021-05-13 DIAGNOSIS — N921 Excessive and frequent menstruation with irregular cycle: Secondary | ICD-10-CM | POA: Diagnosis not present

## 2021-05-13 DIAGNOSIS — D251 Intramural leiomyoma of uterus: Secondary | ICD-10-CM | POA: Diagnosis not present

## 2021-05-13 DIAGNOSIS — D252 Subserosal leiomyoma of uterus: Secondary | ICD-10-CM | POA: Diagnosis not present

## 2021-05-20 DIAGNOSIS — E1043 Type 1 diabetes mellitus with diabetic autonomic (poly)neuropathy: Secondary | ICD-10-CM | POA: Diagnosis not present

## 2021-05-27 DIAGNOSIS — N939 Abnormal uterine and vaginal bleeding, unspecified: Secondary | ICD-10-CM | POA: Diagnosis not present

## 2021-05-27 DIAGNOSIS — N920 Excessive and frequent menstruation with regular cycle: Secondary | ICD-10-CM | POA: Diagnosis not present

## 2021-06-24 ENCOUNTER — Other Ambulatory Visit: Payer: Self-pay

## 2021-06-24 ENCOUNTER — Encounter: Payer: Self-pay | Admitting: Podiatry

## 2021-06-24 ENCOUNTER — Ambulatory Visit (INDEPENDENT_AMBULATORY_CARE_PROVIDER_SITE_OTHER): Payer: Medicare Other | Admitting: Podiatry

## 2021-06-24 ENCOUNTER — Ambulatory Visit (INDEPENDENT_AMBULATORY_CARE_PROVIDER_SITE_OTHER): Payer: Medicare Other

## 2021-06-24 DIAGNOSIS — S90859A Superficial foreign body, unspecified foot, initial encounter: Secondary | ICD-10-CM

## 2021-06-24 NOTE — Progress Notes (Signed)
Subjective:   Patient ID: Tiffany [name], female   DOB: 50 y.o.   MRN: [id number]   HPI Patient presents stating she stepped on something several months ago on her left foot and its sore.  She has not noticed drainage or redness but she is a diabetic not in good control and it makes her nervous and has had diabetes 28 years and only recently has started to try to control it.  Patient states it is sore and there is a lesion there.  Patient does not smoke likes to be active   Review of Systems  All other systems reviewed and are negative.      Objective:  Physical Exam Vitals and nursing note reviewed.  Constitutional:      Appearance: She is well-developed.  Pulmonary:     Effort: Pulmonary effort is normal.  Musculoskeletal:        General: Normal range of motion.  Skin:    General: Skin is warm.  Neurological:     Mental Status: She is alert.    Neurovascular status intact muscle strength was found to be adequate range of motion adequate.  There is a keratotic lesion underneath the left fifth metatarsal there is no erythema no edema no drainage or other pathology associated with it and no open area     Assessment:  Possibility for foreign body or other inclusion cyst formation secondary to trauma of several months ago     Plan:  H&P x-ray reviewed and today sterile sharp debridement of lesion accomplished I explored the area I did not see any foreign body I did not have to open it up completely and I applied sterile dressing with padding and advised on gradual increase in activities and weightbearing.  Patient will be seen back to recheck if any issues were to occur she is to contact us immediately if any redness swelling or systemic signs of infection were to occur she is to go straight to the emergency room  X-ray was negative for signs of foreign body or other pathology

## 2021-06-26 ENCOUNTER — Ambulatory Visit: Payer: BC Managed Care – PPO | Admitting: Primary Care

## 2021-07-13 DIAGNOSIS — E1065 Type 1 diabetes mellitus with hyperglycemia: Secondary | ICD-10-CM | POA: Diagnosis not present

## 2021-07-15 DIAGNOSIS — Z794 Long term (current) use of insulin: Secondary | ICD-10-CM | POA: Diagnosis not present

## 2021-07-15 DIAGNOSIS — E109 Type 1 diabetes mellitus without complications: Secondary | ICD-10-CM | POA: Diagnosis not present

## 2021-07-15 DIAGNOSIS — E1043 Type 1 diabetes mellitus with diabetic autonomic (poly)neuropathy: Secondary | ICD-10-CM | POA: Diagnosis not present

## 2021-07-15 DIAGNOSIS — E1065 Type 1 diabetes mellitus with hyperglycemia: Secondary | ICD-10-CM | POA: Diagnosis not present

## 2021-07-19 DIAGNOSIS — E1065 Type 1 diabetes mellitus with hyperglycemia: Secondary | ICD-10-CM | POA: Diagnosis not present

## 2021-07-19 DIAGNOSIS — E109 Type 1 diabetes mellitus without complications: Secondary | ICD-10-CM | POA: Diagnosis not present

## 2021-07-19 DIAGNOSIS — Z79899 Other long term (current) drug therapy: Secondary | ICD-10-CM | POA: Diagnosis not present

## 2021-07-19 DIAGNOSIS — Z794 Long term (current) use of insulin: Secondary | ICD-10-CM | POA: Diagnosis not present

## 2021-07-19 DIAGNOSIS — Z79631 Long term (current) use of antimetabolite agent: Secondary | ICD-10-CM | POA: Diagnosis not present

## 2021-07-19 DIAGNOSIS — M0609 Rheumatoid arthritis without rheumatoid factor, multiple sites: Secondary | ICD-10-CM | POA: Diagnosis not present

## 2021-07-19 DIAGNOSIS — M2550 Pain in unspecified joint: Secondary | ICD-10-CM | POA: Diagnosis not present

## 2021-08-01 ENCOUNTER — Other Ambulatory Visit: Payer: Self-pay

## 2021-08-01 ENCOUNTER — Telehealth (INDEPENDENT_AMBULATORY_CARE_PROVIDER_SITE_OTHER): Payer: BC Managed Care – PPO | Admitting: Family Medicine

## 2021-08-01 ENCOUNTER — Encounter: Payer: Self-pay | Admitting: Family Medicine

## 2021-08-01 VITALS — Temp 98.9°F

## 2021-08-01 DIAGNOSIS — R0981 Nasal congestion: Secondary | ICD-10-CM

## 2021-08-01 MED ORDER — BENZONATATE 100 MG PO CAPS: ORAL_CAPSULE | ORAL | 0 refills | Status: DC

## 2021-08-01 MED ORDER — DOXYCYCLINE HYCLATE 100 MG PO TABS: 100.0000 mg | ORAL_TABLET | Freq: Two times a day (BID) | ORAL | 0 refills | Status: DC

## 2021-08-01 NOTE — Patient Instructions (Signed)
°  HOME CARE TIPS:  -COVID19 testing information: [email or web address].[email or web address]  Most pharmacies also offer testing and home test kits. If the Covid19 test is positive and you desire antiviral treatment, please contact a [facility] or schedule a follow up virtual visit through your primary care office or through the [facility].  Other test to treat options: ConnectRV.is?[email or web address]  -I sent the medication(s) we discussed to your pharmacy: Meds ordered this encounter  Medications   doxycycline (VIBRA-TABS) 100 MG tablet    Sig: Take 1 tablet (100 mg total) by mouth 2 (two) times daily.    Dispense:  20 tablet    Refill:  0   benzonatate (TESSALON PERLES) 100 MG capsule    Sig: 1-2 capsules up to twice daily as needed for cough    Dispense:  30 capsule    Refill:  0     -can use nasal saline a few times per day if you have nasal congestion  -stay hydrated, drink plenty of fluids and eat small healthy meals - avoid dairy  It was nice to meet you today, and I really hope you are feeling better soon. I help [facility] out with telemedicine visits on Tuesdays and Thursdays and am happy to help if you need a follow up virtual visit on those days. Otherwise, if you have any concerns or questions following this visit please schedule a follow up visit with your Primary Care doctor or seek care at a local urgent care clinic to avoid delays in care.    Seek in person care or schedule a follow up video visit promptly if your symptoms worsen, new concerns arise or you are not improving with treatment. Call 911 and/or seek emergency care if your symptoms are severe or life threatening.

## 2021-08-01 NOTE — Progress Notes (Signed)
Virtual Visit via Video Note  I connected with [place]  on 08/01/21 at  5:20 PM EST by a video enabled telemedicine application and verified that I am speaking with the correct person using two identifiers.  Location patient: [place] Location provider:work or home office Persons participating in the virtual visit: patient, provider  I discussed the limitations and requested verbal permission for telemedicine visit. The patient expressed understanding and agreed to proceed.   HPI:  Acute telemedicine visit for cough and sinus congestion: -Onset: 15 days - was improving but worse again the last few days -covid testing was negative -Symptoms include: nasal congestion, cough, ear ache, now with some sinus discomfort and lots of sinus drainage - thick now -Denies: fever, CP (outside of usual), SOB, NVD -Pertinent past medical history: see below  -Pertinent medication allergies: Allergies  Allergen Reactions   Morphine Itching and Hives   Duloxetine Hcl     Other reaction(s): constipation   Gabapentin     Leg Pain   Morphine And Related Hives and Itching   Tramadol Hives and Itching  -COVID-19 vaccine status:  Immunization History  Administered Date(s) Administered   Influenza Split 08/03/2013, 07/20/2015, 05/20/2016   Influenza,inj,Quad PF,6+ Mos 06/10/2017   Influenza,inj,quad, With Preservative 06/26/2014, 07/02/2015   Moderna Sars-Covid-2 Vaccination 10/14/2019, 11/15/2019, 07/12/2020   PPD Test 05/05/2011, 02/14/2013   Pneumococcal Conjugate-13 03/28/2010   Pneumococcal Polysaccharide-23 02/12/2009   Tdap 06/09/2016     ROS: See pertinent positives and negatives per HPI.  Past Medical History:  Diagnosis Date   Arthritis    knees, lower back right side   Connective tissue disease ([place])    Depression    Diabetes mellitus    Gastroparesis    Headache(784.0)    Heart murmur    as a child - no problem as adult   History of kidney stones    no surgery   Hyperlipidemia     Lupus ([place])     Past Surgical History:  Procedure Laterality Date   CESAREAN SECTION  1994, 2006   x 2   COLONOSCOPY WITH PROPOFOL N/A 03/16/2013   Procedure: COLONOSCOPY WITH PROPOFOL;  Surgeon: Arta Silence, MD;  Location: [facility];  Service: Endoscopy;  Laterality: N/A;   HYSTEROSCOPY WITH D & C N/A 07/11/2013   Procedure: DILATATION AND CURETTAGE /HYSTEROSCOPY;  Surgeon: Marvene Staff, MD;  Location: [facility];  Service: Gynecology;  Laterality: N/A;   left surgery shoulder     ROBOTIC ASSISTED LAPAROSCOPIC LYSIS OF ADHESION N/A 07/11/2013   Procedure: ROBOTIC ASSISTED LAPAROSCOPIC LYSIS OF ADHESIONS ; REPAIR OF UTERINE DEHISENCE;  Surgeon: Marvene Staff, MD;  Location: [facility];  Service: Gynecology;  Laterality: N/A;   TUBAL LIGATION       Current Outpatient Medications:    albuterol (VENTOLIN HFA) 108 (90 Base) MCG/ACT inhaler, Inhale 1-2 puffs into the lungs every 6 (six) hours as needed for wheezing or shortness of breath., Disp: 1 each, Rfl: 0   aluminum chloride (DRYSOL) 20 % external solution, Apply topically every other day. For sweating., Disp: 35 mL, Rfl: 0   APIDRA SOLOSTAR 100 UNIT/ML Solostar Pen, Inject into the skin., Disp: , Rfl:    atorvastatin (LIPITOR) 40 MG tablet, Take 1 tablet (40 mg total) by mouth every morning., Disp: 90 tablet, Rfl: 3   azelastine (ASTELIN) 0.1 % nasal spray, Place into both nostrils., Disp: , Rfl:    benzonatate (TESSALON PERLES) 100 MG capsule, 1-2 capsules up to twice daily as needed  for cough, Disp: 30 capsule, Rfl: 0   bisacodyl (DULCOLAX) 5 MG EC tablet, Take by mouth as needed., Disp: , Rfl:    Blood Glucose Monitoring Suppl (GLUCOCOM BLOOD GLUCOSE MONITOR) DEVI, Use to test blood sugar 4-6 times daily, Disp: , Rfl:    Blood Glucose Monitoring Suppl (ONE TOUCH ULTRA 2) w/Device KIT, SMARTSIG:1 Each Via Meter As Directed, Disp: , Rfl:    bupivacaine (MARCAINE) 0.25 % injection, Inject into the skin., Disp: , Rfl:     Continuous Blood Gluc Receiver ([place]) DEVI, 1 each by Misc.(Non-Drug; Combo Route) route daily., Disp: , Rfl:    Continuous Blood Gluc Sensor (DEXCOM G6 SENSOR) MISC, Apply 1 sensor to the skin every 10 days for continuous glucose monitoring., Disp: , Rfl:    Continuous Blood Gluc Transmit (DEXCOM G6 TRANSMITTER) MISC, Use as directed for continuous glucose monitoring. Reuse transmitter for 90 days then discard and replace., Disp: , Rfl:    cyclobenzaprine (FLEXERIL) 10 MG tablet, Take 10 mg by mouth 3 (three) times daily as needed. Muscle spasms, Disp: , Rfl: 2   diclofenac (VOLTAREN) 50 MG EC tablet, Take 50 mg by mouth 2 (two) times daily., Disp: , Rfl:    doxycycline (VIBRA-TABS) 100 MG tablet, Take 1 tablet (100 mg total) by mouth 2 (two) times daily., Disp: 20 tablet, Rfl: 0   ferrous sulfate 325 (65 FE) MG tablet, Take 1 tablet (325 mg total) by mouth daily with breakfast., Disp: 90 tablet, Rfl: 1   folic acid (FOLVITE) 1 MG tablet, Take 1 mg by mouth daily., Disp: , Rfl:    gabapentin (NEURONTIN) 100 MG capsule, Take by mouth 3 (three) times daily as needed., Disp: , Rfl:    GAVILAX 17 GM/SCOOP powder, Take by mouth., Disp: , Rfl:    glucagon 1 MG injection, Inject 1 mg into the skin once as needed. For low blood sugar, Disp: , Rfl:    hydrocortisone (ANUSOL-HC) 25 MG suppository, Place 1 suppository (25 mg total) rectally 2 (two) times daily., Disp: 12 suppository, Rfl: 0   insulin glargine (LANTUS SOLOSTAR) 100 UNIT/ML Solostar Pen, If pump failure take 20 units, Disp: , Rfl:    insulin glulisine (APIDRA) 100 UNIT/ML injection, Inject via insulin pump up to 60 units daily for T1DM, Disp: , Rfl:    Insulin Human (INSULIN PUMP) SOLN, Inject into the skin continuous. Insulin glulisine (Apidra) 100 Units/mL, Disp: , Rfl:    linaclotide (LINZESS) 290 MCG CAPS capsule, Take 290 mcg by mouth daily. , Disp: , Rfl:    loratadine (CLARITIN) 10 MG tablet, Take 1 tablet (10 mg total) by  mouth daily., Disp: 90 tablet, Rfl: 3   meloxicam (MOBIC) 15 MG tablet, Take 1 tablet by mouth daily., Disp: , Rfl:    meloxicam (MOBIC) 7.5 MG tablet, Take 7.5 mg by mouth daily., Disp: , Rfl:    methotrexate (RHEUMATREX) 2.5 MG tablet, Take by mouth. Take 5 tablets (12.5 mg total) by mouth once a week, Disp: , Rfl:    metoCLOPramide (REGLAN) 5 MG tablet, Take 5 mg by mouth 3 (three) times daily as needed., Disp: , Rfl:    Multiple Vitamin (MULTI-VITAMINS) TABS, Take 1 tablet by mouth daily., Disp: , Rfl:    nitrofurantoin, macrocrystal-monohydrate, (MACROBID) 100 MG capsule, Take 1 capsule (100 mg total) by mouth daily as needed (take around the time of sexual activity to prevent UTIs)., Disp: 30 capsule, Rfl: 6   ondansetron (ZOFRAN ODT) 4 MG disintegrating tablet,  Take 1 tablet (4 mg total) by mouth every 8 (eight) hours as needed for nausea or vomiting., Disp: 6 tablet, Rfl: 0   ONETOUCH ULTRA test strip, TEST 4 TO 6 TIMES DAILY AS DIRECTED, Disp: , Rfl:    sulfaSALAzine (AZULFIDINE) 500 MG tablet, Take 500 mg by mouth at bedtime., Disp: , Rfl:    SUMAtriptan (IMITREX) 100 MG tablet, Take 100 mg by mouth as directed., Disp: , Rfl:    triamcinolone acetonide (TRIESENCE) 40 MG/ML SUSP, Inject into the articular space., Disp: , Rfl:    Urine Glucose-Ketones Test (KETO-DIASTIX) STRP, Use to check urine for ketones with high blood glucose, Disp: , Rfl:    valACYclovir (VALTREX) 1000 MG tablet, Take by mouth as needed., Disp: , Rfl:    zolpidem (AMBIEN CR) 12.5 MG CR tablet, Take 1 tablet (12.5 mg total) by mouth at bedtime as needed for sleep., Disp: 30 tablet, Rfl: 0  EXAM:  VITALS per patient if applicable:  GENERAL: alert, oriented, appears well and in no acute distress  HEENT: atraumatic, conjunttiva clear, no obvious abnormalities on inspection of external nose and ears  NECK: normal movements of the head and neck  LUNGS: on inspection no signs of respiratory distress, breathing rate  appears normal, no obvious gross SOB, gasping or wheezing  CV: no obvious cyanosis  MS: moves all visible extremities without noticeable abnormality  PSYCH/NEURO: pleasant and cooperative, no obvious depression or anxiety, speech and thought processing grossly intact  ASSESSMENT AND PLAN:  Discussed the following assessment and plan:  Nasal sinus congestion  -we discussed possible serious and likely etiologies, options for evaluation and workup, limitations of telemedicine visit vs in person visit, treatment, treatment risks and precautions. Pt is agreeable to treatment via telemedicine at this moment. Query developing sinusitis vs other. She has opted to try empiric doxy 151m bid x 10 days and tessalon rx for cough. Advised retesting for covid as well in case of new infection and made aware can contact [facility] for antiviral if positive.  Advised to seek prompt virtual visit or in person care if worsening, new symptoms arise, or if is not improving with treatment as expected per our conversation of expected course. Discussed options for follow up care. Did let this patient know that I do telemedicine on Tuesdays and Thursdays for  and those are the days I am logged into the system. Advised to schedule follow up visit with PCP, [facility] [place] virtual visits or [facility] if any further questions or concerns to avoid delays in care.   I discussed the assessment and treatment plan with the patient. The patient was provided an opportunity to ask questions and all were answered. The patient agreed with the plan and demonstrated an understanding of the instructions.     HLucretia Kern DO

## 2021-08-06 DIAGNOSIS — E1043 Type 1 diabetes mellitus with diabetic autonomic (poly)neuropathy: Secondary | ICD-10-CM | POA: Diagnosis not present

## 2021-08-06 DIAGNOSIS — Z9641 Presence of insulin pump (external) (internal): Secondary | ICD-10-CM | POA: Diagnosis not present

## 2021-08-06 DIAGNOSIS — K3184 Gastroparesis: Secondary | ICD-10-CM | POA: Diagnosis not present

## 2021-08-06 DIAGNOSIS — E1065 Type 1 diabetes mellitus with hyperglycemia: Secondary | ICD-10-CM | POA: Diagnosis not present

## 2021-08-24 ENCOUNTER — Other Ambulatory Visit: Payer: Self-pay | Admitting: Family Medicine

## 2021-08-26 NOTE — Telephone Encounter (Signed)
Per Dr. Verda Cumins note in February 2022, it was recommended she discontinue iron tablets. I reviewed her CBC from December 2022 which was normal.  Has she been taking the oral iron tablets?  If not, then I recommend she remain off given CBC results. If so then let me know how long she has been taking them.

## 2021-08-26 NOTE — Telephone Encounter (Signed)
Ferrous sulfate last filled on 08/22/20 #90 with 1 refill LOV 11/21/20 Dysuria. No future appointments.

## 2021-09-10 DIAGNOSIS — D509 Iron deficiency anemia, unspecified: Secondary | ICD-10-CM | POA: Diagnosis not present

## 2021-09-10 DIAGNOSIS — K5901 Slow transit constipation: Secondary | ICD-10-CM | POA: Diagnosis not present

## 2021-09-23 ENCOUNTER — Other Ambulatory Visit: Payer: Self-pay

## 2021-09-23 ENCOUNTER — Ambulatory Visit (INDEPENDENT_AMBULATORY_CARE_PROVIDER_SITE_OTHER): Payer: BC Managed Care – PPO | Admitting: Family Medicine

## 2021-09-23 VITALS — BP 120/68 | HR 95 | Temp 97.9°F | Ht 66.0 in | Wt 148.2 lb

## 2021-09-23 DIAGNOSIS — M545 Low back pain, unspecified: Secondary | ICD-10-CM

## 2021-09-23 DIAGNOSIS — G8929 Other chronic pain: Secondary | ICD-10-CM | POA: Diagnosis not present

## 2021-09-23 DIAGNOSIS — B359 Dermatophytosis, unspecified: Secondary | ICD-10-CM | POA: Diagnosis not present

## 2021-09-23 DIAGNOSIS — L659 Nonscarring hair loss, unspecified: Secondary | ICD-10-CM | POA: Insufficient documentation

## 2021-09-23 DIAGNOSIS — E103299 Type 1 diabetes mellitus with mild nonproliferative diabetic retinopathy without macular edema, unspecified eye: Secondary | ICD-10-CM

## 2021-09-23 DIAGNOSIS — Z1231 Encounter for screening mammogram for malignant neoplasm of breast: Secondary | ICD-10-CM | POA: Diagnosis not present

## 2021-09-23 MED ORDER — CLOTRIMAZOLE 1 % EX OINT: 1.0000 "application " | TOPICAL_OINTMENT | Freq: Two times a day (BID) | CUTANEOUS | 0 refills | Status: DC

## 2021-09-23 NOTE — Patient Instructions (Signed)
#  Back pain - continue aleve - can try flexeril as needed - physical therapy - if no improvement after 6 weeks - follow-up with me or with Dr. Lorelei Pont  #Hair loss - labs today - if normal  - discuss with dermatology - Biotin supplement if no cause  #Skin changes - Clotrimazole twice daily - dermatology  If unable to see your previous [facility] Dermatologist - call or mychart and will place new referral

## 2021-09-23 NOTE — Assessment & Plan Note (Signed)
Seems MSK related. She has never tried PT - advised a trial of this. Completed by RA and chronic constipation but do feel therapy will be helpful. Cont prn aleve and flexeril.

## 2021-09-23 NOTE — Assessment & Plan Note (Signed)
Etiology unclear. May be 2/2 to methotrexate. Labs today to r/o other causes. If work-up negative trial of biotin and f/u with rheumatology to discuss possible side effect.

## 2021-09-23 NOTE — Assessment & Plan Note (Signed)
Given appearance will treat for fungal, however, pt with dermatologist and advised f/u if no improvement.

## 2021-09-23 NOTE — Assessment & Plan Note (Signed)
Has pump, reports she is doing well. Labs today. Cont insulin

## 2021-09-23 NOTE — Progress Notes (Signed)
Subjective:     Tiffany [name] is a 51 y.o. female presenting for Annual Exam     HPI  #Hair loss - head - noticed 2 weeks ago - washed her hair a lot came out - no dry skin or nail changes - weight gain - no new fatigue - endorses some chest tightness 2/2 to methotrexate  #Back pain - chronic and recurrent - not sure if this is related to her RA or not - was on several medications in the past - right lower back  - no shooting leg pain - Treatment: aleve   #skin changes - hx of having biospies in the past - no cause  - returning - previous - toe, knee, thigh - recurrence - in the axilla, painful and itchy - treatment - stopped her immune therapy medication, used a cream that was ineffective, does not think she saw dermatology - will get intermittent stabbing pain at the skin lesion  #diabetes is doing well - using the pump doing better   Review of Systems   Social History   Tobacco Use  Smoking Status Never  Smokeless Tobacco Never        Objective:    BP Readings from Last 3 Encounters:  09/23/21 120/68  05/09/21 139/80  12/05/20 121/74   Wt Readings from Last 3 Encounters:  09/23/21 148 lb 4 oz (67.2 kg)  12/05/20 150 lb (68 kg)  11/21/20 151 lb (68.5 kg)    BP 120/68    Pulse 95    Temp 97.9 F (36.6 C) (Oral)    Ht 5\' 6"  (1.676 m)    Wt 148 lb 4 oz (67.2 kg)    LMP 09/13/2021    SpO2 100%    BMI 23.93 kg/m    Physical Exam Constitutional:      General: She is not in acute distress.    Appearance: She is well-developed. She is not diaphoretic.  HENT:     Right Ear: External ear normal.     Left Ear: External ear normal.  Eyes:     Conjunctiva/sclera: Conjunctivae normal.  Cardiovascular:     Rate and Rhythm: Normal rate and regular rhythm.  Pulmonary:     Effort: Pulmonary effort is normal. No respiratory distress.     Breath sounds: Normal breath sounds. No wheezing.  Musculoskeletal:     Cervical back: Neck supple.      Comments: Back Inspection: no erythema, no deformity Palpation: TTP along the right and left lower thoracic/lumbar Paraspinous muscles ROM: normal w/ some pain Strength: normal  Straight leg raise - negative but she has generalized pain   Skin:    General: Skin is warm and dry.     Capillary Refill: Capillary refill takes less than 2 seconds.     Comments: Left axilla with 2-3 ring like macular lesions with central clearing and dark border.   Neurological:     Mental Status: She is alert. Mental status is at baseline.  Psychiatric:        Mood and Affect: Mood normal.        Behavior: Behavior normal.          Assessment & Plan:   Problem List Items Addressed This Visit       Endocrine   Diabetic retinopathy associated with type 1 diabetes mellitus ([place])    Has pump, reports she is doing well. Labs today. Cont insulin      Relevant Orders   Comprehensive  metabolic panel   Hemoglobin A1c   Lipid panel     Musculoskeletal and Integument   Tinea    Given appearance will treat for fungal, however, pt with dermatologist and advised f/u if no improvement.       Relevant Medications   Clotrimazole 1 % OINT     Other   Hair loss - Primary    Etiology unclear. May be 2/2 to methotrexate. Labs today to r/o other causes. If work-up negative trial of biotin and f/u with rheumatology to discuss possible side effect.       Relevant Orders   CBC   Ferritin   TSH   Chronic right-sided low back pain without sciatica    Seems MSK related. She has never tried PT - advised a trial of this. Completed by RA and chronic constipation but do feel therapy will be helpful. Cont prn aleve and flexeril.       Relevant Orders   Ambulatory referral to Physical Therapy     Return in about 8 weeks (around 11/18/2021) for annual.  Lesleigh Noe, MD  This visit occurred during the SARS-CoV-2 public health emergency.  Safety protocols were in place, including screening questions prior  to the visit, additional usage of staff PPE, and extensive cleaning of exam room while observing appropriate contact time as indicated for disinfecting solutions.

## 2021-09-24 ENCOUNTER — Other Ambulatory Visit: Payer: BC Managed Care – PPO

## 2021-09-30 ENCOUNTER — Encounter: Payer: Self-pay | Admitting: *Deleted

## 2021-10-08 DIAGNOSIS — E109 Type 1 diabetes mellitus without complications: Secondary | ICD-10-CM | POA: Diagnosis not present

## 2021-10-08 DIAGNOSIS — E1065 Type 1 diabetes mellitus with hyperglycemia: Secondary | ICD-10-CM | POA: Diagnosis not present

## 2021-10-08 DIAGNOSIS — E1043 Type 1 diabetes mellitus with diabetic autonomic (poly)neuropathy: Secondary | ICD-10-CM | POA: Diagnosis not present

## 2021-10-08 DIAGNOSIS — Z794 Long term (current) use of insulin: Secondary | ICD-10-CM | POA: Diagnosis not present

## 2021-10-09 DIAGNOSIS — E1065 Type 1 diabetes mellitus with hyperglycemia: Secondary | ICD-10-CM | POA: Diagnosis not present

## 2021-10-09 DIAGNOSIS — E109 Type 1 diabetes mellitus without complications: Secondary | ICD-10-CM | POA: Diagnosis not present

## 2021-10-09 DIAGNOSIS — Z794 Long term (current) use of insulin: Secondary | ICD-10-CM | POA: Diagnosis not present

## 2021-10-15 DIAGNOSIS — N644 Mastodynia: Secondary | ICD-10-CM | POA: Diagnosis not present

## 2021-10-16 ENCOUNTER — Telehealth: Payer: Self-pay | Admitting: Hematology and Oncology

## 2021-10-16 NOTE — Telephone Encounter (Signed)
Scheduled appt per 3/22 referral. Pt is aware of appt date and time. Pt is aware to arrive 15 mins prior to appt time and to bring and updated insurance card. Pt is aware of appt location.   ?

## 2021-10-24 DIAGNOSIS — M0609 Rheumatoid arthritis without rheumatoid factor, multiple sites: Secondary | ICD-10-CM | POA: Diagnosis not present

## 2021-10-24 DIAGNOSIS — Z79899 Other long term (current) drug therapy: Secondary | ICD-10-CM | POA: Diagnosis not present

## 2021-10-24 DIAGNOSIS — E1065 Type 1 diabetes mellitus with hyperglycemia: Secondary | ICD-10-CM | POA: Diagnosis not present

## 2021-10-24 DIAGNOSIS — M2550 Pain in unspecified joint: Secondary | ICD-10-CM | POA: Diagnosis not present

## 2021-10-25 NOTE — Progress Notes (Signed)
[name] ?CONSULT NOTE ? ?Patient Care Team: ?Lesleigh Noe, MD as PCP - General (Family Medicine) ?Wallene Huh, MD as Referring Physician (Endocrinology) ?Hermelinda Medicus, MD as Consulting Physician (Internal Medicine) ?Arta Silence, MD as Consulting Physician (Gastroenterology) ?Servando Salina, MD as Consulting Physician (Obstetrics and Gynecology) ? ?CHIEF COMPLAINTS/PURPOSE OF CONSULTATION:  ?At high risk of breast cancer ? ?HISTORY OF PRESENTING ILLNESS:  ?Tiffany [name] 51 y.o. female is here because of recent mammogram that revealed that she was high risk for breast cancer based on Tyer Cusick scoring.  She was referred to [place] for evaluation and recommendations for screening and surveillance.  She has never had any previous breast problems.  Denies any prior breast biopsies.  She had a sister who was diagnosed with breast cancer.  Her mother had ovarian cancer.  They are both deceased and no one had genetic testing done. ? ?I reviewed her records extensively and collaborated the history with the patient. ?  ? ?MEDICAL HISTORY:  ?Past Medical History:  ?Diagnosis Date  ? Arthritis   ? knees, lower back right side  ? Connective tissue disease ([place])   ? Depression   ? Diabetes mellitus   ? Gastroparesis   ? Headache(784.0)   ? Heart murmur   ? as a child - no problem as adult  ? History of kidney stones   ? no surgery  ? Hyperlipidemia   ? Lupus ([place])   ? ? ?SURGICAL HISTORY: ?Past Surgical History:  ?Procedure Laterality Date  ? [place], 2006  ? x 2  ? COLONOSCOPY WITH PROPOFOL N/A 03/16/2013  ? Procedure: COLONOSCOPY WITH PROPOFOL;  Surgeon: Arta Silence, MD;  [facility]: [facility];  Service: Endoscopy;  Laterality: N/A;  ? HYSTEROSCOPY WITH D & C N/A 07/11/2013  ? Procedure: DILATATION AND CURETTAGE /HYSTEROSCOPY;  Surgeon: Marvene Staff, MD;  Location: [facility];  Service: Gynecology;  Laterality: N/A;  ? left surgery shoulder    ? ROBOTIC ASSISTED  LAPAROSCOPIC LYSIS OF ADHESION N/A 07/11/2013  ? Procedure: ROBOTIC ASSISTED LAPAROSCOPIC LYSIS OF ADHESIONS ; REPAIR OF UTERINE DEHISENCE;  Surgeon: Marvene Staff, MD;  Location: [facility];  Service: Gynecology;  Laterality: N/A;  ? TUBAL LIGATION    ? ? ?SOCIAL HISTORY: ?Social History  ? ?Socioeconomic History  ? Marital status: Married  ?  Spouse name: [name]  ? Number of children: 2  ? Years of education: some college  ? Highest education level: Not on file  ?Occupational History  ? Occupation: Unemployed  ?Tobacco Use  ? Smoking status: Never  ? Smokeless tobacco: Never  ?Vaping Use  ? Vaping Use: Never used  ?Substance and Sexual Activity  ? Alcohol use: No  ? Drug use: No  ? Sexual activity: Yes  ?  Birth control/protection: Surgical  ?Other Topics Concern  ? Not on file  ?Social History Narrative  ? 06/05/20  ? From: [place] and moved to [place] in 2003  ? Living: with husband, [name] (2002) and daughter  ? 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   ? Family: 2 daughters [name] and [name] (2006)  ?   ? Enjoys: going out to eat  ?   ? Exercise: joint pain limits activity  ? Diet: tries to followed diabetic diet  ?   ? Safety  ? Seat belts: Yes   ? Guns: Yes  and secure  ? Safe in relationships:  Yes   ? ?Social Determinants of Health  ? ?Financial Resource Strain: Not on file  ?Food Insecurity: Not on file  ?Transportation Needs: Not on file  ?Physical Activity: Not on file  ?Stress: Not on file  ?Social Connections: Not on file  ?Intimate Partner Violence: Not on file  ? ? ?FAMILY HISTORY: ?Family History  ?Problem Relation Age of Onset  ? Diabetes Sister   ? Arthritis Sister   ? Hyperlipidemia Sister   ? Lung cancer Mother   ? Cancer Mother   ?     ?Uterine cancer  ? Alzheimer's disease Maternal Grandmother   ? ? ?ALLERGIES:  is allergic to morphine, duloxetine hcl, gabapentin, morphine and related, and tramadol. ? ?MEDICATIONS:  ?Current Outpatient Medications   ?Medication Sig Dispense Refill  ? albuterol (VENTOLIN HFA) 108 (90 Base) MCG/ACT inhaler Inhale 1-2 puffs into the lungs every 6 (six) hours as needed for wheezing or shortness of breath. 1 each 0  ? aluminum chloride (DRYSOL) 20 % external solution Apply topically every other day. For sweating. 35 mL 0  ? APIDRA SOLOSTAR 100 UNIT/ML Solostar Pen Inject into the skin.    ? atorvastatin (LIPITOR) 40 MG tablet Take 1 tablet (40 mg total) by mouth every morning. 90 tablet 3  ? azelastine (ASTELIN) 0.1 % nasal spray Place into both nostrils.    ? benzonatate (TESSALON PERLES) 100 MG capsule 1-2 capsules up to twice daily as needed for cough 30 capsule 0  ? bisacodyl (DULCOLAX) 5 MG EC tablet Take by mouth as needed.    ? Blood Glucose Monitoring Suppl (GLUCOCOM BLOOD GLUCOSE MONITOR) DEVI Use to test blood sugar 4-6 times daily    ? Blood Glucose Monitoring Suppl (ONE TOUCH ULTRA 2) w/Device KIT SMARTSIG:1 Each Via Meter As Directed    ? bupivacaine (MARCAINE) 0.25 % injection Inject into the skin.    ? Clotrimazole 1 % OINT Apply 1 application topically in the morning and at bedtime. 56.7 g 0  ? Continuous Blood Gluc Receiver ([place]) DEVI 1 each by Misc.(Non-Drug; Combo Route) route daily.    ? Continuous Blood Gluc Sensor (DEXCOM G6 SENSOR) MISC Apply 1 sensor to the skin every 10 days for continuous glucose monitoring.    ? Continuous Blood Gluc Transmit (DEXCOM G6 TRANSMITTER) MISC Use as directed for continuous glucose monitoring. Reuse transmitter for 90 days then discard and replace.    ? cyclobenzaprine (FLEXERIL) 10 MG tablet Take 10 mg by mouth 3 (three) times daily as needed. Muscle spasms  2  ? ferrous sulfate 325 (65 FE) MG tablet Take 1 tablet (325 mg total) by mouth daily with breakfast. 90 tablet 1  ? folic acid (FOLVITE) 1 MG tablet Take 1 mg by mouth daily.    ? GAVILAX 17 GM/SCOOP powder Take by mouth.    ? glucagon 1 MG injection Inject 1 mg into the skin once as needed. For low blood  sugar    ? hydrocortisone (ANUSOL-HC) 25 MG suppository Place 1 suppository (25 mg total) rectally 2 (two) times daily. 12 suppository 0  ? insulin glargine (LANTUS SOLOSTAR) 100 UNIT/ML Solostar Pen If pump failure take 20 units    ? insulin glulisine (APIDRA) 100 UNIT/ML injection Inject via insulin pump up to 60 units daily for T1DM    ? Insulin Human (INSULIN PUMP) SOLN Inject into the skin continuous. Insulin glulisine (Apidra) 100 Units/mL    ? linaclotide (LINZESS) 290 MCG CAPS capsule Take 290 mcg by  mouth daily.     ? loratadine (CLARITIN) 10 MG tablet TAKE 1 TABLET BY MOUTH EVERY DAY 90 tablet 3  ? methotrexate (RHEUMATREX) 2.5 MG tablet Take 15 mg by mouth once a week. Take 6 tablets (12.5 mg total) by mouth once a week    ? metoCLOPramide (REGLAN) 5 MG tablet Take 5 mg by mouth 3 (three) times daily as needed.    ? Multiple Vitamin (MULTI-VITAMINS) TABS Take 1 tablet by mouth daily.    ? nitrofurantoin, macrocrystal-monohydrate, (MACROBID) 100 MG capsule Take 1 capsule (100 mg total) by mouth daily as needed (take around the time of sexual activity to prevent UTIs). 30 capsule 6  ? ondansetron (ZOFRAN ODT) 4 MG disintegrating tablet Take 1 tablet (4 mg total) by mouth every 8 (eight) hours as needed for nausea or vomiting. 6 tablet 0  ? ONETOUCH ULTRA test strip TEST 4 TO 6 TIMES DAILY AS DIRECTED    ? sulfaSALAzine (AZULFIDINE) 500 MG tablet Take 500 mg by mouth at bedtime.    ? SUMAtriptan (IMITREX) 100 MG tablet Take 100 mg by mouth as directed.    ? triamcinolone acetonide (TRIESENCE) 40 MG/ML SUSP Inject into the articular space.    ? Urine Glucose-Ketones Test (KETO-DIASTIX) STRP Use to check urine for ketones with high blood glucose    ? valACYclovir (VALTREX) 1000 MG tablet Take by mouth as needed.    ? zolpidem (AMBIEN CR) 12.5 MG CR tablet Take 1 tablet (12.5 mg total) by mouth at bedtime as needed for sleep. 30 tablet 0  ? ?No current facility-administered medications for this visit.   ? ? ?REVIEW OF SYSTEMS:   ?Constitutional: Denies fevers, chills or abnormal night sweats ?  ?Breast: Tenderness in the left axilla ?All other systems were reviewed with the patient and are negative. ? ?PHYSICAL EXAMINATION:

## 2021-10-28 ENCOUNTER — Inpatient Hospital Stay: Payer: BC Managed Care – PPO | Attending: Hematology and Oncology | Admitting: Hematology and Oncology

## 2021-10-28 ENCOUNTER — Other Ambulatory Visit: Payer: Self-pay

## 2021-10-28 ENCOUNTER — Inpatient Hospital Stay: Payer: BC Managed Care – PPO

## 2021-10-28 DIAGNOSIS — Z8041 Family history of malignant neoplasm of ovary: Secondary | ICD-10-CM

## 2021-10-28 DIAGNOSIS — Z79899 Other long term (current) drug therapy: Secondary | ICD-10-CM | POA: Insufficient documentation

## 2021-10-28 DIAGNOSIS — Z803 Family history of malignant neoplasm of breast: Secondary | ICD-10-CM

## 2021-10-28 DIAGNOSIS — Z9189 Other specified personal risk factors, not elsewhere classified: Secondary | ICD-10-CM | POA: Diagnosis not present

## 2021-10-28 NOTE — Assessment & Plan Note (Addendum)
Mammogram 09/20/2020: Benign, breast density category C ?With family history of sister diagnosed with breast cancer at age 52, Tyrer-Cuzick estimating a lifetime risk of development of breast cancer 22.1% patient was referred to [place]. ? ?Risk assessment: ?Tyrer-Cuzick 10-year risk ?Tyrer-Cuzick lifetime risk ?Baker Janus risk: 5-year risk: ? ?Risk reduction: We discussed pharmacological and nonpharmacological risk reduction measures. ?We discussed the pros and cons of tamoxifen in reducing her risk. ?We also discussed exercise, diet, healthy eating habits, reducing alcohol consumption, and role of supplements in reduction of breast cancer risk. ? ?Based on Gail risk score, she qualifies for adjuvant tamoxifen therapy. ? ?Tamoxifen counseling: We discussed the risks and benefits of tamoxifen. These include but not limited to insomnia, hot flashes, mood changes, vaginal dryness, and weight gain. Although rare, serious side effects including endometrial cancer, risk of blood clots were also discussed. We strongly believe that the benefits far outweigh the risks. Patient understands these risks and consented to starting treatment. Planned treatment duration is 5 years. ? ? ?Breast cancer surveillance: ?1.  Annual mammograms ?2.  Annual breast MRIs ?Genetics consultation ? ?

## 2021-10-30 DIAGNOSIS — E1043 Type 1 diabetes mellitus with diabetic autonomic (poly)neuropathy: Secondary | ICD-10-CM | POA: Diagnosis not present

## 2021-11-11 ENCOUNTER — Encounter: Payer: Self-pay | Admitting: Family Medicine

## 2021-11-11 ENCOUNTER — Ambulatory Visit (INDEPENDENT_AMBULATORY_CARE_PROVIDER_SITE_OTHER): Payer: BC Managed Care – PPO | Admitting: Family Medicine

## 2021-11-11 VITALS — BP 124/78 | HR 97 | Temp 97.9°F | Wt 155.5 lb

## 2021-11-11 DIAGNOSIS — R61 Generalized hyperhidrosis: Secondary | ICD-10-CM

## 2021-11-11 DIAGNOSIS — J302 Other seasonal allergic rhinitis: Secondary | ICD-10-CM | POA: Diagnosis not present

## 2021-11-11 MED ORDER — FEXOFENADINE HCL 180 MG PO TABS: 180.0000 mg | ORAL_TABLET | Freq: Every day | ORAL | 0 refills | Status: DC

## 2021-11-11 MED ORDER — FLUTICASONE PROPIONATE 50 MCG/ACT NA SUSP: 2.0000 | Freq: Every day | NASAL | 6 refills | Status: DC

## 2021-11-11 MED ORDER — ALUMINUM CHLORIDE 20 % EX SOLN: CUTANEOUS | 1 refills | Status: AC

## 2021-11-11 NOTE — Patient Instructions (Addendum)
Allergies ?- start Flonase once daily - start at night, increase to twice daily if helping ? ? ?Switch allergy pill  ?- stop loratadine ?- Start allegra ? ?Mychart or call if symptoms not improved ? ?#Referral ?I have placed a referral to a specialist for you. You should receive a phone call from the specialty office. Make sure your voicemail is not full and that if you are able to answer your phone to unknown or new numbers.  ? ?It may take up to 2 weeks to hear about the referral. If you do not hear anything in 2 weeks, please call our office and ask to speak with the referral coordinator.  ? ? ? ?

## 2021-11-11 NOTE — Assessment & Plan Note (Signed)
Stable, continue Drysol 20%. ?

## 2021-11-11 NOTE — Assessment & Plan Note (Signed)
Chronic worsening symptoms.  Discussed switching from loratadine to Allegra daily.  Also recommended starting Flonase at night and increasing to twice daily if effective.  Patient requesting information about allergy testing and injections referral placed to allergy if symptoms not responding to current treatment. ?

## 2021-11-11 NOTE — Progress Notes (Signed)
? ?Subjective:  ? ?  ?Tiffany [name] is a 51 y.o. female presenting for Seasonal allergies, Nasal Congestion, and Cough (Just at night ) ?  ? ? ?HPI ? ?#Allergies ?- coughing at night ?- coughing on nasal drip ?- will blow her nose at night ?- runny nose ?- some eye symptoms occasionally ?- worse when going outside ?- treatment: taking loratadie 10 mg ?- also took benadryl w/ some improvedment ?- no nasal sprays ?- no sick contact, no fever, chills, body aches ?- wondering about allergy shots - takes so many pills ?- was taking 1 loratidine daily w/ general improvement in symptoms ?- draining fine - no congestion  ? ? ?Review of Systems ? ? ?Social History  ? ?Tobacco Use  ?Smoking Status Never  ?Smokeless Tobacco Never  ? ? ? ?   ?Objective:  ?  ?BP Readings from Last 3 Encounters:  ?11/11/21 124/78  ?10/28/21 138/64  ?09/23/21 120/68  ? ?Wt Readings from Last 3 Encounters:  ?11/11/21 155 lb 8 oz (70.5 kg)  ?10/28/21 155 lb 3.2 oz (70.4 kg)  ?09/23/21 148 lb 4 oz (67.2 kg)  ? ? ?BP 124/78   Pulse 97   Temp 97.9 ?F (36.6 ?C) (Oral)   Wt 155 lb 8 oz (70.5 kg)   SpO2 97%   BMI 25.10 kg/m?  ? ? ?Physical Exam ?Constitutional:   ?   General: She is not in acute distress. ?   Appearance: She is well-developed. She is not diaphoretic.  ?HENT:  ?   Head: Normocephalic and atraumatic.  ?   Right Ear: Tympanic membrane and external ear normal.  ?   Left Ear: Tympanic membrane and external ear normal.  ?   Nose: Mucosal edema present.  ?   Right Turbinates: Swollen.  ?   Left Turbinates: Swollen.  ?Eyes:  ?   Conjunctiva/sclera: Conjunctivae normal.  ?Cardiovascular:  ?   Rate and Rhythm: Normal rate and regular rhythm.  ?   Heart sounds: No murmur heard. ?Pulmonary:  ?   Effort: Pulmonary effort is normal. No respiratory distress.  ?   Breath sounds: Normal breath sounds. No wheezing.  ?Musculoskeletal:  ?   Cervical back: Neck supple.  ?Skin: ?   General: Skin is warm and dry.  ?   Capillary Refill: Capillary refill  takes less than 2 seconds.  ?Neurological:  ?   Mental Status: She is alert. Mental status is at baseline.  ?Psychiatric:     ?   Mood and Affect: Mood normal.     ?   Behavior: Behavior normal.  ? ? ? ? ? ?   ?Assessment & Plan:  ? ?Problem List Items Addressed This Visit   ? ?  ? Other  ? Perspiration excessive  ?  Stable, continue Drysol 20%. ? ?  ?  ? Relevant Medications  ? aluminum chloride (DRYSOL) 20 % external solution  ? Seasonal allergies - Primary  ?  Chronic worsening symptoms.  Discussed switching from loratadine to Allegra daily.  Also recommended starting Flonase at night and increasing to twice daily if effective.  Patient requesting information about allergy testing and injections referral placed to allergy if symptoms not responding to current treatment. ? ?  ?  ? Relevant Medications  ? fexofenadine (ALLEGRA ALLERGY) 180 MG tablet  ? fluticasone (FLONASE) 50 MCG/ACT nasal spray  ? Other Relevant Orders  ? Ambulatory referral to Allergy  ? ? ? ?Return if symptoms worsen or fail  to improve. ? ?Lesleigh Noe, MD ? ? ? ?

## 2021-11-15 DIAGNOSIS — E109 Type 1 diabetes mellitus without complications: Secondary | ICD-10-CM | POA: Diagnosis not present

## 2021-11-15 DIAGNOSIS — Z794 Long term (current) use of insulin: Secondary | ICD-10-CM | POA: Diagnosis not present

## 2021-11-15 DIAGNOSIS — E1043 Type 1 diabetes mellitus with diabetic autonomic (poly)neuropathy: Secondary | ICD-10-CM | POA: Diagnosis not present

## 2021-11-15 DIAGNOSIS — E1065 Type 1 diabetes mellitus with hyperglycemia: Secondary | ICD-10-CM | POA: Diagnosis not present

## 2021-11-22 ENCOUNTER — Ambulatory Visit (HOSPITAL_COMMUNITY)
Admission: EM | Admit: 2021-11-22 | Discharge: 2021-11-22 | Discharge status: Against Medical Advice | Payer: BC Managed Care – PPO | Attending: Internal Medicine | Admitting: Internal Medicine

## 2021-11-22 DIAGNOSIS — Z3202 Encounter for pregnancy test, result negative: Secondary | ICD-10-CM | POA: Diagnosis not present

## 2021-11-22 DIAGNOSIS — N760 Acute vaginitis: Secondary | ICD-10-CM | POA: Diagnosis not present

## 2021-11-22 NOTE — ED Triage Notes (Signed)
Pt called once in lobby with no answer and called on mobile phone and went straight to voicemail. ?

## 2021-11-26 DIAGNOSIS — K649 Unspecified hemorrhoids: Secondary | ICD-10-CM | POA: Diagnosis not present

## 2021-11-26 DIAGNOSIS — D509 Iron deficiency anemia, unspecified: Secondary | ICD-10-CM | POA: Diagnosis not present

## 2021-11-26 DIAGNOSIS — K2930 Chronic superficial gastritis without bleeding: Secondary | ICD-10-CM | POA: Diagnosis not present

## 2021-11-29 DIAGNOSIS — K2930 Chronic superficial gastritis without bleeding: Secondary | ICD-10-CM | POA: Diagnosis not present

## 2021-11-29 DIAGNOSIS — E1043 Type 1 diabetes mellitus with diabetic autonomic (poly)neuropathy: Secondary | ICD-10-CM | POA: Diagnosis not present

## 2021-12-12 ENCOUNTER — Encounter: Payer: Self-pay | Admitting: Family Medicine

## 2021-12-20 ENCOUNTER — Ambulatory Visit
Admission: EM | Admit: 2021-12-20 | Discharge: 2021-12-20 | Disposition: A | Discharge status: Home / Self Care | Payer: BC Managed Care – PPO | Attending: Urgent Care | Admitting: Urgent Care

## 2021-12-20 DIAGNOSIS — M79652 Pain in left thigh: Secondary | ICD-10-CM | POA: Diagnosis not present

## 2021-12-20 DIAGNOSIS — E109 Type 1 diabetes mellitus without complications: Secondary | ICD-10-CM

## 2021-12-20 DIAGNOSIS — M069 Rheumatoid arthritis, unspecified: Secondary | ICD-10-CM | POA: Diagnosis not present

## 2021-12-20 DIAGNOSIS — R52 Pain, unspecified: Secondary | ICD-10-CM | POA: Diagnosis not present

## 2021-12-20 DIAGNOSIS — R58 Hemorrhage, not elsewhere classified: Secondary | ICD-10-CM

## 2021-12-20 DIAGNOSIS — M797 Fibromyalgia: Secondary | ICD-10-CM

## 2021-12-20 MED ORDER — TIZANIDINE HCL 4 MG PO TABS: 4.0000 mg | ORAL_TABLET | Freq: Every day | ORAL | 0 refills | Status: DC

## 2021-12-20 MED ORDER — PREDNISONE 50 MG PO TABS: 50.0000 mg | ORAL_TABLET | Freq: Every day | ORAL | 0 refills | Status: DC
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
## 2022-01-08 DIAGNOSIS — E1065 Type 1 diabetes mellitus with hyperglycemia: Secondary | ICD-10-CM | POA: Diagnosis not present

## 2022-01-14 ENCOUNTER — Ambulatory Visit: Payer: Medicare Other | Admitting: Allergy and Immunology

## 2022-02-04 ENCOUNTER — Encounter: Payer: Self-pay | Admitting: Family Medicine

## 2022-02-04 ENCOUNTER — Ambulatory Visit (INDEPENDENT_AMBULATORY_CARE_PROVIDER_SITE_OTHER): Payer: BC Managed Care – PPO | Admitting: Family Medicine

## 2022-02-04 VITALS — BP 136/82 | HR 100 | Temp 97.9°F | Ht 66.0 in | Wt 145.2 lb

## 2022-02-04 DIAGNOSIS — M069 Rheumatoid arthritis, unspecified: Secondary | ICD-10-CM | POA: Diagnosis not present

## 2022-02-04 DIAGNOSIS — G4762 Sleep related leg cramps: Secondary | ICD-10-CM | POA: Insufficient documentation

## 2022-02-04 DIAGNOSIS — R413 Other amnesia: Secondary | ICD-10-CM | POA: Diagnosis not present

## 2022-02-04 DIAGNOSIS — R42 Dizziness and giddiness: Secondary | ICD-10-CM

## 2022-02-04 NOTE — Assessment & Plan Note (Addendum)
New over last few weeks, she is very young for cognitive issue.  Non-focal neurological exam.  Will check labs for reversible causes of memory loss.  This in setting of significant stressors over the past 30 days.  ?FM brain fog contribution.

## 2022-02-04 NOTE — Assessment & Plan Note (Signed)
Orthostatics normal today.  Encouraged good hydration status.

## 2022-02-04 NOTE — Patient Instructions (Addendum)
Labs today. We will be in touch with results.  Ensure good hydration status - get plenty of water each day.  Good to meet you today.

## 2022-02-04 NOTE — Progress Notes (Signed)
Patient ID: Tiffany [name], female    DOB: [birth year], 51 y.o.   MRN: [id number]  This visit was conducted in person.  BP 136/82   Pulse 100   Temp 97.9 F (36.6 C) (Temporal)   Ht 5' 6"  (1.676 m)   Wt 145 lb 4 oz (65.9 kg)   LMP  (Within Months) Comment: LMP: 11/2021  SpO2 95%   BMI 23.44 kg/m    Orthostatic VS for the past 24 hrs (Last 3 readings):  BP- Lying BP- Standing at 3 minutes  02/04/22 1304 -- 120/70  02/04/22 1258 128/78 --     CC: memory difficulty and leg cramping Subjective:   HPI: Tiffany [name] is a 51 y.o. female presenting on 02/04/2022 for Memory Loss (C/o memory issues- worsening.  Started about 1 mo ago. ) and Leg Pain (C/o nightly leg cramps.  Started about 3 wks ago. )   Tiffany [name]  has a past medical history of Arthritis, Connective tissue disease ([place]), Depression, Diabetes mellitus, Diabetes mellitus type 1, uncontrolled, insulin dependent (04/03/2017), Fibromyalgia (12/02/2017), G6PD deficiency (10/26/2017), Gastroparesis, Headache(784.0), Heart murmur, History of kidney stones, Hyperlipidemia, Lupus ([place]), Migraines (06/05/2020), Rheumatoid arthritis ([place]) (06/05/2020), and Sjogren's syndrome without extraglandular involvement ([place]) (02/24/2018).  K3TW complicated by gastroparesis followed by [facility]. On Lantus 20 daily as well as Apidra insulin through insulin pump.  Lab Results  Component Value Date   HGBA1C 8.6 (H) 08/22/2020     Notes over last few weeks she's forgetting things - where she put things, how to get to rheumatologist office (she regularly sees him). Remembers names ok. Increased stress recently as well.   H/o migraines - none in the past 6 months. Intermittent blurred vision at night - attributed to high sugars from diabetes. Intermittent dizziness worse when in the heat or when bending down. Occ numbness to hands when hands swell.   Maternal grandmother with h/o dementia in her 55s.   She takes b12 vitamin once a week, as well as  MVI and folate regularly, on MTX and with h/o G6PD def.   Notes worsening nocturnal muscle cramps that can wake her up from sleep - treats with mustard and banana. Notes this worse when she takes bowel regimen of dulcolax QOD, miralax daily, linzess QOD.   10 lb weight loss - attributes to decreased appetite from stress.      Relevant past medical, surgical, family and social history reviewed and updated as indicated. Interim medical history since our last visit reviewed. Allergies and medications reviewed and updated. Outpatient Medications Prior to Visit  Medication Sig Dispense Refill   albuterol (VENTOLIN HFA) 108 (90 Base) MCG/ACT inhaler Inhale 1-2 puffs into the lungs every 6 (six) hours as needed for wheezing or shortness of breath. 1 each 0   aluminum chloride (DRYSOL) 20 % external solution Apply topically every other day. For sweating. 35 mL 1   APIDRA SOLOSTAR 100 UNIT/ML Solostar Pen Inject into the skin.     atorvastatin (LIPITOR) 40 MG tablet Take 1 tablet (40 mg total) by mouth every morning. 90 tablet 3   azelastine (ASTELIN) 0.1 % nasal spray Place into both nostrils.     benzonatate (TESSALON PERLES) 100 MG capsule 1-2 capsules up to twice daily as needed for cough 30 capsule 0   bisacodyl (DULCOLAX) 5 MG EC tablet Take by mouth as needed.     Blood Glucose Monitoring Suppl (GLUCOCOM BLOOD GLUCOSE MONITOR) DEVI Use to test blood sugar 4-6  times daily     Blood Glucose Monitoring Suppl (ONE TOUCH ULTRA 2) w/Device KIT SMARTSIG:1 Each Via Meter As Directed     bupivacaine (MARCAINE) 0.25 % injection Inject into the skin.     Clotrimazole 1 % OINT Apply 1 application topically in the morning and at bedtime. 56.7 g 0   Continuous Blood Gluc Receiver ([place]) DEVI 1 each by Misc.(Non-Drug; Combo Route) route daily.     Continuous Blood Gluc Sensor (DEXCOM G6 SENSOR) MISC Apply 1 sensor to the skin every 10 days for continuous glucose monitoring.     Continuous Blood  Gluc Transmit (DEXCOM G6 TRANSMITTER) MISC Use as directed for continuous glucose monitoring. Reuse transmitter for 90 days then discard and replace.     cyclobenzaprine (FLEXERIL) 10 MG tablet Take 10 mg by mouth 3 (three) times daily as needed. Muscle spasms  2   ferrous sulfate 325 (65 FE) MG tablet Take 1 tablet (325 mg total) by mouth daily with breakfast. 90 tablet 1   fexofenadine (ALLEGRA ALLERGY) 180 MG tablet Take 1 tablet (180 mg total) by mouth daily. 30 tablet 0   fluticasone (FLONASE) 50 MCG/ACT nasal spray Place 2 sprays into both nostrils daily. 16 g 6   folic acid (FOLVITE) 1 MG tablet Take 1 mg by mouth daily.     GAVILAX 17 GM/SCOOP powder Take by mouth.     glucagon 1 MG injection Inject 1 mg into the skin once as needed. For low blood sugar     hydrocortisone (ANUSOL-HC) 25 MG suppository Place 1 suppository (25 mg total) rectally 2 (two) times daily. 12 suppository 0   insulin glargine (LANTUS SOLOSTAR) 100 UNIT/ML Solostar Pen If pump failure take 20 units     insulin glulisine (APIDRA) 100 UNIT/ML injection Inject via insulin pump up to 60 units daily for T1DM     Insulin Human (INSULIN PUMP) SOLN Inject into the skin continuous. Insulin glulisine (Apidra) 100 Units/mL     linaclotide (LINZESS) 290 MCG CAPS capsule Take 290 mcg by mouth daily.      methotrexate (RHEUMATREX) 2.5 MG tablet Take 15 mg by mouth once a week. Take 6 tablets (12.5 mg total) by mouth once a week     Multiple Vitamin (MULTI-VITAMINS) TABS Take 1 tablet by mouth daily.     nitrofurantoin, macrocrystal-monohydrate, (MACROBID) 100 MG capsule Take 1 capsule (100 mg total) by mouth daily as needed (take around the time of sexual activity to prevent UTIs). 30 capsule 6   ondansetron (ZOFRAN ODT) 4 MG disintegrating tablet Take 1 tablet (4 mg total) by mouth every 8 (eight) hours as needed for nausea or vomiting. 6 tablet 0   ONETOUCH ULTRA test strip TEST 4 TO 6 TIMES DAILY AS DIRECTED     sulfaSALAzine  (AZULFIDINE) 500 MG tablet Take 500 mg by mouth at bedtime.     SUMAtriptan (IMITREX) 100 MG tablet Take 100 mg by mouth as directed.     tiZANidine (ZANAFLEX) 4 MG tablet Take 1 tablet (4 mg total) by mouth at bedtime. 30 tablet 0   triamcinolone acetonide (TRIESENCE) 40 MG/ML SUSP Inject into the articular space.     Urine Glucose-Ketones Test (KETO-DIASTIX) STRP Use to check urine for ketones with high blood glucose     valACYclovir (VALTREX) 1000 MG tablet Take by mouth as needed.     zolpidem (AMBIEN CR) 12.5 MG CR tablet Take 1 tablet (12.5 mg total) by mouth at bedtime as needed for  sleep. 30 tablet 0   metoCLOPramide (REGLAN) 5 MG tablet Take 5 mg by mouth 3 (three) times daily as needed.     predniSONE (DELTASONE) 50 MG tablet Take 1 tablet (50 mg total) by mouth daily with breakfast. 5 tablet 0   No facility-administered medications prior to visit.     Per HPI unless specifically indicated in ROS section below Review of Systems  Objective:  BP 136/82   Pulse 100   Temp 97.9 F (36.6 C) (Temporal)   Ht 5' 6"  (1.676 m)   Wt 145 lb 4 oz (65.9 kg)   LMP  (Within Months) Comment: LMP: 11/2021  SpO2 95%   BMI 23.44 kg/m   Wt Readings from Last 3 Encounters:  02/04/22 145 lb 4 oz (65.9 kg)  11/11/21 155 lb 8 oz (70.5 kg)  10/28/21 155 lb 3.2 oz (70.4 kg)      Physical Exam Vitals and nursing note reviewed.  Constitutional:      Appearance: Normal appearance. She is not ill-appearing.  HENT:     Mouth/Throat:     Mouth: Mucous membranes are moist.     Pharynx: Oropharynx is clear. No oropharyngeal exudate or posterior oropharyngeal erythema.  Eyes:     Extraocular Movements: Extraocular movements intact.     Conjunctiva/sclera: Conjunctivae normal.     Pupils: Pupils are equal, round, and reactive to light.  Cardiovascular:     Rate and Rhythm: Normal rate and regular rhythm.     Pulses: Normal pulses.     Heart sounds: Normal heart sounds. No murmur  heard. Pulmonary:     Effort: Pulmonary effort is normal. No respiratory distress.     Breath sounds: Normal breath sounds. No wheezing, rhonchi or rales.  Musculoskeletal:        General: Tenderness (stiff movements today due to arthralgias) present.     Right lower leg: No edema.     Left lower leg: No edema.  Skin:    General: Skin is warm and dry.     Findings: No rash.  Neurological:     General: No focal deficit present.     Mental Status: She is alert and oriented to person, place, and time.     Cranial Nerves: No cranial nerve deficit.     Sensory: Sensation is intact.     Motor: Motor function is intact.     Coordination: Coordination is intact. Romberg sign negative.     Comments:  CN 2-12 intact FTN intact EOMI No pronator drift  Psychiatric:        Mood and Affect: Mood normal.        Behavior: Behavior normal.       Results for orders placed or performed in visit on 12/12/21  HM PAP SMEAR  Result Value Ref Range   HM Pap smear negative   Results Console HPV  Result Value Ref Range   CHL HPV Negative     Assessment & Plan:   Problem List Items Addressed This Visit     Rheumatoid arthritis ([place])   Relevant Orders   Folate   Memory difficulty - Primary    New over last few weeks, she is very young for cognitive issue.  Non-focal neurological exam.  Will check labs for reversible causes of memory loss.  This in setting of significant stressors over the past 30 days.  ?FM brain fog contribution.       Relevant Orders   Vitamin B12   TSH  Comprehensive metabolic panel   CBC with Differential/Platelet   RPR   Nocturnal leg cramps    Severe, very bothersome to patient.  Correlates worse when she takes laxative.  Check electrolytes and CPK in statin use.       Relevant Orders   Comprehensive metabolic panel   CK   Magnesium   Dizziness    Orthostatics normal today.  Encouraged good hydration status.         No orders of the defined  types were placed in this encounter.  Orders Placed This Encounter  Procedures   Vitamin B12   TSH   Comprehensive metabolic panel   CBC with Differential/Platelet   RPR   Folate   CK   Magnesium    Patient Instructions  Labs today. We will be in touch with results.  Ensure good hydration status - get plenty of water each day.  Good to meet you today.   Follow up plan: Return if symptoms worsen or fail to improve.  Ria Bush, MD

## 2022-02-04 NOTE — Assessment & Plan Note (Signed)
Severe, very bothersome to patient.  Correlates worse when she takes laxative.  Check electrolytes and CPK in statin use.

## 2022-02-04 NOTE — Addendum Note (Signed)
Addended by: Ellamae Sia on: 02/04/2022 02:24 PM   Modules accepted: Orders

## 2022-02-06 DIAGNOSIS — E1043 Type 1 diabetes mellitus with diabetic autonomic (poly)neuropathy: Secondary | ICD-10-CM | POA: Diagnosis not present

## 2022-02-07 ENCOUNTER — Other Ambulatory Visit (INDEPENDENT_AMBULATORY_CARE_PROVIDER_SITE_OTHER): Payer: BC Managed Care – PPO

## 2022-02-07 DIAGNOSIS — G4762 Sleep related leg cramps: Secondary | ICD-10-CM

## 2022-02-07 DIAGNOSIS — R413 Other amnesia: Secondary | ICD-10-CM | POA: Diagnosis not present

## 2022-02-07 DIAGNOSIS — Z113 Encounter for screening for infections with a predominantly sexual mode of transmission: Secondary | ICD-10-CM | POA: Diagnosis not present

## 2022-02-07 DIAGNOSIS — M069 Rheumatoid arthritis, unspecified: Secondary | ICD-10-CM

## 2022-02-07 LAB — CBC WITH DIFFERENTIAL/PLATELET
Basophils Absolute: 0 10*3/uL (ref 0.0–0.1)
Basophils Relative: 0.8 % (ref 0.0–3.0)
Eosinophils Absolute: 0 10*3/uL (ref 0.0–0.7)
Eosinophils Relative: 0.9 % (ref 0.0–5.0)
HCT: 38.4 % (ref 36.0–46.0)
Hemoglobin: 12.1 g/dL (ref 12.0–15.0)
Lymphocytes Relative: 25.4 % (ref 12.0–46.0)
Lymphs Abs: 1.2 10*3/uL (ref 0.7–4.0)
MCHC: 31.4 g/dL (ref 30.0–36.0)
MCV: 80.2 fl (ref 78.0–100.0)
Monocytes Absolute: 0.4 10*3/uL (ref 0.1–1.0)
Monocytes Relative: 9.2 % (ref 3.0–12.0)
Neutro Abs: 3 10*3/uL (ref 1.4–7.7)
Neutrophils Relative %: 63.7 % (ref 43.0–77.0)
Platelets: 342 10*3/uL (ref 150.0–400.0)
RBC: 4.79 Mil/uL (ref 3.87–5.11)
RDW: 16.5 % — ABNORMAL HIGH (ref 11.5–15.5)
WBC: 4.7 10*3/uL (ref 4.0–10.5)

## 2022-02-07 LAB — COMPREHENSIVE METABOLIC PANEL
ALT: 11 U/L (ref 0–35)
AST: 17 U/L (ref 0–37)
Albumin: 4.5 g/dL (ref 3.5–5.2)
Alkaline Phosphatase: 96 U/L (ref 39–117)
BUN: 18 mg/dL (ref 6–23)
CO2: 30 mEq/L (ref 19–32)
Calcium: 9.4 mg/dL (ref 8.4–10.5)
Chloride: 95 mEq/L — ABNORMAL LOW (ref 96–112)
Creatinine, Ser: 0.79 mg/dL (ref 0.40–1.20)
GFR: 86.49 mL/min (ref >60.00)
Glucose, Bld: 347 mg/dL — ABNORMAL HIGH (ref 70–99)
Potassium: 4.6 mEq/L (ref 3.5–5.1)
Sodium: 133 mEq/L — ABNORMAL LOW (ref 135–145)
Total Bilirubin: 0.5 mg/dL (ref 0.2–1.2)
Total Protein: 8.2 g/dL (ref 6.0–8.3)

## 2022-02-07 LAB — CK: Total CK: 163 U/L (ref 7–177)

## 2022-02-07 LAB — VITAMIN B12: Vitamin B-12: 965 pg/mL — ABNORMAL HIGH (ref 211–911)

## 2022-02-07 LAB — TSH: TSH: 1.82 u[IU]/mL (ref 0.35–5.50)

## 2022-02-07 LAB — FOLATE: Folate: 22.7 ng/mL (ref >5.9)

## 2022-02-07 LAB — MAGNESIUM: Magnesium: 2.3 mg/dL (ref 1.5–2.5)

## 2022-02-08 LAB — RPR: RPR Ser Ql: NONREACTIVE

## 2022-02-17 DIAGNOSIS — Z794 Long term (current) use of insulin: Secondary | ICD-10-CM | POA: Diagnosis not present

## 2022-02-17 DIAGNOSIS — E1043 Type 1 diabetes mellitus with diabetic autonomic (poly)neuropathy: Secondary | ICD-10-CM | POA: Diagnosis not present

## 2022-02-17 DIAGNOSIS — E1065 Type 1 diabetes mellitus with hyperglycemia: Secondary | ICD-10-CM | POA: Diagnosis not present

## 2022-02-17 DIAGNOSIS — E109 Type 1 diabetes mellitus without complications: Secondary | ICD-10-CM | POA: Diagnosis not present

## 2022-02-19 ENCOUNTER — Encounter: Payer: Self-pay | Admitting: Family Medicine

## 2022-02-19 ENCOUNTER — Ambulatory Visit (INDEPENDENT_AMBULATORY_CARE_PROVIDER_SITE_OTHER): Payer: BC Managed Care – PPO | Admitting: Family Medicine

## 2022-02-19 VITALS — BP 122/70 | HR 106 | Temp 97.1°F | Wt 143.5 lb

## 2022-02-19 DIAGNOSIS — Z59819 Housing instability, housed unspecified: Secondary | ICD-10-CM

## 2022-02-19 DIAGNOSIS — F4329 Adjustment disorder with other symptoms: Secondary | ICD-10-CM

## 2022-02-19 DIAGNOSIS — G4762 Sleep related leg cramps: Secondary | ICD-10-CM

## 2022-02-19 DIAGNOSIS — E108 Type 1 diabetes mellitus with unspecified complications: Secondary | ICD-10-CM

## 2022-02-19 NOTE — Assessment & Plan Note (Signed)
Patient in the process of submitting separation paperwork today - she is not sure where she will live.

## 2022-02-19 NOTE — Progress Notes (Signed)
Subjective:     Tiffany [name] is a 51 y.o. female presenting for Spasms (Mostly in legs at night and early in the AM, x 1 month )     HPI  #Spasms - in the morning and at night - will come in the middle of the night - will also come when stretching first thing in the morning - not doing well with hydration - is doing liquid IV which is helping with cramping and overall feeling of dehydration - has tried to focus on drinking more - trying to do 2-3 bottle  Has a flexeril prescription  Has been doing mustard instead  #T1DM - has an insulin pump - not eating regularly -   #Stress - is going through a divorce - has noticed some forgetfulness - will put something down and forget where she put it - last week she had a doctors appointment and forgot where the office was - something does not feel right - has noticed this for 3 weeks - one month ago was when the divorce was initiated - diabetes has not been well controled - has not been eating good - hx of anxiety/depression - "not as bad as it was a long time ago" - hx of taking cymbalta - did not feel this helped - was on several other medications that caused her to feel sleepy  Stool urgency - took her laxatives yesterday - anticipates having a BM today as soon as she leaves the office - cannot use a public restroom  Review of Systems   Social History   Tobacco Use  Smoking Status Never  Smokeless Tobacco Never        Objective:    BP Readings from Last 3 Encounters:  02/19/22 122/70  02/04/22 136/82  12/20/21 126/79   Wt Readings from Last 3 Encounters:  02/19/22 143 lb 8 oz (65.1 kg)  02/04/22 145 lb 4 oz (65.9 kg)  11/11/21 155 lb 8 oz (70.5 kg)    BP 122/70   Pulse (!) 106   Temp (!) 97.1 F (36.2 C) (Temporal)   Wt 143 lb 8 oz (65.1 kg)   SpO2 98%   BMI 23.16 kg/m    Physical Exam Constitutional:      General: She is not in acute distress.    Appearance: She is well-developed. She  is not diaphoretic.  HENT:     Right Ear: External ear normal.     Left Ear: External ear normal.     Nose: Nose normal.  Eyes:     Conjunctiva/sclera: Conjunctivae normal.  Cardiovascular:     Rate and Rhythm: Normal rate and regular rhythm.  Pulmonary:     Effort: Pulmonary effort is normal.     Breath sounds: Normal breath sounds.  Abdominal:     Comments: Pt holding her abdomen and rushing to leave at the end of the office visit.   Musculoskeletal:     Cervical back: Neck supple.  Skin:    General: Skin is warm and dry.     Capillary Refill: Capillary refill takes less than 2 seconds.  Neurological:     Mental Status: She is alert. Mental status is at baseline.  Psychiatric:        Mood and Affect: Mood normal.        Behavior: Behavior normal.           Assessment & Plan:   Problem List Items Addressed This Visit  Endocrine   Type 1 diabetes mellitus with complications ([place])    Poorly controlled, likely due to patient not eating regularly.  She has an insulin pump and is followed by endocrine.  Hemoglobin A1c in care everywhere was in October, advised that she call and follow-up with her endocrinologist.         Other   Nocturnal leg cramps    She was seen 2 weeks ago for this and had blood work done at that time.  No electrolyte issues or elevated CK.  Suspect this is due to dehydration and worse with diabetes being poorly managed.  Discussed increasing hydration to 64 ounces a day, daily stretching routine morning and night, and working with endocrine to control diabetes.  If no improvement discussed that magnesium could be helpful.      Stress and adjustment reaction - Primary    Patient with a history of anxiety and depression, declined medication today.  Worsening stress in the setting of a recent divorce, still currently living with her partner.  She does have social stressors associated with this that she is not currently working.  Referral to social  work, referral to therapy.  Follow-up if she changes her mind about medication      Relevant Orders   Ambulatory referral to Psychology   AMB Referral to [place] instability    Patient in the process of submitting separation paperwork today - she is not sure where she will live.       Relevant Orders   AMB Referral to [facility]   I spent 30 minutes with pt , obtaining history, examining, reviewing chart, documenting encounter and discussing the above plan of care.   Return if symptoms worsen or fail to improve.  Lesleigh Noe, MD

## 2022-02-19 NOTE — Patient Instructions (Addendum)
Leg cramps 1) Diabetes - reach out to endocrine, eat three times daily 2) Hydration - drink at least 64 ounces daily 3) Stretching - twice daily calf and hamstrings  If no improvement - could try iron and magnesium supplements  Social work consult   How to help anxiety - without medication.   1) Regular Exercise - walking, jogging, cycling, dancing, strength training --> Yoga has been shown in research to reduce depression and anxiety -- with even just one hour long session per week  2)  Begin a Mindfulness/Meditation practice -- this can take a little as 3 minutes and is helpful for all kinds of mood issues -- You can find resources in books -- Or you can download apps like  ---- Headspace App (which currently has free content called "Weathering the Storm") ---- Calm (which has a few free options)  ---- Insignt Timer ---- Stop, Breathe & Think  # With each of these Apps - you should decline the "start free trial" offer and as you search through the App should be able to access some of their free content. You can also chose to pay for the content if you find one that works well for you.   # Many of them also offer sleep specific content which may help with insomnia  3) Healthy Diet -- Avoid or decrease Caffeine -- Avoid or decrease Alcohol -- Drink plenty of water, have a balanced diet -- Avoid cigarettes and marijuana (as well as other recreational drugs)  4) Consider contacting a professional therapist  -- [facility] is one option. Call [phone] -- Or you can check out [email or web address] -- you can read bios of therapists and see if they accept insurance -- Check with your insurance to see if you have coverage and who may take your insurance

## 2022-02-19 NOTE — Assessment & Plan Note (Signed)
Poorly controlled, likely due to patient not eating regularly.  She has an insulin pump and is followed by endocrine.  Hemoglobin A1c in care everywhere was in October, advised that she call and follow-up with her endocrinologist.

## 2022-02-19 NOTE — Assessment & Plan Note (Signed)
Patient with a history of anxiety and depression, declined medication today.  Worsening stress in the setting of a recent divorce, still currently living with her partner.  She does have social stressors associated with this that she is not currently working.  Referral to social work, referral to therapy.  Follow-up if she changes her mind about medication

## 2022-02-19 NOTE — Assessment & Plan Note (Signed)
She was seen 2 weeks ago for this and had blood work done at that time.  No electrolyte issues or elevated CK.  Suspect this is due to dehydration and worse with diabetes being poorly managed.  Discussed increasing hydration to 64 ounces a day, daily stretching routine morning and night, and working with endocrine to control diabetes.  If no improvement discussed that magnesium could be helpful.

## 2022-02-20 ENCOUNTER — Telehealth: Payer: Self-pay | Admitting: Family Medicine

## 2022-02-20 NOTE — Telephone Encounter (Signed)
Tried calling patient to schedule Medicare Annual Wellness Visit (AWV) either virtually or phone  No answer  awvi 01/25/13 per [facility]    This should be a 45 minute visit.  I left my direct # [phone]

## 2022-02-21 ENCOUNTER — Telehealth: Payer: Self-pay

## 2022-02-21 NOTE — Telephone Encounter (Signed)
   Telephone encounter was:  Unsuccessful.  02/21/2022 Name: Tiffany [name] MRN: [id number] DOB: May 15, 1971  Unsuccessful outbound call made today to assist with:   will have to talk to patient to determine what resources are needed.  Outreach Attempt:  1st Attempt  Unable to leave message, voicemail is full.  Teller Wakefield, AAS Paralegal, [facility]  [address], [place] [address] ??[email or web address]  ?? [phone]   [email or web address]

## 2022-02-25 ENCOUNTER — Telehealth: Payer: Self-pay

## 2022-02-25 NOTE — Telephone Encounter (Signed)
   Telephone encounter was:  Unsuccessful.  02/25/2022 Name: Tiffany [name] MRN: [id number] DOB: 1971/02/27  Unsuccessful outbound call made today to assist with:   will have to speak with patient to determine what community resources are needed.  Outreach Attempt:  2nd Attempt  Unable to leave message, voicemail is full.  Shreshta Medley, AAS Paralegal, [facility]  [address], [place] [address] ??[email or web address]  ?? [phone]   [email or web address]

## 2022-02-26 ENCOUNTER — Telehealth: Payer: Self-pay

## 2022-02-26 NOTE — Telephone Encounter (Signed)
   Telephone encounter was:  Successful.  02/26/2022 Name: Tiffany [name] MRN: [id number] DOB: 01/06/1971  Tiffany L Chervenak is a 51 y.o. year old female who is a primary care patient of Einar Pheasant, Jobe Marker, MD . The community resource team was consulted for assistance with  I will need to speak to patient to determine what resources are needed.  Care guide performed the following interventions: Patient provided with information about care guide support team and interviewed to confirm resource needs.  Follow Up Plan:  Client will call me tomorrow.  Autymn Omlor, AAS Paralegal, [facility]  [address], [place] [address] ??[email or web address]  ?? [phone]   [email or web address]

## 2022-02-28 ENCOUNTER — Telehealth: Payer: Self-pay | Admitting: *Deleted

## 2022-02-28 ENCOUNTER — Telehealth: Payer: Self-pay

## 2022-02-28 NOTE — Telephone Encounter (Signed)
   Telephone encounter was:  Successful.  02/28/2022 Name: Tiffany [name] MRN: [id number] DOB: 1971/02/20  Tiffany [name] is a 51 y.o. year old female who is a primary care patient of Einar Pheasant, Jobe Marker, MD . The community resource team was consulted for assistance with  housing.  Care guide performed the following interventions: Spoke with patient, she stated that she needed housing resources for [place] area. Patient is also interested in receiving information for the legal referral program at [facility]. I will email the resources requested by end on day Monday.  Also gave the patient contact numbers for [facility] of the [employer] [phone] and [facility] [phone].  Sent message to scheduling Care Guides to schedule patient with a Education officer, museum.   Follow Up Plan:  Care guide will follow up with patient by phone over the next 7 days.  Decklyn Hyder, AAS Paralegal, [facility]  [address], [place] [address] ??[email or web address]  ?? [phone]   [email or web address]

## 2022-02-28 NOTE — Chronic Care Management (AMB) (Signed)
  Chronic Care Management   Note  02/28/2022 Name: Tiffany [name] MRN: [id number] DOB: 29-Jan-1971  Tiffany [name] is a 51 y.o. year old female who is a primary care patient of Lesleigh Noe, MD. I reached out to Tiffany [name] by phone today in response to a referral sent by Ms. Tiffany [name]'s PCP.  [name] was given information about Chronic Care Management services today including:  CCM service includes personalized support from designated clinical staff supervised by her physician, including individualized plan of care and coordination with other care providers 24/7 contact phone numbers for assistance for urgent and routine care needs. Service will only be billed when office clinical staff spend 20 minutes or more in a month to coordinate care. Only one practitioner may furnish and bill the service in a calendar month. The patient may stop CCM services at any time (effective at the end of the month) by phone call to the office staff. The patient is responsible for co-pay (up to 20% after annual deductible is met) if co-pay is required by the individual health plan.   Patient agreed to services and verbal consent obtained.   Follow up plan: Telephone appointment with care management team member scheduled for: 03/03/2022  Julian Hy, [place] Direct Dial: [phone]

## 2022-03-03 ENCOUNTER — Telehealth: Payer: Self-pay

## 2022-03-03 ENCOUNTER — Ambulatory Visit (INDEPENDENT_AMBULATORY_CARE_PROVIDER_SITE_OTHER): Payer: BC Managed Care – PPO | Admitting: *Deleted

## 2022-03-03 DIAGNOSIS — F4329 Adjustment disorder with other symptoms: Secondary | ICD-10-CM

## 2022-03-03 DIAGNOSIS — Z59819 Housing instability, housed unspecified: Secondary | ICD-10-CM

## 2022-03-03 DIAGNOSIS — T7491XD Unspecified adult maltreatment, confirmed, subsequent encounter: Secondary | ICD-10-CM

## 2022-03-03 DIAGNOSIS — M3500 Sicca syndrome, unspecified: Secondary | ICD-10-CM

## 2022-03-03 DIAGNOSIS — M069 Rheumatoid arthritis, unspecified: Secondary | ICD-10-CM
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
## 2022-03-03 NOTE — Telephone Encounter (Signed)
Appreciate SW support
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
## 2022-03-03 NOTE — Telephone Encounter (Signed)
   Telephone encounter was:  Unsuccessful.  03/03/2022 Name: Tiffany [name] MRN: [id number] DOB: [birth year]  Unsuccessful outbound call made today to assist with:   housing, legal  Outreach Attempt:  3rd Attempt.  Referral closed unable to contact patient.  A HIPAA compliant voice message was left requesting a return call.  Instructed patient to call back at [phone]. Unable to leave a message voicemail is full sent sms notification with my phone number.  Emailed resources discussed with patient on 02/28/22.  Letter saved in Epic.    Evelen Vazguez, AAS Paralegal, [facility]  [address], [place] [address] ??[email or web address]  ?? [phone]   [email or web address]

## 2022-03-05 ENCOUNTER — Ambulatory Visit: Payer: Self-pay | Admitting: *Deleted

## 2022-03-05 NOTE — Patient Outreach (Signed)
  Care Coordination   03/05/2022 Name: Tiffany [name] MRN: [id number] DOB: 1970/10/01   Care Coordination Outreach Attempts:  Contact was made with the patient today to offer care coordination services as a benefit of their health plan. The patient requested a return call on a later date.   Follow Up Plan:  Additional outreach attempts will be made to offer the patient care coordination information and services.   Encounter Outcome:  Pt. Request to Call Back  Care Coordination Interventions Activated:  No   Care Coordination Interventions:  No, not indicated    Eduard Clos MSW, LCSW Licensed Clinical Social Worker       [phone]

## 2022-03-07 ENCOUNTER — Ambulatory Visit (INDEPENDENT_AMBULATORY_CARE_PROVIDER_SITE_OTHER): Payer: Medicare Other | Admitting: Internal Medicine

## 2022-03-07 ENCOUNTER — Encounter: Payer: Self-pay | Admitting: Internal Medicine

## 2022-03-07 VITALS — BP 122/72 | HR 92 | Temp 97.7°F | Resp 16 | Ht 66.0 in | Wt 146.4 lb

## 2022-03-07 DIAGNOSIS — J300 Vasomotor rhinitis: Secondary | ICD-10-CM | POA: Diagnosis not present

## 2022-03-07 DIAGNOSIS — J45991 Cough variant asthma: Secondary | ICD-10-CM | POA: Diagnosis not present

## 2022-03-07 DIAGNOSIS — Z9225 Personal history of immunosupression therapy: Secondary | ICD-10-CM

## 2022-03-07 MED ORDER — FLUTICASONE PROPIONATE HFA 44 MCG/ACT IN AERO: 2.0000 | INHALATION_SPRAY | Freq: Two times a day (BID) | RESPIRATORY_TRACT | 6 refills | Status: DC

## 2022-03-07 MED ORDER — PANTOPRAZOLE SODIUM 40 MG PO TBEC
40.0000 mg | DELAYED_RELEASE_TABLET | Freq: Every day | ORAL | 3 refills | Status: DC
Start: 2022-03-07 — End: 2022-07-29

## 2022-03-07 MED ORDER — IPRATROPIUM BROMIDE 0.06 % NA SOLN
2.0000 | Freq: Four times a day (QID) | NASAL | 12 refills | Status: DC
Start: 2022-03-07 — End: 2022-05-03

## 2022-03-07 NOTE — Patient Instructions (Addendum)
Nonallergic rhinitis: Not well controlled  - Testing today showed negative to grass pollen, weed pollen, tree pollen, molds, dust mite, cat, dog, mixed feather, horse, mouse, Candida, tobacco leaf - Stop taking: allegra - Continue with: Flonase (fluticasone) one spray per nostril daily - Start taking:  Ipratropium 1 spray per nostril up to 4 times a day as needed for runny nose - You can use an extra dose of the antihistamine, if needed, for breakthrough symptoms.  - Consider nasal saline rinses 1-2 times daily to remove allergens from the nasal cavities as well as help with mucous clearance (this is especially helpful to do before the nasal sprays are given) -We will refer you to ENT for evaluation of nonallergic causes    Chronic Cough: Etiology of chronic cough is broad. Common considerations include asthma, COPD, allergic rhinitis, nonallergic rhinitis, infections, reflux (GERD/LPR), neurogenic and/or habitual cough.  Mainstay of treatment is to control all possible triggers and address the cough hypersensitivity aspect.   The history and physical examination suggest this cough is multifactorial and potentially attributed to  Rhinitis , uncontrolled asthma, and GERD.  We will address  rhinitis , GERD, and asthma""COPD at this time.  Spirometry showed reversible inflammation in your lungs, this is suggestive of asthma like inflammation in your lungs which could be contributing to cough   PLAN:  Start pantoprazole '40mg'$  daily for treatment of reflux  Start Albuterol 2 puffs every 4-6 hours as needed for cough, wheeze, dyspnea Start Flovent 69mg 2 puffs twice daily with spacer  Spacer sample and instructions provided today   Follow up: in 2 months for cough, we are referring you to ENT for nonallergic rhinitis   Thank you so much for letting me partake in your care today.  Don't hesitate to reach out if you have any additional concerns!  ERoney [place] MD  [facility]

## 2022-03-07 NOTE — Progress Notes (Signed)
New Patient Note  RE: Tiffany [name] MRN: [id number] DOB: Jul 18, 1971 Date of Office Visit: 03/07/2022  Consult requested by: Lesleigh Noe, MD Primary care provider: Lesleigh Noe, MD  Chief Complaint: Establish Care, Nasal Congestion, and Cough  History of Present Illness: I had the pleasure of seeing Tiffany [name] for initial evaluation at the [facility] [place] on 03/07/2022. She is a 51 y.o. female, who is referred here by Lesleigh Noe, MD for the evaluation of rhinitis and cough .  History obtained from patient  and  chart review  .  Chronic rhinitis: started 10 years ago  Symptoms include:  nighttime cough , nasal congestion, rhinorrhea, and post nasal drainage .  Feels like drainage chokes her at night  Occurs year-round Potential triggers: denies  Treatments tried: Allegra ( no benefit), flonase ( will worsen symptoms and causes dry eyes and dry nose worse)  Previous allergy testing: no History of reflux/heartburn: no History of chronic sinusitis or sinus surgery: no Nonallergic triggers:  denies     She also endorses nighttime coughing starting 5 years ago.  Denies any inhalers, OCS for symptoms.  She does have prednisone prescribed for chronic  pain from RA.  Feels like cough is triggered from drainage.    Assessment and Plan: Tiffany is a 51 y.o. female with: Vasomotor rhinitis - Plan: Allergy Test  Cough variant asthma - Plan: Spirometry with Graph, Allergy Test  Personal history of immunosuppression therapy Plan: Patient Instructions  Nonallergic rhinitis: Not well controlled  - Testing today showed negative to grass pollen, weed pollen, tree pollen, molds, dust mite, cat, dog, mixed feather, horse, mouse, Candida, tobacco leaf - Stop taking: allegra - Continue with: Flonase (fluticasone) one spray per nostril daily - Start taking:  Ipratropium 1 spray per nostril up to 4 times a day as needed for runny nose - You can use an extra dose of the  antihistamine, if needed, for breakthrough symptoms.  - Consider nasal saline rinses 1-2 times daily to remove allergens from the nasal cavities as well as help with mucous clearance (this is especially helpful to do before the nasal sprays are given) -We will refer you to ENT for evaluation of nonallergic causes    Chronic Cough: Etiology of chronic cough is broad. Common considerations include asthma, COPD, allergic rhinitis, nonallergic rhinitis, infections, reflux (GERD/LPR), neurogenic and/or habitual cough.  Mainstay of treatment is to control all possible triggers and address the cough hypersensitivity aspect.   The history and physical examination suggest this cough is multifactorial and potentially attributed to  Rhinitis , uncontrolled asthma, and GERD.  We will address  rhinitis , GERD, and asthma""COPD at this time.  Spirometry showed reversible inflammation in your lungs, this is suggestive of asthma like inflammation in your lungs which could be contributing to cough   PLAN:  Start pantoprazole 93m daily for treatment of reflux  Start Albuterol 2 puffs every 4-6 hours as needed for cough, wheeze, dyspnea Start Flovent 465m 2 puffs twice daily with spacer  Spacer sample and instructions provided today   Follow up: in 2 months for cough, we are referring you to ENT for nonallergic rhinitis   Thank you so much for letting me partake in your care today.  Don't hesitate to reach out if you have any additional concerns!  EvRoney MarionMD  [facility] [place] [place]    No follow-ups on file.  Meds ordered this encounter  Medications   fluticasone (FLOVENT HFA) 44 MCG/ACT inhaler    Sig: Inhale 2 puffs into the lungs 2 (two) times daily.    Dispense:  1 each    Refill:  6   pantoprazole (PROTONIX) 40 MG tablet    Sig: Take 1 tablet (40 mg total) by mouth daily.    Dispense:  30 tablet    Refill:  3   ipratropium (ATROVENT) 0.06 % nasal spray    Sig:  Place 2 sprays into both nostrils 4 (four) times daily.    Dispense:  15 mL    Refill:  12   Lab Orders  No laboratory test(s) ordered today    Other allergy screening: Asthma: no Rhino conjunctivitis: yes Food allergy: no Medication allergy:  morphine derivatives cause hives  Hymenoptera allergy: no Urticaria: no Eczema:no History of recurrent infections suggestive of immunodeficency: no  Diagnostics: Spirometry:  Tracings reviewed. Her effort: Good reproducible efforts. FVC: 1.95 L FEV1: 1.82 L, 72% predicted FEV1/FVC ratio: 93% Interpretation: Spirometry consistent with possible restrictive disease.  After 4 puffs of albuterol FVC increased by 12% and 250 cc.  This is a significant postbronchodilator response Please see scanned spirometry results for details.  Skin Testing: Environmental allergy panel. Adequate controls negative to all environmentals Results interpreted by myself and discussed with patient/family.  Airborne Adult Perc - 03/07/22 1000     Time Antigen Placed 1004    Allergen Manufacturer Lavella Hammock    Location Back    Number of Test 59    1. Control-Buffer 50% Glycerol Negative    2. Control-Histamine 1 mg/ml 3+    3. Albumin saline Negative    4. Benson Negative    5. Guatemala Negative    6. Johnson Negative    7. [place] Blue Negative    8. Meadow Fescue Negative    9. Perennial Rye Negative    10. Sweet Vernal Negative    11. Timothy Negative    12. Cocklebur Negative    13. Burweed Marshelder Negative    14. Ragweed, short Negative    15. Ragweed, Giant Negative    16. Plantain,  English Negative    17. Lamb's Quarters Negative    18. Sheep Sorrell Negative    19. Rough Pigweed Negative    20. Marsh Elder, Rough Negative    21. Mugwort, Common Negative    22. Ash mix Negative    23. Birch mix Negative    24. Beech American Negative    25. Box, Elder Negative    26. Cedar, red Negative    27. Cottonwood, [place] Negative    28. Elm mix  Negative    29. Hickory Negative    30. Maple mix Negative    31. Oak, [place] mix Negative    32. Pecan Pollen Negative    33. Pine mix Negative    34. Sycamore Eastern Negative    35. [place], Black Pollen Negative    36. Alternaria alternata Negative    37. Cladosporium Herbarum Negative    38. Aspergillus mix Negative    39. Penicillium mix Negative    40. Bipolaris sorokiniana (Helminthosporium) Negative    41. Drechslera spicifera (Curvularia) Negative    42. Mucor plumbeus Negative    43. Fusarium moniliforme Negative    44. Aureobasidium pullulans (pullulara) Negative    45. Rhizopus oryzae Negative    46. Botrytis cinera Negative    47. Epicoccum nigrum Negative    48. Phoma betae Negative  49. Candida Albicans Negative    50. Trichophyton mentagrophytes Negative    51. Mite, D Farinae  5,000 AU/ml Negative    52. Mite, D Pteronyssinus  5,000 AU/ml Negative    53. Cat Hair 10,000 BAU/ml Negative    54.  Dog Epithelia Negative    55. Mixed Feathers Negative    56. Horse Epithelia Negative    57. Cockroach, German Negative    58. Mouse Negative    59. Tobacco Leaf Negative             Past Medical History: Patient Active Problem List   Diagnosis Date Noted   Stress and adjustment reaction 02/19/2022   Housing instability 02/19/2022   Memory difficulty 02/04/2022   Nocturnal leg cramps 02/04/2022   Dizziness 02/04/2022   Perspiration excessive 11/11/2021   Seasonal allergies 11/11/2021   At high risk for breast cancer 10/28/2021   Hair loss 09/23/2021   Chronic right-sided low back pain without sciatica 09/23/2021   Tinea 09/23/2021   Diabetic retinopathy associated with type 1 diabetes mellitus ([place]) 11/22/2020   External hemorrhoid 09/18/2020   Low iron 09/18/2020   History of shingles 08/22/2020   Post-nasal drip 08/22/2020   Rheumatoid arthritis ([place]) 06/05/2020   Oral herpes simplex infection 06/05/2020   Migraines 06/05/2020   Fibroids  06/05/2020   Recurrent UTI 06/05/2020   Primary osteoarthritis of right hip 09/28/2018   Adhesive capsulitis of left shoulder 03/24/2018   Sjogren's syndrome without extraglandular involvement ([place]) 02/24/2018   SS-A antibody positive 02/24/2018   Fibromyalgia 12/02/2017   G6PD deficiency 10/26/2017   Type 1 diabetes mellitus with complications ([place]) 77/41/2878   Primary generalized (osteo)arthritis 11/17/2016   Primary osteoarthritis, left hand 07/13/2015   Gastroparesis due to DM ([place]) 08/30/2013   Pelvic floor dysfunction 08/18/2013   Constipation 08/03/2013   Past Medical History:  Diagnosis Date   Arthritis    knees, lower back right side   Connective tissue disease ([place])    Depression    Diabetes mellitus    Diabetes mellitus type 1, uncontrolled, insulin dependent 04/03/2017   Fibromyalgia 12/02/2017   G6PD deficiency 10/26/2017   Gastroparesis    Headache(784.0)    Heart murmur    as a child - no problem as adult   History of kidney stones    no surgery   Hyperlipidemia    Lupus ([place])    Migraines 06/05/2020   Rheumatoid arthritis ([place]) 06/05/2020   Sjogren's syndrome without extraglandular involvement ([place]) 02/24/2018   Past Surgical History: Past Surgical History:  Procedure Laterality Date   CESAREAN SECTION  1994, 2006   x 2   COLONOSCOPY WITH PROPOFOL N/A 03/16/2013   Procedure: COLONOSCOPY WITH PROPOFOL;  Surgeon: Arta Silence, MD;  Location: [facility];  Service: Endoscopy;  Laterality: N/A;   HYSTEROSCOPY WITH D & C N/A 07/11/2013   Procedure: DILATATION AND CURETTAGE /HYSTEROSCOPY;  Surgeon: Marvene Staff, MD;  Location: [facility];  Service: Gynecology;  Laterality: N/A;   left surgery shoulder     ROBOTIC ASSISTED LAPAROSCOPIC LYSIS OF ADHESION N/A 07/11/2013   Procedure: ROBOTIC ASSISTED LAPAROSCOPIC LYSIS OF ADHESIONS ; REPAIR OF UTERINE DEHISENCE;  Surgeon: Marvene Staff, MD;  Location: [facility];  Service: Gynecology;  Laterality: N/A;   TUBAL  LIGATION     Medication List:  Current Outpatient Medications  Medication Sig Dispense Refill   albuterol (VENTOLIN HFA) 108 (90 Base) MCG/ACT inhaler Inhale 1-2 puffs into the lungs every 6 (six) hours  as needed for wheezing or shortness of breath. 1 each 0   aluminum chloride (DRYSOL) 20 % external solution Apply topically every other day. For sweating. 35 mL 1   APIDRA SOLOSTAR 100 UNIT/ML Solostar Pen Inject into the skin.     atorvastatin (LIPITOR) 40 MG tablet Take 1 tablet (40 mg total) by mouth every morning. 90 tablet 3   benzonatate (TESSALON PERLES) 100 MG capsule 1-2 capsules up to twice daily as needed for cough 30 capsule 0   bisacodyl (DULCOLAX) 5 MG EC tablet Take by mouth as needed.     Blood Glucose Monitoring Suppl (GLUCOCOM BLOOD GLUCOSE MONITOR) DEVI Use to test blood sugar 4-6 times daily     Blood Glucose Monitoring Suppl (ONE TOUCH ULTRA 2) w/Device KIT SMARTSIG:1 Each Via Meter As Directed     bupivacaine (MARCAINE) 0.25 % injection Inject into the skin.     Clotrimazole 1 % OINT Apply 1 application topically in the morning and at bedtime. 56.7 g 0   Continuous Blood Gluc Receiver ([place]) DEVI 1 each by Misc.(Non-Drug; Combo Route) route daily.     Continuous Blood Gluc Sensor (DEXCOM G6 SENSOR) MISC Apply 1 sensor to the skin every 10 days for continuous glucose monitoring.     Continuous Blood Gluc Transmit (DEXCOM G6 TRANSMITTER) MISC Use as directed for continuous glucose monitoring. Reuse transmitter for 90 days then discard and replace.     cyclobenzaprine (FLEXERIL) 10 MG tablet Take 10 mg by mouth 3 (three) times daily as needed. Muscle spasms  2   ferrous sulfate 325 (65 FE) MG tablet Take 1 tablet (325 mg total) by mouth daily with breakfast. 90 tablet 1   fluticasone (FLONASE) 50 MCG/ACT nasal spray Place 2 sprays into both nostrils daily. 16 g 6   fluticasone (FLOVENT HFA) 44 MCG/ACT inhaler Inhale 2 puffs into the lungs 2 (two) times daily. 1  each 6   folic acid (FOLVITE) 1 MG tablet Take 1 mg by mouth daily.     GAVILAX 17 GM/SCOOP powder Take by mouth.     glucagon 1 MG injection Inject 1 mg into the skin once as needed. For low blood sugar     hydrocortisone (ANUSOL-HC) 25 MG suppository Place 1 suppository (25 mg total) rectally 2 (two) times daily. 12 suppository 0   insulin glargine (LANTUS SOLOSTAR) 100 UNIT/ML Solostar Pen If pump failure take 20 units     insulin glulisine (APIDRA) 100 UNIT/ML injection Inject via insulin pump up to 60 units daily for T1DM     Insulin Human (INSULIN PUMP) SOLN Inject into the skin continuous. Insulin glulisine (Apidra) 100 Units/mL     ipratropium (ATROVENT) 0.06 % nasal spray Place 2 sprays into both nostrils 4 (four) times daily. 15 mL 12   linaclotide (LINZESS) 290 MCG CAPS capsule Take 290 mcg by mouth daily.      methotrexate (RHEUMATREX) 2.5 MG tablet Take 15 mg by mouth once a week. Take 6 tablets (12.5 mg total) by mouth once a week     Multiple Vitamin (MULTI-VITAMINS) TABS Take 1 tablet by mouth daily.     ondansetron (ZOFRAN ODT) 4 MG disintegrating tablet Take 1 tablet (4 mg total) by mouth every 8 (eight) hours as needed for nausea or vomiting. 6 tablet 0   ONETOUCH ULTRA test strip TEST 4 TO 6 TIMES DAILY AS DIRECTED     pantoprazole (PROTONIX) 40 MG tablet Take 1 tablet (40 mg total) by mouth daily.  30 tablet 3   sulfaSALAzine (AZULFIDINE) 500 MG tablet Take 500 mg by mouth at bedtime.     SUMAtriptan (IMITREX) 100 MG tablet Take 100 mg by mouth as directed.     tiZANidine (ZANAFLEX) 4 MG tablet Take 1 tablet (4 mg total) by mouth at bedtime. 30 tablet 0   triamcinolone acetonide (TRIESENCE) 40 MG/ML SUSP Inject into the articular space.     Urine Glucose-Ketones Test (KETO-DIASTIX) STRP Use to check urine for ketones with high blood glucose     valACYclovir (VALTREX) 1000 MG tablet Take by mouth as needed.     zolpidem (AMBIEN CR) 12.5 MG CR tablet Take 1 tablet (12.5 mg  total) by mouth at bedtime as needed for sleep. 30 tablet 0   nitrofurantoin, macrocrystal-monohydrate, (MACROBID) 100 MG capsule Take 1 capsule (100 mg total) by mouth daily as needed (take around the time of sexual activity to prevent UTIs). (Patient not taking: Reported on 03/07/2022) 30 capsule 6   No current facility-administered medications for this visit.   Allergies: Allergies  Allergen Reactions   Morphine Itching and Hives   Duloxetine Hcl     Other reaction(s): constipation   Gabapentin     Leg Pain   Morphine And Related Hives and Itching   Tramadol Hives and Itching   Social History: Social History   Socioeconomic History   Marital status: Married    Spouse name: [name]   Number of children: 2   Years of education: some college   Highest education level: Not on file  Occupational History   Occupation: Unemployed  Tobacco Use   Smoking status: Never   Smokeless tobacco: Never  Vaping Use   Vaping Use: Never used  Substance and Sexual Activity   Alcohol use: No   Drug use: No   Sexual activity: Yes    Birth control/protection: Surgical  Other Topics Concern   Not on file  Social History Narrative   06/05/20   From: [place] and moved to [place] in 2003   Living: with husband, [name] (2002) and daughter   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       Family: 2 daughters [name] and [name] (2006)      Enjoys: going out to eat      Exercise: joint pain limits activity   Diet: tries to followed diabetic diet      Safety   Seat belts: Yes    Guns: Yes  and secure   Safe in relationships: Yes    Social Determinants of Health   Financial Resource Strain: Not on file  Food Insecurity: Not on file  Transportation Needs: Not on file  Physical Activity: Not on file  Stress: Stress Concern Present (03/03/2022)   [place]    Feeling of Stress : Rather much  Social  Connections: Not on file   Lives in a single-family home that is 52 years old.  There are no roaches in the house and bed is 2 feet off the floor.  There are no dust mite precautions on better pillow.  She is not exposed to fumes, chemicals or dust.  There is no HEPA filter in the home and [place].. Smoking: No exposure Occupation: Environmental manager HistoryFreight forwarder in the house: no Carpet in the family room: no Carpet in the bedroom: yes Heating: electric Cooling: central Pet: yes dog  without access to bedroom  Family History: Family History  Problem Relation Age of Onset   Diabetes Sister    Arthritis Sister    Hyperlipidemia Sister    Lung cancer Mother    Cancer Mother        ?Uterine cancer   Alzheimer's disease Maternal Grandmother      ROS: All others negative except as noted per HPI.   Objective: BP 122/72   Pulse 92   Temp 97.7 F (36.5 C)   Resp 16   Ht _0  (1.676 m)   Wt 146 lb 6 oz (66.4 kg)   SpO2 98%   BMI 23.63 kg/m  Body mass index is 23.63 kg/m.  General Appearance:  Alert, cooperative, no distress, appears stated age  Head:  Normocephalic, without obvious abnormality, atraumatic  Eyes:  Conjunctiva clear, EOM's intact  Nose: Nares normal,  erythematous nasal mucosa, hypertrophic turbinates, no visible anterior polyps, and septum midline  Throat: Lips, tongue normal; teeth and gums normal, no tonsillar exudate and + cobblestoning  Neck: Supple, symmetrical  Lungs:   clear to auscultation bilaterally, Respirations unlabored, intermittent dry coughing  Heart:  regular rate and rhythm and no murmur, Appears well perfused  Extremities: No edema  Skin: Skin color, texture, turgor normal, no rashes or lesions on visualized portions of skin  Neurologic: No gross deficits   The plan was reviewed with the patient/family, and all questions/concerned were addressed.  It was my pleasure to see Tiffany  today and participate in her care. Please feel free to contact me with any questions or concerns.  Sincerely,  Roney Marion, MD Allergy & Immunology  [facility] office: [phone] [facility] office: [phone]

## 2022-03-11 ENCOUNTER — Encounter: Payer: Self-pay | Admitting: *Deleted

## 2022-03-11 ENCOUNTER — Telehealth: Payer: Self-pay | Admitting: *Deleted

## 2022-03-11 NOTE — Patient Outreach (Signed)
  Care Coordination   03/11/2022 Name: Tiffany [name] MRN: [id number] DOB: 1971-04-14   Care Coordination Outreach Attempts:  A second unsuccessful outreach was attempted today to offer the patient with information about available care coordination services as a benefit of their health plan.     Follow Up Plan:  Additional outreach attempts will be made to offer the patient care coordination information and services.   Encounter Outcome:  No Answer  Care Coordination Interventions Activated:  No   Care Coordination Interventions:  No, not indicated    Eduard Clos MSW, LCSW Licensed Clinical Social Worker      [phone]

## 2022-03-14 ENCOUNTER — Other Ambulatory Visit: Payer: BC Managed Care – PPO

## 2022-03-14 DIAGNOSIS — R928 Other abnormal and inconclusive findings on diagnostic imaging of breast: Secondary | ICD-10-CM | POA: Diagnosis not present

## 2022-03-14 DIAGNOSIS — M47812 Spondylosis without myelopathy or radiculopathy, cervical region: Secondary | ICD-10-CM | POA: Diagnosis not present

## 2022-03-14 DIAGNOSIS — R300 Dysuria: Secondary | ICD-10-CM | POA: Diagnosis not present

## 2022-03-14 DIAGNOSIS — E109 Type 1 diabetes mellitus without complications: Secondary | ICD-10-CM | POA: Diagnosis not present

## 2022-03-14 DIAGNOSIS — Z8739 Personal history of other diseases of the musculoskeletal system and connective tissue: Secondary | ICD-10-CM | POA: Diagnosis not present

## 2022-03-14 DIAGNOSIS — E782 Mixed hyperlipidemia: Secondary | ICD-10-CM | POA: Diagnosis not present

## 2022-03-14 DIAGNOSIS — M329 Systemic lupus erythematosus, unspecified: Secondary | ICD-10-CM | POA: Diagnosis not present

## 2022-03-19 ENCOUNTER — Encounter: Payer: Self-pay | Admitting: *Deleted

## 2022-03-19 NOTE — Progress Notes (Signed)
Received call from Tery [phone]) from [facility] requesting recent office notes.  States pt has transferred their are and is needing additional information to schedule pt. Successfully faxed office notes and [facility]ogram report [phone]).

## 2022-03-20 ENCOUNTER — Telehealth: Payer: Self-pay | Admitting: Hematology and Oncology

## 2022-03-20 DIAGNOSIS — M19031 Primary osteoarthritis, right wrist: Secondary | ICD-10-CM | POA: Diagnosis not present

## 2022-03-20 DIAGNOSIS — G5601 Carpal tunnel syndrome, right upper limb: Secondary | ICD-10-CM | POA: Diagnosis not present

## 2022-03-20 NOTE — Telephone Encounter (Signed)
Contacted patient to scheduled appointments. Left message with appointment details and a call back number if patient had any questions or could not accommodate the time we provided.   

## 2022-03-24 ENCOUNTER — Telehealth: Payer: Self-pay | Admitting: *Deleted

## 2022-03-24 NOTE — Chronic Care Management (AMB) (Signed)
  Care Coordination  Outreach Note  03/24/2022 Name: [place] L Schatzman MRN: [id number] DOB: 1971/01/09   Care Coordination Outreach Attempts  An unsuccessful telephone outreach was attempted today to offer the patient information about available care coordination services as a benefit of their health plan.   Rescheduling   Follow Up Plan:  Additional outreach attempts will be made to offer the patient care coordination information and services.   Encounter Outcome:  No Answer  Julian Hy, [place] Direct Dial: [phone]

## 2022-03-26 DIAGNOSIS — Z4681 Encounter for fitting and adjustment of insulin pump: Secondary | ICD-10-CM | POA: Diagnosis not present

## 2022-03-26 DIAGNOSIS — E109 Type 1 diabetes mellitus without complications: Secondary | ICD-10-CM | POA: Diagnosis not present

## 2022-03-27 DIAGNOSIS — Z1239 Encounter for other screening for malignant neoplasm of breast: Secondary | ICD-10-CM | POA: Diagnosis not present

## 2022-03-27 DIAGNOSIS — Z803 Family history of malignant neoplasm of breast: Secondary | ICD-10-CM | POA: Diagnosis not present

## 2022-03-27 DIAGNOSIS — Z9189 Other specified personal risk factors, not elsewhere classified: Secondary | ICD-10-CM | POA: Diagnosis not present

## 2022-03-27 DIAGNOSIS — R922 Inconclusive mammogram: Secondary | ICD-10-CM | POA: Diagnosis not present

## 2022-03-27 DIAGNOSIS — L989 Disorder of the skin and subcutaneous tissue, unspecified: Secondary | ICD-10-CM | POA: Diagnosis not present

## 2022-03-27 DIAGNOSIS — M069 Rheumatoid arthritis, unspecified: Secondary | ICD-10-CM

## 2022-04-04 DIAGNOSIS — L309 Dermatitis, unspecified: Secondary | ICD-10-CM | POA: Diagnosis not present

## 2022-04-04 DIAGNOSIS — M0609 Rheumatoid arthritis without rheumatoid factor, multiple sites: Secondary | ICD-10-CM | POA: Diagnosis not present

## 2022-04-16 NOTE — Chronic Care Management (AMB) (Signed)
  Care Coordination  Outreach Note  04/16/2022 Name: Tiffany [name] MRN: [id number] DOB: 02-20-1971   Care Coordination Outreach Attempts: A second unsuccessful outreach was attempted today to offer the patient with information about available care coordination services as a benefit of their health plan.     Follow Up Plan:  Additional outreach attempts will be made to offer the patient care coordination information and services.   Encounter Outcome:  No Answer  Julian Hy, [place] Direct Dial: [phone]

## 2022-04-18 DIAGNOSIS — R0982 Postnasal drip: Secondary | ICD-10-CM | POA: Diagnosis not present

## 2022-04-18 DIAGNOSIS — K219 Gastro-esophageal reflux disease without esophagitis: Secondary | ICD-10-CM | POA: Diagnosis not present

## 2022-04-21 NOTE — Chronic Care Management (AMB) (Signed)
  Care Coordination  Outreach Note  04/21/2022 Name: Tiffany [name] MRN: [id number] DOB: 02-09-1971   Care Coordination Outreach Attempts: A third unsuccessful outreach was attempted today to offer the patient with information about available care coordination services as a benefit of their health plan.   Rescheduling attempts   Follow Up Plan:  No further outreach attempts will be made at this time. We have been unable to contact the patient to offer or enroll patient in care coordination services  Encounter Outcome:  No Answer  Julian Hy, [place] Direct Dial: [phone]

## 2022-05-02 ENCOUNTER — Other Ambulatory Visit: Payer: Self-pay

## 2022-05-02 ENCOUNTER — Observation Stay (HOSPITAL_COMMUNITY)
Admission: EM | Admit: 2022-05-02 | Discharge: 2022-05-03 | Disposition: A | Discharge status: Home / Self Care | Payer: BC Managed Care – PPO | Attending: Internal Medicine | Admitting: Internal Medicine

## 2022-05-02 ENCOUNTER — Encounter (HOSPITAL_COMMUNITY): Payer: Self-pay

## 2022-05-02 DIAGNOSIS — Z794 Long term (current) use of insulin: Secondary | ICD-10-CM | POA: Insufficient documentation

## 2022-05-02 DIAGNOSIS — E119 Type 2 diabetes mellitus without complications: Secondary | ICD-10-CM | POA: Diagnosis not present

## 2022-05-02 DIAGNOSIS — I499 Cardiac arrhythmia, unspecified: Secondary | ICD-10-CM | POA: Diagnosis not present

## 2022-05-02 DIAGNOSIS — E109 Type 1 diabetes mellitus without complications: Secondary | ICD-10-CM | POA: Diagnosis present

## 2022-05-02 DIAGNOSIS — T50902A Poisoning by unspecified drugs, medicaments and biological substances, intentional self-harm, initial encounter: Secondary | ICD-10-CM | POA: Diagnosis present

## 2022-05-02 DIAGNOSIS — T50904A Poisoning by unspecified drugs, medicaments and biological substances, undetermined, initial encounter: Secondary | ICD-10-CM | POA: Diagnosis not present

## 2022-05-02 DIAGNOSIS — K3184 Gastroparesis: Secondary | ICD-10-CM | POA: Diagnosis not present

## 2022-05-02 DIAGNOSIS — T887XXA Unspecified adverse effect of drug or medicament, initial encounter: Secondary | ICD-10-CM | POA: Diagnosis not present

## 2022-05-02 DIAGNOSIS — F4321 Adjustment disorder with depressed mood: Secondary | ICD-10-CM | POA: Diagnosis present

## 2022-05-02 DIAGNOSIS — D649 Anemia, unspecified: Secondary | ICD-10-CM | POA: Diagnosis not present

## 2022-05-02 DIAGNOSIS — Z743 Need for continuous supervision: Secondary | ICD-10-CM | POA: Diagnosis not present

## 2022-05-02 DIAGNOSIS — E1065 Type 1 diabetes mellitus with hyperglycemia: Secondary | ICD-10-CM | POA: Insufficient documentation

## 2022-05-02 DIAGNOSIS — M069 Rheumatoid arthritis, unspecified: Secondary | ICD-10-CM | POA: Diagnosis present

## 2022-05-02 DIAGNOSIS — T50992A Poisoning by other drugs, medicaments and biological substances, intentional self-harm, initial encounter: Secondary | ICD-10-CM | POA: Diagnosis not present

## 2022-05-02 DIAGNOSIS — T450X2A Poisoning by antiallergic and antiemetic drugs, intentional self-harm, initial encounter: Secondary | ICD-10-CM | POA: Diagnosis not present

## 2022-05-02 DIAGNOSIS — R000 Tachycardia, unspecified: Secondary | ICD-10-CM | POA: Diagnosis not present

## 2022-05-02 DIAGNOSIS — E108 Type 1 diabetes mellitus with unspecified complications: Secondary | ICD-10-CM | POA: Diagnosis not present

## 2022-05-02 DIAGNOSIS — Z79899 Other long term (current) drug therapy: Secondary | ICD-10-CM | POA: Diagnosis not present

## 2022-05-02 DIAGNOSIS — R6889 Other general symptoms and signs: Secondary | ICD-10-CM | POA: Diagnosis not present

## 2022-05-02 DIAGNOSIS — E1143 Type 2 diabetes mellitus with diabetic autonomic (poly)neuropathy: Secondary | ICD-10-CM | POA: Diagnosis present

## 2022-05-02 DIAGNOSIS — T1491XA Suicide attempt, initial encounter: Secondary | ICD-10-CM

## 2022-05-02 LAB — CBC
HCT: 35.4 % — ABNORMAL LOW (ref 36.0–46.0)
Hemoglobin: 10.6 g/dL — ABNORMAL LOW (ref 12.0–15.0)
MCH: 25.9 pg — ABNORMAL LOW (ref 26.0–34.0)
MCHC: 29.9 g/dL — ABNORMAL LOW (ref 30.0–36.0)
MCV: 86.6 fL (ref 80.0–100.0)
Platelets: 381 10*3/uL (ref 150–400)
RBC: 4.09 MIL/uL (ref 3.87–5.11)
RDW: 15.5 % (ref 11.5–15.5)
WBC: 7.7 10*3/uL (ref 4.0–10.5)
nRBC: 0 % (ref 0.0–0.2)

## 2022-05-02 LAB — COMPREHENSIVE METABOLIC PANEL
ALT: 15 U/L (ref 0–44)
AST: 30 U/L (ref 15–41)
Albumin: 3.7 g/dL (ref 3.5–5.0)
Alkaline Phosphatase: 80 U/L (ref 38–126)
Anion gap: 13 (ref 5–15)
BUN: 16 mg/dL (ref 6–20)
CO2: 24 mmol/L (ref 22–32)
Calcium: 9.3 mg/dL (ref 8.9–10.3)
Chloride: 101 mmol/L (ref 98–111)
Creatinine, Ser: 0.83 mg/dL (ref 0.44–1.00)
GFR, Estimated: >60 mL/min (ref >60)
Glucose, Bld: 281 mg/dL — ABNORMAL HIGH (ref 70–99)
Potassium: 3.8 mmol/L (ref 3.5–5.1)
Sodium: 138 mmol/L (ref 135–145)
Total Bilirubin: 0.5 mg/dL (ref 0.3–1.2)
Total Protein: 7 g/dL (ref 6.5–8.1)

## 2022-05-02 LAB — MAGNESIUM: Magnesium: 1.9 mg/dL (ref 1.7–2.4)

## 2022-05-02 LAB — ETHANOL: Alcohol, Ethyl (B): <10 mg/dL (ref <10)

## 2022-05-02 LAB — SALICYLATE LEVEL: Salicylate Lvl: <7 mg/dL — ABNORMAL LOW (ref 7.0–30.0)

## 2022-05-02 MED ORDER — INSULIN ASPART 100 UNIT/ML IJ SOLN
3.0000 [IU] | Freq: Three times a day (TID) | INTRAMUSCULAR | Status: DC
  Administered 2022-05-03 (×2): 3 [IU] via SUBCUTANEOUS

## 2022-05-02 MED ORDER — ENOXAPARIN SODIUM 40 MG/0.4ML IJ SOSY
40.0000 mg | PREFILLED_SYRINGE | INTRAMUSCULAR | Status: DC
  Filled 2022-05-02: qty 0.4

## 2022-05-02 MED ORDER — INSULIN ASPART 100 UNIT/ML IJ SOLN
0.0000 [IU] | Freq: Three times a day (TID) | INTRAMUSCULAR | Status: DC
  Administered 2022-05-03: 5 [IU] via SUBCUTANEOUS
  Administered 2022-05-03: 3 [IU] via SUBCUTANEOUS

## 2022-05-02 MED ORDER — MAGNESIUM SULFATE IN D5W 1-5 GM/100ML-% IV SOLN
1.0000 g | Freq: Once | INTRAVENOUS | Status: AC
  Administered 2022-05-03: 1 g via INTRAVENOUS
  Filled 2022-05-02: qty 100

## 2022-05-02 MED ORDER — LACTATED RINGERS IV BOLUS
1000.0000 mL | Freq: Once | INTRAVENOUS | Status: AC
  Administered 2022-05-02: 1000 mL via INTRAVENOUS

## 2022-05-02 MED ORDER — INSULIN ASPART 100 UNIT/ML IJ SOLN: 0.0000 [IU] | INTRAMUSCULAR | Status: DC

## 2022-05-02 MED ORDER — INSULIN GLARGINE-YFGN 100 UNIT/ML ~~LOC~~ SOLN
10.0000 [IU] | Freq: Every day | SUBCUTANEOUS | Status: DC
  Administered 2022-05-03: 10 [IU] via SUBCUTANEOUS
  Filled 2022-05-02 (×2): qty 0.1

## 2022-05-02 MED ORDER — POTASSIUM CHLORIDE CRYS ER 20 MEQ PO TBCR
20.0000 meq | EXTENDED_RELEASE_TABLET | Freq: Once | ORAL | Status: AC
  Administered 2022-05-03: 20 meq via ORAL
  Filled 2022-05-02: qty 1

## 2022-05-02 MED ORDER — SODIUM CHLORIDE 0.9% FLUSH
3.0000 mL | Freq: Two times a day (BID) | INTRAVENOUS | Status: DC
  Administered 2022-05-03 (×2): 3 mL via INTRAVENOUS

## 2022-05-02 MED ORDER — INSULIN ASPART 100 UNIT/ML IJ SOLN
0.0000 [IU] | Freq: Every day | INTRAMUSCULAR | Status: DC
  Administered 2022-05-03: 2 [IU] via SUBCUTANEOUS

## 2022-05-02 MED ORDER — INSULIN GLARGINE-YFGN 100 UNIT/ML ~~LOC~~ SOLN
12.0000 [IU] | Freq: Every day | SUBCUTANEOUS | Status: DC
  Filled 2022-05-02: qty 0.12

## 2022-05-02 NOTE — ED Notes (Signed)
Patient husband called in requesting to pick up patients cell phone. Due to patients items being in lock up unable to assist with that at this time. Patient came in [facility] by police department. Patients husband states that patient would most likely not like to speak with him at this time.

## 2022-05-02 NOTE — H&P (Signed)
History and Physical    Tiffany [name] BCW:[id number] DOB: 07-22-1971 DOA: 05/02/2022  PCP: Pcp, No   Patient coming from: Home   Chief Complaint: Intentional medication overdose   HPI: Tiffany [name] is a 51 y.o. female with medical history significant for insulin-dependent diabetes mellitus, diabetic gastroparesis, and rheumatoid arthritis, now presenting to the emergency department after an intentional medication overdose.  Patient has reported difficulty coping with various life stressors recently, had an argument with her husband, and then took an unknown quantity of multiple antiemetics, Norco, and possibly other prescription medications.  ED Course: Upon arrival to the ED, patient is found to be afebrile and saturating well on room air with slightly elevated heart rate and stable blood pressure.  EKG demonstrates sinus tachycardia with rate 103 and QTc 464 ms.  Chemistry panel notable for glucose 291 and CBC for normocytic anemia with hemoglobin 10.6.    Poison control was consulted from the ED and recommended 12 to 14-hour observation, maintenance of potassium greater than 4 and magnesium greater than 2, and EKG every 4 hours.  She was given a liter of LR in the ED.  Review of Systems:  All other systems reviewed and apart from HPI, are negative.  Past Medical History:  Diagnosis Date   Arthritis    knees, lower back right side   Connective tissue disease ([place])    Depression    Diabetes mellitus    Diabetes mellitus type 1, uncontrolled, insulin dependent 04/03/2017   Fibromyalgia 12/02/2017   G6PD deficiency 10/26/2017   Gastroparesis    Headache(784.0)    Heart murmur    as a child - no problem as adult   History of kidney stones    no surgery   Hyperlipidemia    Lupus ([place])    Migraines 06/05/2020   Rheumatoid arthritis ([place]) 06/05/2020   Sjogren's syndrome without extraglandular involvement ([place]) 02/24/2018    Past Surgical History:  Procedure Laterality Date    CESAREAN SECTION  1994, 2006   x 2   COLONOSCOPY WITH PROPOFOL N/A 03/16/2013   Procedure: COLONOSCOPY WITH PROPOFOL;  Surgeon: Arta Silence, MD;  Location: [facility];  Service: Endoscopy;  Laterality: N/A;   HYSTEROSCOPY WITH D & C N/A 07/11/2013   Procedure: DILATATION AND CURETTAGE /HYSTEROSCOPY;  Surgeon: Marvene Staff, MD;  Location: [facility];  Service: Gynecology;  Laterality: N/A;   left surgery shoulder     ROBOTIC ASSISTED LAPAROSCOPIC LYSIS OF ADHESION N/A 07/11/2013   Procedure: ROBOTIC ASSISTED LAPAROSCOPIC LYSIS OF ADHESIONS ; REPAIR OF UTERINE DEHISENCE;  Surgeon: Marvene Staff, MD;  Location: [facility];  Service: Gynecology;  Laterality: N/A;   TUBAL LIGATION      Social History:   reports that she has never smoked. She has never used smokeless tobacco. She reports that she does not drink alcohol and does not use drugs.  Allergies  Allergen Reactions   Morphine Itching and Hives   Duloxetine Hcl     Other reaction(s): constipation   Gabapentin     Leg Pain   Morphine And Related Hives and Itching   Tramadol Hives and Itching    Family History  Problem Relation Age of Onset   Diabetes Sister    Arthritis Sister    Hyperlipidemia Sister    Lung cancer Mother    Cancer Mother        ?Uterine cancer   Alzheimer's disease Maternal Grandmother      Prior to Admission medications  Medication Sig Start Date End Date Taking? Authorizing Provider  albuterol (VENTOLIN HFA) 108 (90 Base) MCG/ACT inhaler Inhale 1-2 puffs into the lungs every 6 (six) hours as needed for wheezing or shortness of breath. 05/09/21   Teodora Medici, FNP  aluminum chloride (DRYSOL) 20 % external solution Apply topically every other day. For sweating. 11/11/21   Lesleigh Noe, MD  APIDRA SOLOSTAR 100 UNIT/ML Solostar Pen Inject into the skin. 05/29/20   [provider]  atorvastatin (LIPITOR) 40 MG tablet Take 1 tablet (40 mg total) by mouth every morning. 03/26/21   Lesleigh Noe, MD  benzonatate (TESSALON PERLES) 100 MG capsule 1-2 capsules up to twice daily as needed for cough 08/01/21   Lucretia Kern, DO  bisacodyl (DULCOLAX) 5 MG EC tablet Take by mouth as needed.    [provider]  Blood Glucose Monitoring Suppl (GLUCOCOM BLOOD GLUCOSE MONITOR) DEVI Use to test blood sugar 4-6 times daily 08/02/19   [provider]  Blood Glucose Monitoring Suppl (ONE TOUCH ULTRA 2) w/Device KIT SMARTSIG:1 Each Via Meter As Directed 11/20/20   [provider]  bupivacaine (MARCAINE) 0.25 % injection Inject into the skin. 08/16/20   [provider]  Clotrimazole 1 % OINT Apply 1 application topically in the morning and at bedtime. 09/23/21   Lesleigh Noe, MD  Continuous Blood Gluc Receiver ([place]) [place] 1 each by [place].(Non-Drug; Combo Route) route daily. 05/10/19   [provider]  Continuous Blood Gluc Sensor (DEXCOM G6 SENSOR) MISC Apply 1 sensor to the skin every 10 days for continuous glucose monitoring. 05/10/19   [provider]  Continuous Blood Gluc Transmit (DEXCOM G6 TRANSMITTER) MISC Use as directed for continuous glucose monitoring. Reuse transmitter for 90 days then discard and replace. 05/10/19   [provider]  cyclobenzaprine (FLEXERIL) 10 MG tablet Take 10 mg by mouth 3 (three) times daily as needed. Muscle spasms 12/01/15   [provider]  ferrous sulfate 325 (65 FE) MG tablet Take 1 tablet (325 mg total) by mouth daily with breakfast. 08/22/20   Lesleigh Noe, MD  fluticasone (FLONASE) 50 MCG/ACT nasal spray Place 2 sprays into both nostrils daily. 11/11/21   Lesleigh Noe, MD  fluticasone (FLOVENT HFA) 44 MCG/ACT inhaler Inhale 2 puffs into the lungs 2 (two) times daily. 03/07/22   Roney Marion, MD  folic acid (FOLVITE) 1 MG tablet Take 1 mg by mouth daily. 12/27/19   [provider]  GAVILAX 17 GM/SCOOP powder Take by mouth. 01/06/20   [provider]   glucagon 1 MG injection Inject 1 mg into the skin once as needed. For low blood sugar 12/13/15   [provider]  hydrocortisone (ANUSOL-HC) 25 MG suppository Place 1 suppository (25 mg total) rectally 2 (two) times daily. 09/18/20   Lesleigh Noe, MD  insulin glargine (LANTUS SOLOSTAR) 100 UNIT/ML Solostar Pen If pump failure take 20 units 03/30/20   [provider]  insulin glulisine (APIDRA) 100 UNIT/ML injection Inject via insulin pump up to 60 units daily for T1DM 04/05/20   [provider]  Insulin Human (INSULIN PUMP) SOLN Inject into the skin continuous. Insulin glulisine (Apidra) 100 Units/mL    [provider]  ipratropium (ATROVENT) 0.06 % nasal spray Place 2 sprays into both nostrils 4 (four) times daily. 03/07/22   Roney Marion, MD  linaclotide Rolan Lipa) 290 MCG CAPS capsule Take 290 mcg by mouth daily.     [provider]  methotrexate (RHEUMATREX) 2.5 MG tablet Take 15 mg by mouth once a week. Take 6 tablets (12.5 mg total) by mouth once a week 03/09/20   [provider]  Multiple Vitamin (MULTI-VITAMINS) TABS Take 1 tablet by mouth daily.    [provider]  nitrofurantoin, macrocrystal-monohydrate, (MACROBID) 100 MG capsule Take 1 capsule (100 mg total) by mouth daily as needed (take around the time of sexual activity to prevent UTIs). Patient not taking: Reported on 03/07/2022 12/05/20   Billey Co, MD  ondansetron (ZOFRAN ODT) 4 MG disintegrating tablet Take 1 tablet (4 mg total) by mouth every 8 (eight) hours as needed for nausea or vomiting. 01/09/18   Leaphart, Zack Seal, PA-C  ONETOUCH ULTRA test strip TEST 4 TO 6 TIMES DAILY AS DIRECTED 11/20/20   [provider]  pantoprazole (PROTONIX) 40 MG tablet Take 1 tablet (40 mg total) by mouth daily. 03/07/22   Roney Marion, MD  sulfaSALAzine (AZULFIDINE) 500 MG tablet Take 500 mg by mouth at bedtime. 11/14/20   [provider]  SUMAtriptan (IMITREX)  100 MG tablet Take 100 mg by mouth as directed. 05/14/20   [provider]  tiZANidine (ZANAFLEX) 4 MG tablet Take 1 tablet (4 mg total) by mouth at bedtime. 12/20/21   Jaynee Eagles, PA-C  triamcinolone acetonide (TRIESENCE) 40 MG/ML SUSP Inject into the articular space. 11/25/20   [provider]  Urine Glucose-Ketones Test [facility]) STRP Use to check urine for ketones with high blood glucose 06/16/17   [provider]  valACYclovir (VALTREX) 1000 MG tablet Take by mouth as needed. 04/08/20   [provider]  zolpidem (AMBIEN CR) 12.5 MG CR tablet Take 1 tablet (12.5 mg total) by mouth at bedtime as needed for sleep. 07/11/13   Servando Salina, MD    Physical Exam: Vitals:   05/02/22 2145 05/02/22 2200 05/02/22 2215 05/02/22 2230  BP: 123/70 126/75 125/77 119/73  Pulse: 98 100 99 98  Resp: 19 15 17 17   Temp:      TempSrc:      SpO2: 97% 98% 97% 97%  Weight:      Height:         Constitutional: NAD, no pallor or diaphoresis   Eyes: PERTLA, lids and conjunctivae normal ENMT: Mucous membranes are moist. Posterior pharynx clear of any exudate or lesions.   Neck: supple, no masses  Respiratory: clear to auscultation bilaterally, no wheezing, no crackles. No accessory muscle use.  Cardiovascular: S1 & S2 heard, regular rate and rhythm. No extremity edema.   Abdomen: No distension, no tenderness, soft. Bowel sounds active.  Musculoskeletal: no clubbing / cyanosis. No joint deformity upper and lower extremities.   Skin: no significant rashes, lesions, ulcers. Warm, dry, well-perfused. Neurologic: CN 2-12 grossly intact. Moving all extremities. Alert and oriented.  Psychiatric: Calm. Cooperative.    Labs and Imaging on Admission: I have personally reviewed following labs and imaging studies  CBC: Recent Labs  Lab 05/02/22 2028  WBC 7.7  HGB 10.6*  HCT 35.4*  MCV 86.6  PLT 017   Basic Metabolic Panel: Recent Labs  Lab 05/02/22 2028  NA  138  K 3.8  CL 101  CO2 24  GLUCOSE 281*  BUN 16  CREATININE 0.83  CALCIUM 9.3  MG 1.9   GFR: Estimated Creatinine Clearance: 75.1 mL/min (by C-G formula based on SCr of 0.83 mg/dL). Liver Function Tests: Recent Labs  Lab 05/02/22 2028  AST 30  ALT 15  ALKPHOS 80  BILITOT 0.5  PROT 7.0  ALBUMIN 3.7   No results for input(s): "LIPASE", "AMYLASE" in the last 168 hours. No results for input(s): "AMMONIA" in the last 168 hours. Coagulation Profile: No results for input(s): "INR", "PROTIME" in the last 168 hours. Cardiac Enzymes: No results for input(s): "CKTOTAL", "CKMB", "CKMBINDEX", "TROPONINI" in the last 168 hours. BNP (last 3 results) No results for input(s): "PROBNP" in the last 8760 hours. HbA1C: No results for input(s): "HGBA1C" in the last 72 hours. CBG: No results for input(s): "GLUCAP" in the last 168 hours. Lipid Profile: No results for input(s): "CHOL", "HDL", "LDLCALC", "TRIG", "CHOLHDL", "LDLDIRECT" in the last 72 hours. Thyroid Function Tests: No results for input(s): "TSH", "T4TOTAL", "FREET4", "T3FREE", "THYROIDAB" in the last 72 hours. Anemia Panel: No results for input(s): "VITAMINB12", "FOLATE", "FERRITIN", "TIBC", "IRON", "RETICCTPCT" in the last 72 hours. Urine analysis:    Component Value Date/Time   COLORURINE YELLOW 01/08/2018 1615   APPEARANCEUR Cloudy (A) 12/05/2020 1257   LABSPEC 1.025 01/08/2018 1615   PHURINE 5.0 01/08/2018 1615   GLUCOSEU Negative 12/05/2020 1257   HGBUR SMALL (A) 01/08/2018 1615   BILIRUBINUR Negative 12/05/2020 1257   KETONESUR NEGATIVE 01/08/2018 1615   PROTEINUR 1+ (A) 12/05/2020 1257   PROTEINUR NEGATIVE 01/08/2018 1615   UROBILINOGEN 0.2 11/21/2020 0950   UROBILINOGEN 0.2 08/27/2013 0226   NITRITE Negative 12/05/2020 1257   NITRITE NEGATIVE 01/08/2018 1615   LEUKOCYTESUR Negative 12/05/2020 1257   Sepsis Labs: @LABRCNTIP (procalcitonin:4,lacticidven:4) )No results found for this or any previous visit (from  the past 240 hour(s)).   Radiological Exams on Admission: No results found.  EKG: Independently reviewed. Sinus tachycardia, rate 103, QTc 464 ms.   Assessment/Plan   1. Intentional medication overdose  - Presents after intentional ingestion of multiple antiemetics and possibly Norco and other medications in attempt to harm herself  - She was IVC'd by PD pta  - Continue suicide precautions with 1:1 observation and per Poison Control recommendations, plan to continue cardiac monitoring, check q4h EKG, keep potassium >4 and mag >2 - Consult psychiatry once medically-cleared   2. Insulin-dependent DM  - A1c was 8.4% in September 2023 - Remove insulin pump, check CBGs, and use long-acting insulin plus mealtime Novolog and SSI for now    3. Gastroparesis - Will need to hold antiemetics for now     DVT prophylaxis: Lovenox  Code Status: Full  Level of Care: Level of care: Telemetry Medical Family Communication: none present  Disposition Plan:  Patient is from: home  Anticipated d/c is to: TBD Anticipated d/c date is: 05/03/22  Patient currently: Pending 12-14 hr observation on telemetry, then psychiatric evaluation  Consults called: none  Admission status: Observation     Vianne Bulls, MD [facility]  05/02/2022, 11:02 PM

## 2022-05-02 NOTE — ED Provider Notes (Signed)
[facility] EMERGENCY DEPARTMENT Provider Note   CSN: [id number] Arrival date & time: 05/02/22  2014     History  No chief complaint on file.   Tiffany [name] is a 51 y.o. female.  HPI  Per EMS report, the police, and the patient, the patient took an unknown amount of pills at approximately 8:00.  Patient estimates that she took approximately 50 Reglan but she also took a combination of Zofran, Compazine, folic acid, Norco.  She is unsure how many beats that she took and notes that she did in a suicide attempt.  Patient endorses that she has been depressed recently and she got in fight with her husband today prompting her to call medications.  Dors is that she is having abdominal pain denies any nausea, vomiting, diarrhea.  Patient states that she would not have had access to any other medications outside of the pills that were brought in by EMS.    Home Medications Prior to Admission medications   Medication Sig Start Date End Date Taking? Authorizing Provider  albuterol (VENTOLIN HFA) 108 (90 Base) MCG/ACT inhaler Inhale 1-2 puffs into the lungs every 6 (six) hours as needed for wheezing or shortness of breath. 05/09/21   Teodora Medici, FNP  aluminum chloride (DRYSOL) 20 % external solution Apply topically every other day. For sweating. 11/11/21   Lesleigh Noe, MD  APIDRA SOLOSTAR 100 UNIT/ML Solostar Pen Inject into the skin. 05/29/20   [provider]  atorvastatin (LIPITOR) 40 MG tablet Take 1 tablet (40 mg total) by mouth every morning. 03/26/21   Lesleigh Noe, MD  benzonatate (TESSALON PERLES) 100 MG capsule 1-2 capsules up to twice daily as needed for cough 08/01/21   Lucretia Kern, DO  bisacodyl (DULCOLAX) 5 MG EC tablet Take by mouth as needed.    [provider]  Blood Glucose Monitoring Suppl (GLUCOCOM BLOOD GLUCOSE MONITOR) DEVI Use to test blood sugar 4-6 times daily 08/02/19   [provider]  Blood Glucose Monitoring Suppl  (ONE TOUCH ULTRA 2) w/Device KIT SMARTSIG:1 Each Via Meter As Directed 11/20/20   [provider]  bupivacaine (MARCAINE) 0.25 % injection Inject into the skin. 08/16/20   [provider]  Clotrimazole 1 % OINT Apply 1 application topically in the morning and at bedtime. 09/23/21   Lesleigh Noe, MD  Continuous Blood Gluc Receiver ([place]) [place] 1 each by [place].(Non-Drug; Combo Route) route daily. 05/10/19   [provider]  Continuous Blood Gluc Sensor (DEXCOM G6 SENSOR) MISC Apply 1 sensor to the skin every 10 days for continuous glucose monitoring. 05/10/19   [provider]  Continuous Blood Gluc Transmit (DEXCOM G6 TRANSMITTER) MISC Use as directed for continuous glucose monitoring. Reuse transmitter for 90 days then discard and replace. 05/10/19   [provider]  cyclobenzaprine (FLEXERIL) 10 MG tablet Take 10 mg by mouth 3 (three) times daily as needed. Muscle spasms 12/01/15   [provider]  ferrous sulfate 325 (65 FE) MG tablet Take 1 tablet (325 mg total) by mouth daily with breakfast. 08/22/20   Lesleigh Noe, MD  fluticasone (FLONASE) 50 MCG/ACT nasal spray Place 2 sprays into both nostrils daily. 11/11/21   Lesleigh Noe, MD  fluticasone (FLOVENT HFA) 44 MCG/ACT inhaler Inhale 2 puffs into the lungs 2 (two) times daily. 03/07/22   Roney Marion, MD  folic acid (FOLVITE) 1 MG tablet Take 1 mg by mouth daily. 12/27/19  [provider]  Sanjuan Dame 17 GM/SCOOP powder Take by mouth. 01/06/20   [provider]  glucagon 1 MG injection Inject 1 mg into the skin once as needed. For low blood sugar 12/13/15   [provider]  hydrocortisone (ANUSOL-HC) 25 MG suppository Place 1 suppository (25 mg total) rectally 2 (two) times daily. 09/18/20   Lesleigh Noe, MD  insulin glargine (LANTUS SOLOSTAR) 100 UNIT/ML Solostar Pen If pump failure take 20 units 03/30/20   [provider]  insulin glulisine  (APIDRA) 100 UNIT/ML injection Inject via insulin pump up to 60 units daily for T1DM 04/05/20   [provider]  Insulin Human (INSULIN PUMP) SOLN Inject into the skin continuous. Insulin glulisine (Apidra) 100 Units/mL    [provider]  ipratropium (ATROVENT) 0.06 % nasal spray Place 2 sprays into both nostrils 4 (four) times daily. 03/07/22   Roney Marion, MD  linaclotide Rolan Lipa) 290 MCG CAPS capsule Take 290 mcg by mouth daily.     [provider]  methotrexate (RHEUMATREX) 2.5 MG tablet Take 15 mg by mouth once a week. Take 6 tablets (12.5 mg total) by mouth once a week 03/09/20   [provider]  Multiple Vitamin (MULTI-VITAMINS) TABS Take 1 tablet by mouth daily.    [provider]  nitrofurantoin, macrocrystal-monohydrate, (MACROBID) 100 MG capsule Take 1 capsule (100 mg total) by mouth daily as needed (take around the time of sexual activity to prevent UTIs). Patient not taking: Reported on 03/07/2022 12/05/20   Billey Co, MD  ondansetron (ZOFRAN ODT) 4 MG disintegrating tablet Take 1 tablet (4 mg total) by mouth every 8 (eight) hours as needed for nausea or vomiting. 01/09/18   Leaphart, Zack Seal, PA-C  ONETOUCH ULTRA test strip TEST 4 TO 6 TIMES DAILY AS DIRECTED 11/20/20   [provider]  pantoprazole (PROTONIX) 40 MG tablet Take 1 tablet (40 mg total) by mouth daily. 03/07/22   Roney Marion, MD  sulfaSALAzine (AZULFIDINE) 500 MG tablet Take 500 mg by mouth at bedtime. 11/14/20   [provider]  SUMAtriptan (IMITREX) 100 MG tablet Take 100 mg by mouth as directed. 05/14/20   [provider]  tiZANidine (ZANAFLEX) 4 MG tablet Take 1 tablet (4 mg total) by mouth at bedtime. 12/20/21   Jaynee Eagles, PA-C  triamcinolone acetonide (TRIESENCE) 40 MG/ML SUSP Inject into the articular space. 11/25/20   [provider]  Urine Glucose-Ketones Test [facility]) STRP Use to check urine for ketones with high blood  glucose 06/16/17   [provider]  valACYclovir (VALTREX) 1000 MG tablet Take by mouth as needed. 04/08/20   [provider]  zolpidem (AMBIEN CR) 12.5 MG CR tablet Take 1 tablet (12.5 mg total) by mouth at bedtime as needed for sleep. 07/11/13   Servando Salina, MD      Allergies    Morphine, Duloxetine hcl, Gabapentin, Morphine and related, and Tramadol    Review of Systems   Review of Systems  Physical Exam Updated Vital Signs There were no vitals taken for this visit. Physical Exam Vitals and nursing note reviewed.  Constitutional:      General: She is not in acute distress.    Appearance: She is well-developed. She is not ill-appearing.  HENT:     Head: Normocephalic and atraumatic.  Eyes:     Conjunctiva/sclera: Conjunctivae normal.  Cardiovascular:     Rate and Rhythm: Regular rhythm. Tachycardia present.     Heart sounds: No murmur heard.  Pulmonary:     Effort: Pulmonary effort is normal. No respiratory distress.     Breath sounds: Normal breath sounds.  Abdominal:     Palpations: Abdomen is soft.     Tenderness: There is no abdominal tenderness.  Musculoskeletal:        General: No swelling.     Cervical back: Neck supple.  Skin:    General: Skin is warm and dry.     Capillary Refill: Capillary refill takes less than 2 seconds.  Neurological:     General: No focal deficit present.     Mental Status: She is alert and oriented to person, place, and time.  Psychiatric:     Comments: Appears anxious, suicidal ideation with a plan     ED Results / Procedures / Treatments   Labs (all labs ordered are listed, but only abnormal results are displayed) Labs Reviewed  COMPREHENSIVE METABOLIC PANEL  CBC  URINALYSIS, ROUTINE W REFLEX MICROSCOPIC  PREGNANCY, URINE  RAPID URINE DRUG SCREEN, HOSP PERFORMED  ETHANOL  SALICYLATE LEVEL  ACETAMINOPHEN LEVEL    EKG EKG Interpretation  Date/Time:  Friday May 02 2022 20:23:17 EDT Ventricular  Rate:  103 PR Interval:  135 QRS Duration: 82 QT Interval:  354 QTC Calculation: 464 R Axis:   44 Text Interpretation: Sinus tachycardia Confirmed by Lajean Saver [phone]) on 05/02/2022 8:24:50 PM  Radiology No results found.  Procedures Procedures    Medications Ordered in ED Medications - No data to display  ED Course/ Medical Decision Making/ A&P                          Medical Decision Making Amount and/or Complexity of Data Reviewed Independent Historian: EMS Labs: ordered. Decision-making details documented in ED Course. Radiology:  Decision-making details documented in ED Course. ECG/medicine tests:  Decision-making details documented in ED Course.  Risk Decision regarding hospitalization.   Medical Decision Making  This patient is Presenting for Evaluation of overdose, patient has a past medical history diabetes, hypertension, reflux, G6PD deficiency which complicates their presentation.  Of which does require a range of treatment options, and is a complaint that involves a high risk of morbidity and mortality.  Arrived in ED by:  EMS History obtained from: The patient  Limitations in history: none    At this time I am most concerned for Reglan overdose. Also considering additional overdose, additional drug use, metabolic abnormality, tachydysrhythmia. Plan for laboratory and imaging study work-up    EKG: I interpreted the ECG. It reveals a sinus rhythm. The , PR, and QRS are appropriate. There are no signs of acute ischemia or of significant electrical abnormalities. The ECG does not show a STEMI. There are no ST depressions. There are no T wave inversions. There is no evidence of a High-Grade Conduction Block.  QTc mildly prolonged at 464   Laboratory work-up significant for:  -CBC revealed mildly low hemoglobin at 10.6, no additional significant hematologic abnormalities - CMP revealed elevated glucose of 281, no additional significant metabolic  abnormalities, no anion gap - Magnesium 1.9 - Salicylate level less than 7 - Ethanol level less than 10 - 4-hour acetaminophen ordered    Interventions and Interval History: Postop present on scene: -Reglan  5 mg apx 50 -Zofran 4 mg apx 6 -compazine 25 mg apx 10 -folic acid 1 mg apx 90 -norco 7.5-325  -Patient made hemodynamically stable during her time in the emergency department, she was not significantly lethargic  and did not require any airway intervention.  Patient is initial QTc mildly prolonged, we will keep the patient on telemetry for continued cardiac monitoring and will repeat EKG in 4 hours -Spoke with poison control, noted that Reglan required to 12 hours observation period.  They noted that she was below a toxic induction dose for Zofran and Compazine however I do have a clinical concern that the combination of both Reglan, Zofran, Compazine could lead to prolonged QTc predisposing the patient to to tachydysrhythmia. -Patient was IVC by law enforcement due to her suicide attempt.  IVC paperwork completed   Additional documents reviewed: Outside hospital record    Disposition: Due to the patients current presenting symptoms, physical exam findings, and the workup stated above, it is thought that the etiology of the patients current presentation is suicide attempt, recommend overdose    ADMIT: Patient is thought to require admission for telemetry monitoring, psychiatric evaluation. Patient will be admitted to hospitalist service. Please see in patient provider note for additional treatment plan details.    The plan for this patient was discussed with Dr. Ashok Cordia, who voiced agreement and who oversaw evaluation and treatment of this patient.     Clinical Complexity  A medically appropriate history, review of systems, and physical exam was performed.   I personally reviewed the lab and imaging studies discussed above.   MDM generated using voice dictation software  and may contain dictation errors. Please contact me for any clarification or with any questions.              Final Clinical Impression(s) / ED Diagnoses Final diagnoses:  None    Rx / DC Orders ED Discharge Orders     None         Ladarious Kresse, [place], MD 05/03/22 4782    Lajean Saver, MD 05/05/22 1630

## 2022-05-02 NOTE — ED Notes (Signed)
Pt placed on end tidal

## 2022-05-02 NOTE — ED Notes (Signed)
This RN spoke to staffing - no sitters available at this time

## 2022-05-02 NOTE — ED Triage Notes (Signed)
Pt BIB EMS from home. Pt states she has been having a hard time with life recently and tonight had an argument with her husband. Pt took a lot of pills in an attempt to kill herself. EMS reports pt took 50 of zofran and unknown amounts of promethazine, folic acid, metoclopramide, hydrocodone - and possibly others - random pills scattered around floor.  Pt is AO VS with EMS  170/80 ST at 110 CBG 232

## 2022-05-03 DIAGNOSIS — T50902A Poisoning by unspecified drugs, medicaments and biological substances, intentional self-harm, initial encounter: Secondary | ICD-10-CM

## 2022-05-03 DIAGNOSIS — F4321 Adjustment disorder with depressed mood: Secondary | ICD-10-CM | POA: Diagnosis present

## 2022-05-03 LAB — RAPID URINE DRUG SCREEN, HOSP PERFORMED
Amphetamines: NOT DETECTED
Barbiturates: NOT DETECTED
Benzodiazepines: NOT DETECTED
Cocaine: NOT DETECTED
Opiates: POSITIVE — AB
Tetrahydrocannabinol: NOT DETECTED

## 2022-05-03 LAB — BASIC METABOLIC PANEL
Anion gap: 9 (ref 5–15)
BUN: 11 mg/dL (ref 6–20)
CO2: 25 mmol/L (ref 22–32)
Calcium: 9 mg/dL (ref 8.9–10.3)
Chloride: 102 mmol/L (ref 98–111)
Creatinine, Ser: 0.93 mg/dL (ref 0.44–1.00)
GFR, Estimated: >60 mL/min (ref >60)
Glucose, Bld: 240 mg/dL — ABNORMAL HIGH (ref 70–99)
Potassium: 4.1 mmol/L (ref 3.5–5.1)
Sodium: 136 mmol/L (ref 135–145)

## 2022-05-03 LAB — HEMOGLOBIN A1C
Hgb A1c MFr Bld: 8.4 % — ABNORMAL HIGH (ref 4.8–5.6)
Mean Plasma Glucose: 194.38 mg/dL

## 2022-05-03 LAB — CBG MONITORING, ED: Glucose-Capillary: 236 mg/dL — ABNORMAL HIGH (ref 70–99)

## 2022-05-03 LAB — URINALYSIS, ROUTINE W REFLEX MICROSCOPIC
Bacteria, UA: NONE SEEN
Bilirubin Urine: NEGATIVE
Glucose, UA: >=500 mg/dL — AB
Hgb urine dipstick: NEGATIVE
Ketones, ur: NEGATIVE mg/dL
Leukocytes,Ua: NEGATIVE
Nitrite: NEGATIVE
Protein, ur: NEGATIVE mg/dL
Specific Gravity, Urine: 1.02 (ref 1.005–1.030)
pH: 5 (ref 5.0–8.0)

## 2022-05-03 LAB — MAGNESIUM: Magnesium: 2.2 mg/dL (ref 1.7–2.4)

## 2022-05-03 LAB — CBC
HCT: 32.3 % — ABNORMAL LOW (ref 36.0–46.0)
Hemoglobin: 10.2 g/dL — ABNORMAL LOW (ref 12.0–15.0)
MCH: 26.3 pg (ref 26.0–34.0)
MCHC: 31.6 g/dL (ref 30.0–36.0)
MCV: 83.2 fL (ref 80.0–100.0)
Platelets: 356 10*3/uL (ref 150–400)
RBC: 3.88 MIL/uL (ref 3.87–5.11)
RDW: 15.2 % (ref 11.5–15.5)
WBC: 8.6 10*3/uL (ref 4.0–10.5)
nRBC: 0 % (ref 0.0–0.2)

## 2022-05-03 LAB — GLUCOSE, CAPILLARY
Glucose-Capillary: 267 mg/dL — ABNORMAL HIGH (ref 70–99)
Glucose-Capillary: 263 mg/dL — ABNORMAL HIGH (ref 70–99)
Glucose-Capillary: 237 mg/dL — ABNORMAL HIGH (ref 70–99)

## 2022-05-03 LAB — PREGNANCY, URINE: Preg Test, Ur: NEGATIVE

## 2022-05-03 LAB — HIV ANTIBODY (ROUTINE TESTING W REFLEX): HIV Screen 4th Generation wRfx: NONREACTIVE

## 2022-05-03 LAB — ACETAMINOPHEN LEVEL: Acetaminophen (Tylenol), Serum: <10 ug/mL — ABNORMAL LOW (ref 10–30)

## 2022-05-03 MED ORDER — TIZANIDINE HCL 4 MG PO TABS: 4.0000 mg | ORAL_TABLET | Freq: Every evening | ORAL | Status: DC | PRN

## 2022-05-03 MED ORDER — INSULIN GLARGINE-YFGN 100 UNIT/ML ~~LOC~~ SOLN
15.0000 [IU] | Freq: Every day | SUBCUTANEOUS | Status: DC
  Filled 2022-05-03: qty 0.15

## 2022-05-03 NOTE — ED Notes (Signed)
Danielle with poison control given update on pt

## 2022-05-03 NOTE — Discharge Summary (Signed)
Physician Discharge Summary  Tiffany [name] VHQ:[id number] DOB: 09-12-1970 DOA: 05/02/2022  PCP: Pcp, No  Admit date: 05/02/2022 Discharge date: 05/03/2022  Admitted From: Home Disposition: Home  Recommendations for Outpatient Follow-up:  Follow up with PCP in 1 week  Recommend outpatient evaluation and follow-up by psychiatry Follow up in ED if symptoms worsen or new appear   Home Health: No Equipment/Devices: None  Discharge Condition: Stable CODE STATUS: Full Diet recommendation: Carb modified  Brief/Interim Summary: 51 y.o. female with medical history significant for insulin-dependent diabetes mellitus, diabetic gastroparesis, and rheumatoid arthritis presented with intentional medication overdose with unknown quantity of multiple antiemetics, Norco and possibly other prescription medications.  On presentation, EKG showed sinus tachycardia.  Poison control recommended observation for 12 to 14 hours and to maintain potassium greater than 4 and magnesium greater than 2 and EKG every 4 hours.  She was given IV fluids.  She has subsequently remained hemodynamically stable with normal potassium, magnesium and EKG.  Psychiatry recommended no inpatient psychiatric hospitalization and recommended social worker consult to facilitate referral for outpatient counseling upon discharge.  Social worker consulted. -Currently medically stable.  Discharge patient home today  Discharge Diagnoses:   Intentional medication overdose/suicidal intent -Presented after intentional ingestion of multiple antiemetics and possibly Norco and other medications in attempt to harm herself  -IVC'd by PD prior to arrival -Continue suicide precautions with one-to-one observation. -Poison control recommended observation for 12 to 14 hours and to maintain potassium greater than 4 and magnesium greater than 2 and EKG every 4 hours.  Potassium and magnesium currently stable.  Latest EKG this morning reviewed by myself and  does not show any QT elongation. -Psychiatry recommended no inpatient psychiatric hospitalization and recommended social worker consult to facilitate referral for outpatient counseling upon discharge.  Social worker consulted. -Currently medically stable.  Discharge patient home today with outpatient follow-up with PCP.  Consider outpatient evaluation and follow-up by psychiatry.   Normocytic anemia--questionable cause.  Hemoglobin stable.  Outpatient follow-up.   Diabetes mellitus type 1 with hyperglycemia -A1c 8.4.  Carb modified diet.  Continue home regimen.  Gastroparesis -Outpatient follow-up with PCP.  Discharge Instructions   Allergies as of 05/03/2022       Reactions   Morphine Itching, Hives   Duloxetine Hcl    Other reaction(s): constipation   Gabapentin    Leg Pain   Morphine And Related Hives, Itching   Tramadol Hives, Itching        Medication List     STOP taking these medications    fluticasone 50 MCG/ACT nasal spray Commonly known as: FLONASE   hydrocortisone 25 MG suppository Commonly known as: ANUSOL-HC   ipratropium 0.06 % nasal spray Commonly known as: ATROVENT   tiZANidine 4 MG tablet Commonly known as: Zanaflex   zolpidem 12.5 MG CR tablet Commonly known as: Ambien CR       TAKE these medications    albuterol 108 (90 Base) MCG/ACT inhaler Commonly known as: VENTOLIN HFA Inhale 1-2 puffs into the lungs every 6 (six) hours as needed for wheezing or shortness of breath.   aluminum chloride 20 % external solution Commonly known as: DRYSOL Apply topically every other day. For sweating. What changed: how much to take   Apidra 100 UNIT/ML injection Generic drug: insulin glulisine Inject via insulin pump up to 60 units daily for T1DM   Apidra SoloStar 100 UNIT/ML Solostar Pen Generic drug: insulin glulisine Inject into the skin.   atorvastatin 40 MG tablet Commonly known  as: LIPITOR Take 1 tablet (40 mg total) by mouth every  morning.   bisacodyl 5 MG EC tablet Commonly known as: DULCOLAX Take 5 mg by mouth daily as needed for mild constipation.   Dexcom G6 Sensor Misc Apply 1 sensor to the skin every 10 days for continuous glucose monitoring.   Dexcom G6 Transmitter Misc Use as directed for continuous glucose monitoring. Reuse transmitter for 90 days then discard and replace.   fluticasone 44 MCG/ACT inhaler Commonly known as: Flovent HFA Inhale 2 puffs into the lungs 2 (two) times daily.   folic acid 1 MG tablet Commonly known as: FOLVITE Take 1 mg by mouth daily.   glucagon 1 MG injection Inject 1 mg into the skin once as needed (low blood sugar).   Keto-Diastix Strp Use to check urine for ketones with high blood glucose   Lantus SoloStar 100 UNIT/ML Solostar Pen Generic drug: insulin glargine Inject 20 Units into the skin daily. If pump failure, take 20 units   linaclotide 290 MCG Caps capsule Commonly known as: LINZESS Take 290 mcg by mouth daily.   methotrexate 2.5 MG tablet Commonly known as: RHEUMATREX Take 15 mg by mouth once a week. Take 6 tablets (12.5 mg total) by mouth once a week   Multi-Vitamins Tabs Take 1 tablet by mouth daily.   OneTouch Ultra test strip Generic drug: glucose blood TEST 4 TO 6 TIMES DAILY AS DIRECTED   pantoprazole 40 MG tablet Commonly known as: PROTONIX Take 1 tablet (40 mg total) by mouth daily.   SUMAtriptan 100 MG tablet Commonly known as: IMITREX Take 100 mg by mouth as needed for migraine.           Follow-up Information     PCP. Schedule an appointment as soon as possible for a visit in 1 week(s).                 Allergies  Allergen Reactions   Morphine Itching and Hives   Duloxetine Hcl     Other reaction(s): constipation   Gabapentin     Leg Pain   Morphine And Related Hives and Itching   Tramadol Hives and Itching    Consultations: Psychiatry   Procedures/Studies: No results  found.    Subjective: Patient seen and examined at bedside.  No fever, vomiting, chest pain or abdominal pain reported.  Poor historian.  Slow to respond.  Discharge Exam: Vitals:   05/03/22 0822 05/03/22 1205  BP: 120/70 124/68  Pulse: 99 94  Resp: 17 16  Temp: 98.7 F (37.1 C) 98.4 F (36.9 C)  SpO2: 99% 96%    General exam: Appears calm and comfortable.  Currently on room air. Respiratory system: Bilateral decreased breath sounds at bases, no wheezing Cardiovascular system: Mild intermittent tachycardia present, rate controlled Gastrointestinal system: Abdomen is nondistended, soft and nontender. Normal bowel sounds heard. Extremities: No cyanosis, clubbing, edema  Central nervous system: Alert and oriented.  Slow to respond.  Poor historian.  No focal neurological deficits. Moving extremities Skin: No rashes, lesions or ulcers Psychiatry: Affect is mostly flat.  No signs of agitation.  Does not participate in conversation much.      The results of significant diagnostics from this hospitalization (including imaging, microbiology, ancillary and laboratory) are listed below for reference.     Microbiology: No results found for this or any previous visit (from the past 240 hour(s)).   Labs: BNP (last 3 results) No results for input(s): "BNP" in the last 8760  hours. Basic Metabolic Panel: Recent Labs  Lab 05/02/22 2028 05/03/22 0448  NA 138 136  K 3.8 4.1  CL 101 102  CO2 24 25  GLUCOSE 281* 240*  BUN 16 11  CREATININE 0.83 0.93  CALCIUM 9.3 9.0  MG 1.9 2.2   Liver Function Tests: Recent Labs  Lab 05/02/22 2028  AST 30  ALT 15  ALKPHOS 80  BILITOT 0.5  PROT 7.0  ALBUMIN 3.7   No results for input(s): "LIPASE", "AMYLASE" in the last 168 hours. No results for input(s): "AMMONIA" in the last 168 hours. CBC: Recent Labs  Lab 05/02/22 2028 05/03/22 0448  WBC 7.7 8.6  HGB 10.6* 10.2*  HCT 35.4* 32.3*  MCV 86.6 83.2  PLT 381 356   Cardiac  Enzymes: No results for input(s): "CKTOTAL", "CKMB", "CKMBINDEX", "TROPONINI" in the last 168 hours. BNP: Invalid input(s): "POCBNP" CBG: Recent Labs  Lab 05/03/22 0040 05/03/22 0404 05/03/22 0736 05/03/22 1232  GLUCAP 236* 267* 263* 237*   D-Dimer No results for input(s): "DDIMER" in the last 72 hours. Hgb A1c Recent Labs    05/03/22 0127  HGBA1C 8.4*   Lipid Profile No results for input(s): "CHOL", "HDL", "LDLCALC", "TRIG", "CHOLHDL", "LDLDIRECT" in the last 72 hours. Thyroid function studies No results for input(s): "TSH", "T4TOTAL", "T3FREE", "THYROIDAB" in the last 72 hours.  Invalid input(s): "FREET3" Anemia work up No results for input(s): "VITAMINB12", "FOLATE", "FERRITIN", "TIBC", "IRON", "RETICCTPCT" in the last 72 hours. Urinalysis    Component Value Date/Time   COLORURINE YELLOW 05/03/2022 0410   APPEARANCEUR CLEAR 05/03/2022 0410   APPEARANCEUR Cloudy (A) 12/05/2020 1257   LABSPEC 1.020 05/03/2022 0410   PHURINE 5.0 05/03/2022 0410   GLUCOSEU >=500 (A) 05/03/2022 0410   HGBUR NEGATIVE 05/03/2022 0410   BILIRUBINUR NEGATIVE 05/03/2022 0410   BILIRUBINUR Negative 12/05/2020 1257   KETONESUR NEGATIVE 05/03/2022 0410   PROTEINUR NEGATIVE 05/03/2022 0410   UROBILINOGEN 0.2 11/21/2020 0950   UROBILINOGEN 0.2 08/27/2013 0226   NITRITE NEGATIVE 05/03/2022 0410   LEUKOCYTESUR NEGATIVE 05/03/2022 0410   Sepsis Labs Recent Labs  Lab 05/02/22 2028 05/03/22 0448  WBC 7.7 8.6   Microbiology No results found for this or any previous visit (from the past 240 hour(s)).   Time coordinating discharge: 35 minutes  SIGNED:   Aline August, MD  [facility] 05/03/2022, 1:36 PM

## 2022-05-03 NOTE — ED Notes (Signed)
Pt belongings and IVC paperwork taken upstairs with patient and hand off with 3W RN

## 2022-05-03 NOTE — TOC Transition Note (Signed)
Transition of Care [facility]) - CM/SW Discharge Note   Patient Details  Name: Tiffany [name] MRN: [id number] Date of Birth: 31-Jul-1970  Transition of Care [facility]) CM/SW Contact:  Bartholomew Crews, RN Phone Number: [phone] 05/03/2022, 3:23 PM   Clinical Narrative:     Spoke with patient at the bedside to discuss post acute transition. Patient stated that she has PCP, Dr. Einar Pheasant at [facility]. Discussed [place] resources that are both inpatient and outpatient. Encouraged to call to discuss different options. Patient stated that she has transportation home. No further TOC needs identified at this time.   Final next level of care: Home/Self Care Barriers to Discharge: No Barriers Identified   Patient Goals and CMS Choice Patient states their goals for this hospitalization and ongoing recovery are:: return home CMS Medicare.gov Compare Post Acute Care list provided to:: Patient Choice offered to / list presented to : NA  Discharge Placement                       Discharge Plan and Services                DME Arranged: N/A DME Agency: NA       HH Arranged: NA HH Agency: NA        Social Determinants of Health (SDOH) Interventions     Readmission Risk Interventions     No data to display

## 2022-05-03 NOTE — ED Notes (Addendum)
Pt states she does not know how to turn insulin pump off at this time - will continue to try and figure out how to turn off pump - MD Opyd notified

## 2022-05-03 NOTE — ED Notes (Signed)
Pt family is calling for an update - Patient does not want family to be updated at this time

## 2022-05-03 NOTE — ED Notes (Signed)
Per MD Opyd regarding EKG orders - It's every 4 hours starting at 2346 for 5 occurances

## 2022-05-03 NOTE — Consult Note (Signed)
Start Psychiatry Consult   Reason for Consult: ''Intentional overdose'' Referring Physician:  Aline August, MD Patient Identification: Tiffany [name] MRN:  [id number] Principal Diagnosis: Intentional overdose [facility]) Diagnosis:  Principal Problem:   Intentional overdose ([place]) Active Problems:   Type 1 diabetes mellitus with complications ([place])   Gastroparesis due to DM ([place])   Rheumatoid arthritis ([place])   Adjustment disorder with depressed mood   Total Time spent with patient: 1 hour  Subjective:   Tiffany [name] is a 51 y.o. female patient admitted with intentional overdose.  HPI:  Patient is a 51 y.o. female with medical history significant for insulin-dependent diabetes mellitus, diabetic gastroparesis, and rheumatoid arthritis who was admitted to the hospital after she intentionally overdosed on a bunch of her medications. Today, patient is alert, awake, calm, cooperative and oriented x 4. She denies prior history of mental illness, suicide attempt,alcohol and illicit drug use. However, patient reports recent multiple stressors in her life. 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 She reports the issues was so bad causing a bad argument between her and husband which lead to her overdose in anger. Patient denies trying to kill herself, she states that she overdose out of anger and she is very pleased that she is alive. She reports situational depression, frustrations, and difficulty sleeping due to the money issues but denies psychosis, delusions, and self harming thoughts. She agreed to talk to a therapist upon discharge but does not agreed with taking anti-depressant.  Past Psychiatric History: none reported  Risk to Self:  denies Risk to Others:  denies Prior Inpatient Therapy:  none Prior Outpatient Therapy:  none  Past Medical History:  Past Medical History:  Diagnosis Date    Arthritis    knees, lower back right side   Connective tissue disease ([place])    Depression    Diabetes mellitus    Diabetes mellitus type 1, uncontrolled, insulin dependent 04/03/2017   Fibromyalgia 12/02/2017   G6PD deficiency 10/26/2017   Gastroparesis    Headache(784.0)    Heart murmur    as a child - no problem as adult   History of kidney stones    no surgery   Hyperlipidemia    Lupus ([place])    Migraines 06/05/2020   Rheumatoid arthritis ([place]) 06/05/2020   Sjogren's syndrome without extraglandular involvement ([place]) 02/24/2018    Past Surgical History:  Procedure Laterality Date   CESAREAN SECTION  1994, 2006   x 2   COLONOSCOPY WITH PROPOFOL N/A 03/16/2013   Procedure: COLONOSCOPY WITH PROPOFOL;  Surgeon: Arta Silence, MD;  Location: [facility];  Service: Endoscopy;  Laterality: N/A;   HYSTEROSCOPY WITH D & C N/A 07/11/2013   Procedure: DILATATION AND CURETTAGE /HYSTEROSCOPY;  Surgeon: Marvene Staff, MD;  Location: [facility];  Service: Gynecology;  Laterality: N/A;   left surgery shoulder     ROBOTIC ASSISTED LAPAROSCOPIC LYSIS OF ADHESION N/A 07/11/2013   Procedure: ROBOTIC ASSISTED LAPAROSCOPIC LYSIS OF ADHESIONS ; REPAIR OF UTERINE DEHISENCE;  Surgeon: Marvene Staff, MD;  Location: [facility];  Service: Gynecology;  Laterality: N/A;   TUBAL LIGATION     Family History:  Family History  Problem Relation Age of Onset   Diabetes Sister    Arthritis Sister    Hyperlipidemia Sister    Lung cancer Mother    Cancer Mother        ?Uterine cancer   Alzheimer's  disease Maternal Grandmother    Family Psychiatric  History: none reported by patient Social History:  Social History   Substance and Sexual Activity  Alcohol Use No     Social History   Substance and Sexual Activity  Drug Use No    Social History   Socioeconomic History   Marital status: Married    Spouse name: [name]   Number of children: 2   Years of education: some college   Highest education  level: Not on file  Occupational History   Occupation: Unemployed  Tobacco Use   Smoking status: Never   Smokeless tobacco: Never  Vaping Use   Vaping Use: Never used  Substance and Sexual Activity   Alcohol use: No   Drug use: No   Sexual activity: Yes    Birth control/protection: Surgical  Other Topics Concern   Not on file  Social History Narrative   06/05/20   From: [place] and moved to [place] in 2003   Living: with husband, [name] (2002) and daughter   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       Family: 2 daughters [name] and [name] (2006)      Enjoys: going out to eat      Exercise: joint pain limits activity   Diet: tries to followed diabetic diet      Safety   Seat belts: Yes    Guns: Yes  and secure   Safe in relationships: Yes    Social Determinants of Health   Financial Resource Strain: Not on file  Food Insecurity: Not on file  Transportation Needs: Not on file  Physical Activity: Not on file  Stress: Stress Concern Present (03/03/2022)   [place]    Feeling of Stress : Rather much  Social Connections: Not on file   Additional Social History:    Allergies:   Allergies  Allergen Reactions   Morphine Itching and Hives   Duloxetine Hcl     Other reaction(s): constipation   Gabapentin     Leg Pain   Morphine And Related Hives and Itching   Tramadol Hives and Itching    Labs:  Results for orders placed or performed during the hospital encounter of 05/02/22 (from the past 48 hour(s))  Comprehensive metabolic panel     Status: Abnormal   Collection Time: 05/02/22  8:28 PM  Result Value Ref Range   Sodium 138 135 - 145 mmol/L   Potassium 3.8 3.5 - 5.1 mmol/L   Chloride 101 98 - 111 mmol/L   CO2 24 22 - 32 mmol/L   Glucose, Bld 281 (H) 70 - 99 mg/dL    Comment: Glucose reference range applies only to samples taken after fasting for at least 8 hours.    BUN 16 6 - 20 mg/dL   Creatinine, Ser 0.83 0.44 - 1.00 mg/dL   Calcium 9.3 8.9 - 10.3 mg/dL   Total Protein 7.0 6.5 - 8.1 g/dL   Albumin 3.7 3.5 - 5.0 g/dL   AST 30 15 - 41 U/L   ALT 15 0 - 44 U/L   Alkaline Phosphatase 80 38 - 126 U/L   Total Bilirubin 0.5 0.3 - 1.2 mg/dL   GFR, Estimated >60 >60 mL/min    Comment: (NOTE) Calculated using the CKD-EPI Creatinine Equation (2021)    Anion gap 13 5 - 15    Comment: Performed at [facility], [address] [place], [place] [address]  CBC     Status: Abnormal   Collection Time: 05/02/22  8:28 PM  Result Value Ref Range   WBC 7.7 4.0 - 10.5 K/uL   RBC 4.09 3.87 - 5.11 MIL/uL   Hemoglobin 10.6 (L) 12.0 - 15.0 g/dL   HCT 35.4 (L) 36.0 - 46.0 %   MCV 86.6 80.0 - 100.0 fL   MCH 25.9 (L) 26.0 - 34.0 pg   MCHC 29.9 (L) 30.0 - 36.0 g/dL   RDW 15.5 11.5 - 15.5 %   Platelets 381 150 - 400 K/uL   nRBC 0.0 0.0 - 0.2 %    Comment: Performed at [facility], [place] [address] [place], [place] [address]  Ethanol     Status: None   Collection Time: 05/02/22  8:28 PM  Result Value Ref Range   Alcohol, Ethyl (B) <10 <10 mg/dL    Comment: (NOTE) Lowest detectable limit for serum alcohol is 10 mg/dL.  For medical purposes only. Performed at [facility], [place] [address], [place], [place] [address]   Salicylate level     Status: Abnormal   Collection Time: 05/02/22  8:28 PM  Result Value Ref Range   Salicylate Lvl <8.5 (L) 7.0 - 30.0 mg/dL    Comment: Performed at [facility] [address], [place], [place] [address]  Magnesium     Status: None   Collection Time: 05/02/22  8:28 PM  Result Value Ref Range   Magnesium 1.9 1.7 - 2.4 mg/dL    Comment: Performed at [facility], [place] [address], [place], [place] [address]  CBG monitoring, ED     Status: Abnormal   Collection Time: 05/03/22 12:40 AM  Result Value Ref Range   Glucose-Capillary 236 (H) 70 - 99 mg/dL    Comment: Glucose reference range applies  only to samples taken after fasting for at least 8 hours.  Acetaminophen level     Status: Abnormal   Collection Time: 05/03/22  1:27 AM  Result Value Ref Range   Acetaminophen (Tylenol), Serum <10 (L) 10 - 30 ug/mL    Comment: (NOTE) Therapeutic concentrations vary significantly. A range of 10-30 ug/mL  may be an effective concentration for many patients. However, some  are best treated at concentrations outside of this range. Acetaminophen concentrations >150 ug/mL at 4 hours after ingestion  and >50 ug/mL at 12 hours after ingestion are often associated with  toxic reactions.  Performed at [facility], [place] [address], [place], [place] [address]   Hemoglobin A1c     Status: Abnormal   Collection Time: 05/03/22  1:27 AM  Result Value Ref Range   Hgb A1c MFr Bld 8.4 (H) 4.8 - 5.6 %    Comment: (NOTE) Pre diabetes:          5.7%-6.4%  Diabetes:              >6.4%  Glycemic control for   <7.0% adults with diabetes    Mean Plasma Glucose 194.38 mg/dL    Comment: Performed at [facility] [address] [place], [place] [address]  Glucose, capillary     Status: Abnormal   Collection Time: 05/03/22  4:04 AM  Result Value Ref Range   Glucose-Capillary 267 (H) 70 - 99 mg/dL    Comment: Glucose reference range applies only to samples taken after fasting for at least 8 hours.  Urinalysis, Routine w  reflex microscopic Urine, Clean Catch     Status: Abnormal   Collection Time: 05/03/22  4:10 AM  Result Value Ref Range   Color, Urine YELLOW YELLOW   APPearance CLEAR CLEAR   Specific Gravity, Urine 1.020 1.005 - 1.030   pH 5.0 5.0 - 8.0   Glucose, UA >=500 (A) NEGATIVE mg/dL   Hgb urine dipstick NEGATIVE NEGATIVE   Bilirubin Urine NEGATIVE NEGATIVE   Ketones, ur NEGATIVE NEGATIVE mg/dL   Protein, ur NEGATIVE NEGATIVE mg/dL   Nitrite NEGATIVE NEGATIVE   Leukocytes,Ua NEGATIVE NEGATIVE   RBC / HPF 0-5 0 - 5 RBC/hpf   WBC, UA 0-5 0 - 5 WBC/hpf   Bacteria, UA NONE  SEEN NONE SEEN   Squamous Epithelial / LPF 0-5 0 - 5   Mucus PRESENT     Comment: Performed at [facility], [place] [address] [place], [place] [address]  Pregnancy, urine     Status: None   Collection Time: 05/03/22  4:10 AM  Result Value Ref Range   Preg Test, Ur NEGATIVE NEGATIVE    Comment: Performed at [facility] [address], [place], [place] [address]  Rapid urine drug screen (hospital performed)     Status: Abnormal   Collection Time: 05/03/22  4:10 AM  Result Value Ref Range   Opiates POSITIVE (A) NONE DETECTED   Cocaine NONE DETECTED NONE DETECTED   Benzodiazepines NONE DETECTED NONE DETECTED   Amphetamines NONE DETECTED NONE DETECTED   Tetrahydrocannabinol NONE DETECTED NONE DETECTED   Barbiturates NONE DETECTED NONE DETECTED    Comment: (NOTE) DRUG SCREEN FOR MEDICAL PURPOSES ONLY.  IF CONFIRMATION IS NEEDED FOR ANY PURPOSE, NOTIFY LAB WITHIN 5 DAYS.  LOWEST DETECTABLE LIMITS FOR URINE DRUG SCREEN Drug Class                     Cutoff (ng/mL) Amphetamine and metabolites    1000 Barbiturate and metabolites    200 Benzodiazepine                 749 Tricyclics and metabolites     300 Opiates and metabolites        300 Cocaine and metabolites        300 THC                            50 Performed at [facility], [place] [address], [place], [place] [address]   HIV Antibody (routine testing w rflx)     Status: None   Collection Time: 05/03/22  4:48 AM  Result Value Ref Range   HIV Screen 4th Generation wRfx Non Reactive Non Reactive    Comment: Performed at [facility], [place] [address], [place], [place] [address]  Basic metabolic panel     Status: Abnormal   Collection Time: 05/03/22  4:48 AM  Result Value Ref Range   Sodium 136 135 - 145 mmol/L   Potassium 4.1 3.5 - 5.1 mmol/L   Chloride 102 98 - 111 mmol/L   CO2 25 22 - 32 mmol/L   Glucose, Bld 240 (H) 70 - 99 mg/dL    Comment: Glucose reference range applies only to samples taken  after fasting for at least 8 hours.   BUN 11 6 - 20 mg/dL   Creatinine, Ser 0.93 0.44 - 1.00 mg/dL   Calcium 9.0 8.9 - 10.3 mg/dL   GFR, Estimated >60 >  60 mL/min    Comment: (NOTE) Calculated using the CKD-EPI Creatinine Equation (2021)    Anion gap 9 5 - 15    Comment: Performed at [facility], [place] [address], [place], [place] [address]  Magnesium     Status: None   Collection Time: 05/03/22  4:48 AM  Result Value Ref Range   Magnesium 2.2 1.7 - 2.4 mg/dL    Comment: Performed at [facility] [address], [place],  [address]  CBC     Status: Abnormal   Collection Time: 05/03/22  4:48 AM  Result Value Ref Range   WBC 8.6 4.0 - 10.5 K/uL   RBC 3.88 3.87 - 5.11 MIL/uL   Hemoglobin 10.2 (L) 12.0 - 15.0 g/dL   HCT 32.3 (L) 36.0 - 46.0 %   MCV 83.2 80.0 - 100.0 fL   MCH 26.3 26.0 - 34.0 pg   MCHC 31.6 30.0 - 36.0 g/dL   RDW 15.2 11.5 - 15.5 %   Platelets 356 150 - 400 K/uL   nRBC 0.0 0.0 - 0.2 %    Comment: Performed at [facility], [place] [address], [place], [place] [address]  Glucose, capillary     Status: Abnormal   Collection Time: 05/03/22  7:36 AM  Result Value Ref Range   Glucose-Capillary 263 (H) 70 - 99 mg/dL    Comment: Glucose reference range applies only to samples taken after fasting for at least 8 hours.  Glucose, capillary     Status: Abnormal   Collection Time: 05/03/22 12:32 PM  Result Value Ref Range   Glucose-Capillary 237 (H) 70 - 99 mg/dL    Comment: Glucose reference range applies only to samples taken after fasting for at least 8 hours.   Comment 1 Notify RN    Comment 2 Document in Chart     Current Facility-Administered Medications  Medication Dose Route Frequency Provider Last Rate Last Admin   enoxaparin (LOVENOX) injection 40 mg  40 mg Subcutaneous Q24H Opyd, Ilene Qua, MD       insulin aspart (novoLOG) injection 0-5 Units  0-5 Units Subcutaneous QHS Opyd, Ilene Qua, MD   2 Units at 05/03/22 0059   insulin aspart  (novoLOG) injection 0-9 Units  0-9 Units Subcutaneous TID WC Opyd, Ilene Qua, MD   3 Units at 05/03/22 1238   insulin aspart (novoLOG) injection 3 Units  3 Units Subcutaneous TID WC Opyd, Ilene Qua, MD   3 Units at 05/03/22 1238   insulin glargine-yfgn (SEMGLEE) injection 15 Units  15 Units Subcutaneous QHS Alekh, Kshitiz, MD       sodium chloride flush (NS) 0.9 % injection 3 mL  3 mL Intravenous Q12H Opyd, Ilene Qua, MD   3 mL at 05/03/22 1015    Musculoskeletal: Strength & Muscle Tone: within normal limits Gait & Station: normal Patient leans: N/A    Psychiatric Specialty Exam:  Presentation  General Appearance:  Appropriate for Environment  Eye Contact: Good  Speech: Clear and Coherent; Normal Rate  Speech Volume: Decreased  Handedness: Right   Mood and Affect  Mood: Dysphoric  Affect: Constricted   Thought Process  Thought Processes: Goal Directed; Linear  Descriptions of Associations:Intact  Orientation:Full (Time, Place and Person)  Thought Content:Logical  History of Schizophrenia/Schizoaffective disorder:No data recorded Duration of Psychotic Symptoms:No data recorded Hallucinations:Hallucinations: None  Ideas of Reference:None  Suicidal Thoughts:Suicidal Thoughts: No  Homicidal Thoughts:Homicidal Thoughts: No   Sensorium  Memory: Immediate Good; Recent  Good; Remote Good  Judgment: Intact  Insight: Fair   Materials engineer: Good  Attention Span: Good  Recall: Good  Fund of Knowledge: Good  Language:No data recorded  Psychomotor Activity  Psychomotor Activity: Psychomotor Activity: Decreased   Assets  Assets: Desire for Improvement; Communication Skills   Sleep  Sleep: Sleep: Fair   Physical Exam: Physical Exam Review of Systems  Psychiatric/Behavioral:  Positive for depression.    Blood pressure 124/68, pulse 94, temperature 98.4 F (36.9 C), temperature source Oral, resp. rate 16,  height '5\' 6"'$  (1.676 m), weight 67.6 kg, SpO2 96 %. Body mass index is 24.05 kg/m.  Treatment Plan Summary: 51 year old female with multiple medical issues who denies prior history of mental illness and suicide attempt. She was admitted to the hospital after she overdose on her medications. However, she denies trying to kill herself but says she overdosed out of anger and she is grateful to God that she did not die. Patient refused anti-depressant but agreed to see a therapist upon discharge.  Medication management: Patient refused  Plan: -Continue 1:1 Sitter for safety -Consider social worker consult to facilitate referral for  outpatient counseling upon discharge   Disposition: No evidence of imminent risk to self or others at present.   Patient does not meet criteria for psychiatric inpatient admission. Supportive therapy provided about ongoing stressors. Psychiatric service signing off. Re-consult as needed  Corena Pilgrim, MD 05/03/2022 12:54 PM

## 2022-05-03 NOTE — Progress Notes (Addendum)
PROGRESS NOTE    Tiffany [name]  OIZ:[id number] DOB: [birth year] DOA: 05/02/2022 PCP: Pcp, No   Brief Narrative:  51 y.o. female with medical history significant for insulin-dependent diabetes mellitus, diabetic gastroparesis, and rheumatoid arthritis presented with intentional medication overdose with unknown quantity of multiple antiemetics, Norco and possibly other prescription medications.  On presentation, EKG showed sinus tachycardia.  Poison control recommended observation for 12 to 14 hours and to maintain potassium greater than 4 and magnesium greater than 2 and EKG every 4 hours.  She was given IV fluids.  Assessment & Plan:   Intentional medication overdose/suicidal intent -Presented after intentional ingestion of multiple antiemetics and possibly Norco and other medications in attempt to harm herself  -IVC'd by PD prior to arrival -Continue suicide precautions with one-to-one observation. -Poison control recommended observation for 12 to 14 hours and to maintain potassium greater than 4 and magnesium greater than 2 and EKG every 4 hours.  Potassium and magnesium currently stable.  Latest EKG this morning reviewed by myself and does not show any QT elongation. -I have consulted psychiatry.  Might need inpatient psychiatric hospitalization. -Currently medically stable.  Normocytic anemia--questionable cause.  Hemoglobin stable  Diabetes mellitus type 1 with hyperglycemia -A1c 8.4.  Continue long-acting insulin, short-acting insulin along with CBGs with SSI  Gastroparesis -Use antiemetics as needed.  Outpatient follow-up with PCP.   DVT prophylaxis: Lovenox Code Status: Full Family Communication: None at bedside Disposition Plan: Status is: Observation The patient will require care spanning > 2 midnights and should be moved to inpatient because: Might need inpatient psychiatric hospitalization pending psych evaluation  Consultants: Psychiatry  Procedures:  None  Antimicrobials: None   Subjective: Patient seen and examined at bedside.  No fever, vomiting, chest pain or abdominal pain reported.  Poor historian.  Slow to respond.  Objective: Vitals:   05/03/22 0300 05/03/22 0321 05/03/22 0353 05/03/22 0822  BP: 137/76  128/78 120/70  Pulse: 96  (!) 105 99  Resp: '17  17 17  '$ Temp:  98.1 F (36.7 C) 98.4 F (36.9 C) 98.7 F (37.1 C)  TempSrc:  Oral Oral Oral  SpO2: 98%  98% 99%  Weight:      Height:        Intake/Output Summary (Last 24 hours) at 05/03/2022 1000 Last data filed at 05/03/2022 0400 Gross per 24 hour  Intake 1091.38 ml  Output --  Net 1091.38 ml   Filed Weights   05/02/22 2034  Weight: 67.6 kg    Examination:  General exam: Appears calm and comfortable.  Currently on room air. Respiratory system: Bilateral decreased breath sounds at bases, no wheezing Cardiovascular system: Mild intermittent tachycardia present, rate controlled Gastrointestinal system: Abdomen is nondistended, soft and nontender. Normal bowel sounds heard. Extremities: No cyanosis, clubbing, edema  Central nervous system: Alert and oriented.  Slow to respond.  Poor historian.  No focal neurological deficits. Moving extremities Skin: No rashes, lesions or ulcers Psychiatry: Affect is mostly flat.  No signs of agitation.  Does not participate in conversation much.   Data Reviewed: I have personally reviewed following labs and imaging studies  CBC: Recent Labs  Lab 05/02/22 2028 05/03/22 0448  WBC 7.7 8.6  HGB 10.6* 10.2*  HCT 35.4* 32.3*  MCV 86.6 83.2  PLT 381 338   Basic Metabolic Panel: Recent Labs  Lab 05/02/22 2028 05/03/22 0448  NA 138 136  K 3.8 4.1  CL 101 102  CO2 24 25  GLUCOSE 281* 240*  BUN  16 11  CREATININE 0.83 0.93  CALCIUM 9.3 9.0  MG 1.9 2.2   GFR: Estimated Creatinine Clearance: 67 mL/min (by C-G formula based on SCr of 0.93 mg/dL). Liver Function Tests: Recent Labs  Lab 05/02/22 2028  AST 30  ALT  15  ALKPHOS 80  BILITOT 0.5  PROT 7.0  ALBUMIN 3.7   No results for input(s): "LIPASE", "AMYLASE" in the last 168 hours. No results for input(s): "AMMONIA" in the last 168 hours. Coagulation Profile: No results for input(s): "INR", "PROTIME" in the last 168 hours. Cardiac Enzymes: No results for input(s): "CKTOTAL", "CKMB", "CKMBINDEX", "TROPONINI" in the last 168 hours. BNP (last 3 results) No results for input(s): "PROBNP" in the last 8760 hours. HbA1C: Recent Labs    05/03/22 0127  HGBA1C 8.4*   CBG: Recent Labs  Lab 05/03/22 0040 05/03/22 0404  GLUCAP 236* 267*   Lipid Profile: No results for input(s): "CHOL", "HDL", "LDLCALC", "TRIG", "CHOLHDL", "LDLDIRECT" in the last 72 hours. Thyroid Function Tests: No results for input(s): "TSH", "T4TOTAL", "FREET4", "T3FREE", "THYROIDAB" in the last 72 hours. Anemia Panel: No results for input(s): "VITAMINB12", "FOLATE", "FERRITIN", "TIBC", "IRON", "RETICCTPCT" in the last 72 hours. Sepsis Labs: No results for input(s): "PROCALCITON", "LATICACIDVEN" in the last 168 hours.  No results found for this or any previous visit (from the past 240 hour(s)).       Radiology Studies: No results found.      Scheduled Meds:  enoxaparin (LOVENOX) injection  40 mg Subcutaneous Q24H   insulin aspart  0-5 Units Subcutaneous QHS   insulin aspart  0-9 Units Subcutaneous TID WC   insulin aspart  3 Units Subcutaneous TID WC   insulin glargine-yfgn  10 Units Subcutaneous QHS   sodium chloride flush  3 mL Intravenous Q12H   Continuous Infusions:        Aline August, MD [facility] 05/03/2022, 10:00 AM

## 2022-05-03 NOTE — ED Notes (Signed)
Pharmacy called with help to remove pt insulin pump - pt has pod, entire pod removed at this time

## 2022-05-03 NOTE — ED Notes (Signed)
Attempted to call 3W for report x2

## 2022-05-07 DIAGNOSIS — E1043 Type 1 diabetes mellitus with diabetic autonomic (poly)neuropathy: Secondary | ICD-10-CM | POA: Diagnosis not present

## 2022-06-10 DIAGNOSIS — K3184 Gastroparesis: Secondary | ICD-10-CM | POA: Diagnosis not present

## 2022-06-10 DIAGNOSIS — Z9641 Presence of insulin pump (external) (internal): Secondary | ICD-10-CM | POA: Diagnosis not present

## 2022-06-10 DIAGNOSIS — E1065 Type 1 diabetes mellitus with hyperglycemia: Secondary | ICD-10-CM | POA: Diagnosis not present

## 2022-06-10 DIAGNOSIS — E1043 Type 1 diabetes mellitus with diabetic autonomic (poly)neuropathy: Secondary | ICD-10-CM | POA: Diagnosis not present

## 2022-06-16 ENCOUNTER — Telehealth: Payer: Self-pay | Admitting: Family Medicine

## 2022-06-16 NOTE — Telephone Encounter (Signed)
Patient called in stating that she had some vomiting this morning,which was very dark and black. She said it only happened once,and she didn't have any other symptoms,however wanted to speak to a nurse. I sent her to be triaged.

## 2022-06-16 NOTE — Telephone Encounter (Signed)
Agree with precautions given to pt  Agree with nurse assessment in plan.  Thank you for speaking with them. 

## 2022-06-16 NOTE — Telephone Encounter (Signed)
Noted  

## 2022-06-16 NOTE — Telephone Encounter (Signed)
I spoke with pt and she is going to [facility] ED now. Sending note to Dutch Quint FNP and pt has TOC with Red Christians FNP on 09/02/2022 and dugal pool.

## 2022-06-16 NOTE — Telephone Encounter (Signed)
[facility] Day - Client TELEPHONE ADVICE RECORD [facility] Patient Name: Tiffany [name] Gender: Female DOB: September 21, 1970 Age: 51 Y 11 M 18 D Return Phone Number: [phone] (Primary) Address: [place]  [address] Client [facility] Day - Client Client Site [place] - Day Provider Waunita Schooner- MD Contact Type Call Who Is Calling Patient / Member / Family / Caregiver Call Type Triage / Clinical Relationship To Patient Self Return Phone Number [phone] (Primary) Chief Complaint VOMITING - Blood Reason for Call Symptomatic / Request for [name] states she woke up this morning with vomiting and vomit was a black color, black chunks. Urgent per charge nurse. Translation No Nurse Assessment Nurse: Radford Pax, RN, Eugene Garnet Date/Time [name] Time): 06/16/2022 9:25:19 AM Confirm and document reason for call. If symptomatic, describe symptoms. ---Vomited this morning and had black chunks in it. Does the patient have any new or worsening symptoms? ---Yes Will a triage be completed? ---Yes Related visit to physician within the last 2 weeks? ---No Does the PT have any chronic conditions? (i.e. diabetes, asthma, this includes High risk factors for pregnancy, etc.) ---Yes List chronic conditions. ---diabetes, gastroparesis, artthritis Is the patient pregnant or possibly pregnant? (Ask all females between the ages of 42-55) ---No Is this a behavioral health or substance abuse call? ---No Guidelines Guideline Title Affirmed Question Affirmed Notes Nurse Date/Time (Eastern Time) Vomiting Blood [1] Vomiting AND [2] contains black ("coffee ground") material Turner, RN, Rebekah 06/16/2022 9:27:05 AM Disp. Time [name] Time) Disposition Final User 06/16/2022 9:23:52 AM Send to Urgent Queue Dara Hoyer 06/16/2022 9:30:29 AM Go to ED Now Yes Radford Pax,  RN, Eugene Garnet PLEASE NOTE: All timestamps contained within this report are represented as [place] Standard Time. CONFIDENTIALTY NOTICE: This fax transmission is intended only for the addressee. It contains information that is legally privileged, confidential or otherwise protected from use or disclosure. If you are not the intended recipient, you are strictly prohibited from reviewing, disclosing, copying using or disseminating any of this information or taking any action in reliance on or regarding this information. If you have received this fax in error, please notify us immediately by telephone so that we can arrange for its return to [place]. Phone: [phone], Toll-Free: [phone], Fax: [phone] Page: 2 of 2 Call Id: [id number] Final Disposition 06/16/2022 9:30:29 AM Go to ED Now Yes Radford Pax, RN, Sharion Settler Disagree/Comply Comply Caller Understands Yes PreDisposition Did not know what to do Care Advice Given Per Guideline GO TO ED NOW: * You need to be seen in the Emergency Department. CARE ADVICE given per Vomiting Blood (Adult) guideline. Referrals [facility]

## 2022-07-01 DIAGNOSIS — E1043 Type 1 diabetes mellitus with diabetic autonomic (poly)neuropathy: Secondary | ICD-10-CM | POA: Diagnosis not present

## 2022-07-01 DIAGNOSIS — E1065 Type 1 diabetes mellitus with hyperglycemia: Secondary | ICD-10-CM | POA: Diagnosis not present

## 2022-07-01 DIAGNOSIS — Z794 Long term (current) use of insulin: Secondary | ICD-10-CM | POA: Diagnosis not present

## 2022-07-01 DIAGNOSIS — E109 Type 1 diabetes mellitus without complications: Secondary | ICD-10-CM | POA: Diagnosis not present

## 2022-07-16 ENCOUNTER — Ambulatory Visit (INDEPENDENT_AMBULATORY_CARE_PROVIDER_SITE_OTHER): Payer: BC Managed Care – PPO

## 2022-07-16 ENCOUNTER — Ambulatory Visit (INDEPENDENT_AMBULATORY_CARE_PROVIDER_SITE_OTHER): Payer: BC Managed Care – PPO | Admitting: Sports Medicine

## 2022-07-16 VITALS — BP 120/68 | Ht 66.0 in | Wt 149.0 lb

## 2022-07-16 DIAGNOSIS — M65342 Trigger finger, left ring finger: Secondary | ICD-10-CM

## 2022-07-16 DIAGNOSIS — M79642 Pain in left hand: Secondary | ICD-10-CM | POA: Diagnosis not present

## 2022-07-16 NOTE — Patient Instructions (Addendum)
Good to see you Avoid excess activity with your hand  3-4  week follow up

## 2022-07-16 NOTE — Progress Notes (Signed)
Tiffany [name] D.[place] Phone: [phone]   Assessment and Plan:     1. Left hand pain 2. Trigger finger, left ring finger  -Acute, uncomplicated, initial sports medicine visit - Consistent with trigger finger of fourth digit based on HPI and physical exam - X-ray obtained in clinic.  My interpretation: No acute fracture or dislocation.  Lucency seen in third and fourth proximal phalanx but does not completely cross phalanx.  Suspect this is more likely to be vascular as patient does not have a trauma related to these areas and no other cortical abnormality seen.  Will await formal radiology review - Patient elected for trigger finger CSI.  Tolerated well per note below.  Corticosteroid may temporarily increase blood glucose in patient with history of DM type I  Procedure: Flexor Tendon Sheath Injection (Trigger Finger) Side: Left 4th digit  Indication: Trigger Finger  After explaining the procedure, viable alternatives, risks, and answering any questions, consent was given verbally.  The site was cleaned with alcohol prep.  A steroid injection was performed under ultrasound guidance using 0.35m of 1% lidocaine without epinephrine and 20 mg of Kenalog 40 with sterile technique.  This was well tolerated and resulted in  relief.  Needle was removed and dressing placed and post injection instructions were given including  a discussion of likely return of pain today after the anesthetic wears off (with the possibility of worsened pain) until the steroid starts to work in 3-5 days.   Pt was advised to call or return to clinic if these symptoms worsen or fail to improve as anticipated.  If not at least 50% better in 6 weeks would consider repeat injection.   Pertinent previous records reviewed include none   Follow Up: 3 to 4 weeks for reevaluation.  Will also obtain right wrist x-ray at that time to evaluate patient  for potential carpal tunnel per patient   Subjective:   I, [name] am serving as a sEducation administratorfor Doctor BGlennon Mac Chief Complaint: left hand pain   HPI:   07/16/22 Patient is a 51year old female complaining of left hand pain. Patient states tht she has intermittent triggering of the 4th finger , been going on for a month , does get some numbness in the tip of her finger, does get pain in the palm of her hand , tylenol for the pain and that does not seem to help   Relevant Historical Information: DM type I, rheumatoid arthritis, Sjogren's  Additional pertinent review of systems negative.   Current Outpatient Medications:    albuterol (VENTOLIN HFA) 108 (90 Base) MCG/ACT inhaler, Inhale 1-2 puffs into the lungs every 6 (six) hours as needed for wheezing or shortness of breath., Disp: 1 each, Rfl: 0   aluminum chloride (DRYSOL) 20 % external solution, Apply topically every other day. For sweating. (Patient taking differently: Apply 1 application  topically every other day. For sweating.), Disp: 35 mL, Rfl: 1   APIDRA SOLOSTAR 100 UNIT/ML Solostar Pen, Inject into the skin., Disp: , Rfl:    atorvastatin (LIPITOR) 40 MG tablet, Take 1 tablet (40 mg total) by mouth every morning., Disp: 90 tablet, Rfl: 3   bisacodyl (DULCOLAX) 5 MG EC tablet, Take 5 mg by mouth daily as needed for mild constipation., Disp: , Rfl:    Continuous Blood Gluc Sensor (DEXCOM G6 SENSOR) MISC, Apply 1 sensor to the skin every 10 days for  continuous glucose monitoring., Disp: , Rfl:    Continuous Blood Gluc Transmit (DEXCOM G6 TRANSMITTER) MISC, Use as directed for continuous glucose monitoring. Reuse transmitter for 90 days then discard and replace., Disp: , Rfl:    fluticasone (FLOVENT HFA) 44 MCG/ACT inhaler, Inhale 2 puffs into the lungs 2 (two) times daily., Disp: 1 each, Rfl: 6   folic acid (FOLVITE) 1 MG tablet, Take 1 mg by mouth daily., Disp: , Rfl:    glucagon 1 MG injection, Inject 1 mg into the  skin once as needed (low blood sugar)., Disp: , Rfl:    insulin glargine (LANTUS SOLOSTAR) 100 UNIT/ML Solostar Pen, Inject 20 Units into the skin daily. If pump failure, take 20 units, Disp: , Rfl:    insulin glulisine (APIDRA) 100 UNIT/ML injection, Inject via insulin pump up to 60 units daily for T1DM, Disp: , Rfl:    linaclotide (LINZESS) 290 MCG CAPS capsule, Take 290 mcg by mouth daily. , Disp: , Rfl:    methotrexate (RHEUMATREX) 2.5 MG tablet, Take 15 mg by mouth once a week. Take 6 tablets (12.5 mg total) by mouth once a week, Disp: , Rfl:    Multiple Vitamin (MULTI-VITAMINS) TABS, Take 1 tablet by mouth daily., Disp: , Rfl:    ONETOUCH ULTRA test strip, TEST 4 TO 6 TIMES DAILY AS DIRECTED, Disp: , Rfl:    pantoprazole (PROTONIX) 40 MG tablet, Take 1 tablet (40 mg total) by mouth daily., Disp: 30 tablet, Rfl: 3   SUMAtriptan (IMITREX) 100 MG tablet, Take 100 mg by mouth as needed for migraine., Disp: , Rfl:    Urine Glucose-Ketones Test (KETO-DIASTIX) STRP, Use to check urine for ketones with high blood glucose, Disp: , Rfl:    Objective:     Vitals:   07/16/22 0935  BP: 120/68  Weight: 149 lb (67.6 kg)  Height: '5\' 6"'$  (1.676 m)      Body mass index is 24.05 kg/m.    Physical Exam:    General: Appears well, nad, nontoxic and pleasant Neuro:sensation intact, strength is 5/5 with df/pf/inv/ev, muscle tone wnl Skin:no susupicious lesions or rashes  Left hand/wrist:  No deformity or swelling appreciated. Fourth digit ROM  Ext 170, Flex 100 TTP fourth proximal phalanx, fourth middle phalanx, fourth MCP, fourth PIP    pain with resisted ext, flex     Electronically signed by:  Tiffany [name] D.[name] 9:56 AM 07/16/22

## 2022-07-22 ENCOUNTER — Ambulatory Visit (INDEPENDENT_AMBULATORY_CARE_PROVIDER_SITE_OTHER)
Admission: RE | Admit: 2022-07-22 | Discharge: 2022-07-22 | Disposition: A | Discharge status: Home / Self Care | Payer: BC Managed Care – PPO | Source: Ambulatory Visit | Attending: Family Medicine | Admitting: Family Medicine

## 2022-07-22 ENCOUNTER — Ambulatory Visit (INDEPENDENT_AMBULATORY_CARE_PROVIDER_SITE_OTHER): Payer: BC Managed Care – PPO | Admitting: Family Medicine

## 2022-07-22 ENCOUNTER — Encounter: Payer: Self-pay | Admitting: Family Medicine

## 2022-07-22 VITALS — BP 112/62 | HR 103 | Temp 98.0°F | Ht 66.0 in | Wt 153.1 lb

## 2022-07-22 DIAGNOSIS — R053 Chronic cough: Secondary | ICD-10-CM | POA: Diagnosis not present

## 2022-07-22 DIAGNOSIS — R059 Cough, unspecified: Secondary | ICD-10-CM | POA: Insufficient documentation

## 2022-07-22 DIAGNOSIS — R09A2 Foreign body sensation, throat: Secondary | ICD-10-CM

## 2022-07-22 NOTE — Patient Instructions (Addendum)
Get generic zyrtec over the counter (cetrizine)  10 mg every day at bedtime This is to slow down the mucous   Let's check a chest xray today  We will call with the result   Continue the generic protonix for acid reflux    Stop up front so we can send for ENT

## 2022-07-22 NOTE — Assessment & Plan Note (Signed)
Globus sensation with chronic spitting of mucous Pt denies nasal congestion , pnd or wheezing  [place] -we will try sending for that  She was put on protonix and flovent at that time  Also has h/o DM gastroparesis (but denies heartburn) Non smoker/no vaping or smoke exp Reassuring exam , some throat clearing  ? If gerd is cause  Cxr ordered for cough   Will likely need ENT ref here Pending old ENT notes Record here reviewed today as pt is new to me

## 2022-07-22 NOTE — Progress Notes (Signed)
Subjective:    Patient ID: Tiffany [name], female    DOB: 12/26/1970, 51 y.o.   MRN: [id number]  HPI 51 yo pt of Dr Einar Pheasant presents for mucous   Wt Readings from Last 3 Encounters:  07/22/22 153 lb 2 oz (69.5 kg)  07/16/22 149 lb (67.6 kg)  05/02/22 149 lb (67.6 kg)   24.72 kg/m  Mucous problem  Spits it out from her throat - thinks it is all from lungs  Chronic and getting worse Using flovent    Mucous build up  Coughs up mucous also - white in color / no yellow or green  No blood  Clears throat all day  Coughs a little when she is asleep   No sore throat  Ears feel ok   No wheezing Some tight feeling in chest   Protonix daily  Flonase nasal spray - was not helping so she stopped it  - makes nose more dry  Flovent has not helped   Went to allergy doctor- it "was not allergies"  ENT doctor- 6 months ago (in [place])  No diagnosis   Patient Active Problem List   Diagnosis Date Noted   Globus sensation 07/22/2022   Cough 07/22/2022   Adjustment disorder with depressed mood 05/03/2022   Intentional overdose ([place]) 05/02/2022   Stress and adjustment reaction 02/19/2022   Housing instability 02/19/2022   Memory difficulty 02/04/2022   Nocturnal leg cramps 02/04/2022   Dizziness 02/04/2022   Perspiration excessive 11/11/2021   Seasonal allergies 11/11/2021   At high risk for breast cancer 10/28/2021   Hair loss 09/23/2021   Chronic right-sided low back pain without sciatica 09/23/2021   Tinea 09/23/2021   Diabetic retinopathy associated with type 1 diabetes mellitus ([place]) 11/22/2020   External hemorrhoid 09/18/2020   Low iron 09/18/2020   History of shingles 08/22/2020   Post-nasal drip 08/22/2020   Rheumatoid arthritis ([place]) 06/05/2020   Oral herpes simplex infection 06/05/2020   Migraines 06/05/2020   Fibroids 06/05/2020   Recurrent UTI 06/05/2020   Primary osteoarthritis of right hip 09/28/2018   Adhesive capsulitis of left shoulder 03/24/2018    Sjogren's syndrome without extraglandular involvement ([place]) 02/24/2018   SS-A antibody positive 02/24/2018   Fibromyalgia 12/02/2017   G6PD deficiency 10/26/2017   Type 1 diabetes mellitus with complications ([place]) 25/95/6387   Primary generalized (osteo)arthritis 11/17/2016   Primary osteoarthritis, left hand 07/13/2015   Gastroparesis due to DM ([place]) 08/30/2013   Pelvic floor dysfunction 08/18/2013   Constipation 08/03/2013   Past Medical History:  Diagnosis Date   Arthritis    knees, lower back right side   Connective tissue disease ([place])    Depression    Diabetes mellitus    Diabetes mellitus type 1, uncontrolled, insulin dependent 04/03/2017   Fibromyalgia 12/02/2017   G6PD deficiency 10/26/2017   Gastroparesis    Headache(784.0)    Heart murmur    as a child - no problem as adult   History of kidney stones    no surgery   Hyperlipidemia    Lupus ([place])    Migraines 06/05/2020   Rheumatoid arthritis ([place]) 06/05/2020   Sjogren's syndrome without extraglandular involvement ([place]) 02/24/2018   Past Surgical History:  Procedure Laterality Date   CESAREAN SECTION  1994, 2006   x 2   COLONOSCOPY WITH PROPOFOL N/A 03/16/2013   Procedure: COLONOSCOPY WITH PROPOFOL;  Surgeon: Arta Silence, MD;  Location: [facility];  Service: Endoscopy;  Laterality: N/A;   HYSTEROSCOPY WITH D &  C N/A 07/11/2013   Procedure: DILATATION AND CURETTAGE /HYSTEROSCOPY;  Surgeon: Marvene Staff, MD;  Location: [facility];  Service: Gynecology;  Laterality: N/A;   left surgery shoulder     ROBOTIC ASSISTED LAPAROSCOPIC LYSIS OF ADHESION N/A 07/11/2013   Procedure: ROBOTIC ASSISTED LAPAROSCOPIC LYSIS OF ADHESIONS ; REPAIR OF UTERINE DEHISENCE;  Surgeon: Marvene Staff, MD;  Location: [facility];  Service: Gynecology;  Laterality: N/A;   TUBAL LIGATION     Social History   Tobacco Use   Smoking status: Never   Smokeless tobacco: Never  Vaping Use   Vaping Use: Never used  Substance Use Topics    Alcohol use: No   Drug use: No   Family History  Problem Relation Age of Onset   Diabetes Sister    Arthritis Sister    Hyperlipidemia Sister    Lung cancer Mother    Cancer Mother        ?Uterine cancer   Alzheimer's disease Maternal Grandmother    Allergies  Allergen Reactions   Morphine Itching and Hives   Duloxetine Hcl     Other reaction(s): constipation   Gabapentin     Leg Pain   Morphine And Related Hives and Itching   Tramadol Hives and Itching   Current Outpatient Medications on File Prior to Visit  Medication Sig Dispense Refill   albuterol (VENTOLIN HFA) 108 (90 Base) MCG/ACT inhaler Inhale 1-2 puffs into the lungs every 6 (six) hours as needed for wheezing or shortness of breath. 1 each 0   aluminum chloride (DRYSOL) 20 % external solution Apply topically every other day. For sweating. (Patient taking differently: Apply 1 application  topically every other day. For sweating.) 35 mL 1   APIDRA SOLOSTAR 100 UNIT/ML Solostar Pen Inject into the skin.     atorvastatin (LIPITOR) 40 MG tablet Take 1 tablet (40 mg total) by mouth every morning. 90 tablet 3   bisacodyl (DULCOLAX) 5 MG EC tablet Take 5 mg by mouth daily as needed for mild constipation.     Continuous Blood Gluc Sensor (DEXCOM G6 SENSOR) MISC Apply 1 sensor to the skin every 10 days for continuous glucose monitoring.     Continuous Blood Gluc Transmit (DEXCOM G6 TRANSMITTER) MISC Use as directed for continuous glucose monitoring. Reuse transmitter for 90 days then discard and replace.     fluticasone (FLOVENT HFA) 44 MCG/ACT inhaler Inhale 2 puffs into the lungs 2 (two) times daily. 1 each 6   folic acid (FOLVITE) 1 MG tablet Take 1 mg by mouth daily.     glucagon 1 MG injection Inject 1 mg into the skin once as needed (low blood sugar).     insulin glargine (LANTUS SOLOSTAR) 100 UNIT/ML Solostar Pen Inject 20 Units into the skin daily. If pump failure, take 20 units     insulin glulisine (APIDRA) 100 UNIT/ML  injection Inject via insulin pump up to 60 units daily for T1DM     linaclotide (LINZESS) 290 MCG CAPS capsule Take 290 mcg by mouth daily.      methotrexate (RHEUMATREX) 2.5 MG tablet Take 15 mg by mouth once a week. Take 6 tablets (12.5 mg total) by mouth once a week     Multiple Vitamin (MULTI-VITAMINS) TABS Take 1 tablet by mouth daily.     ONETOUCH ULTRA test strip TEST 4 TO 6 TIMES DAILY AS DIRECTED     pantoprazole (PROTONIX) 40 MG tablet Take 1 tablet (40 mg total) by mouth daily. [place]  tablet 3   SUMAtriptan (IMITREX) 100 MG tablet Take 100 mg by mouth as needed for migraine.     Urine Glucose-Ketones Test (KETO-DIASTIX) STRP Use to check urine for ketones with high blood glucose     No current facility-administered medications on file prior to visit.     Review of Systems  Constitutional:  Negative for activity change, appetite change, fatigue, fever and unexpected weight change.  HENT:  Negative for congestion, ear discharge, ear pain, facial swelling, postnasal drip, rhinorrhea, sinus pressure, sore throat, trouble swallowing and voice change.        Mucous in throat all the time  Clearing throat   Eyes:  Negative for pain, redness and visual disturbance.  Respiratory:  Positive for cough. Negative for shortness of breath and wheezing.   Cardiovascular:  Negative for chest pain and palpitations.  Gastrointestinal:  Negative for abdominal pain, blood in stool, constipation and diarrhea.  Endocrine: Negative for polydipsia and polyuria.  Genitourinary:  Negative for dysuria, frequency and urgency.  Musculoskeletal:  Negative for arthralgias, back pain and myalgias.  Skin:  Negative for pallor and rash.  Allergic/Immunologic: Negative for environmental allergies.  Neurological:  Negative for dizziness, syncope and headaches.  Hematological:  Negative for adenopathy. Does not bruise/bleed easily.  Psychiatric/Behavioral:  Negative for decreased concentration and dysphoric mood. The  patient is not nervous/anxious.        Objective:   Physical Exam Constitutional:      General: She is not in acute distress.    Appearance: Normal appearance. She is well-developed and normal weight.  HENT:     Head: Normocephalic and atraumatic.     Right Ear: Tympanic membrane, ear canal and external ear normal.     Left Ear: Tympanic membrane, ear canal and external ear normal.     Nose: No congestion or rhinorrhea.     Comments: Nares are boggy  No sinus tenderness     Mouth/Throat:     Mouth: Mucous membranes are moist.     Pharynx: Oropharynx is clear. No oropharyngeal exudate or posterior oropharyngeal erythema.     Comments: Scant clear pnd Eyes:     General:        Right eye: No discharge.        Left eye: No discharge.     Conjunctiva/sclera: Conjunctivae normal.     Pupils: Pupils are equal, round, and reactive to light.  Neck:     Thyroid: No thyromegaly.     Vascular: No carotid bruit or JVD.  Cardiovascular:     Rate and Rhythm: Normal rate and regular rhythm.     Heart sounds: Normal heart sounds.     No gallop.  Pulmonary:     Effort: Pulmonary effort is normal. No respiratory distress.     Breath sounds: Normal breath sounds. No stridor. No wheezing, rhonchi or rales.     Comments: Good air exchange Chest:     Chest wall: No tenderness.  Abdominal:     General: There is no distension or abdominal bruit.     Palpations: Abdomen is soft. There is no mass.     Tenderness: There is no abdominal tenderness.  Musculoskeletal:     Cervical back: Normal range of motion and neck supple.     Right lower leg: No edema.     Left lower leg: No edema.  Lymphadenopathy:     Cervical: No cervical adenopathy.  Skin:    General: Skin is warm and dry.  Coloration: Skin is not pale.     Findings: No rash.  Neurological:     Mental Status: She is alert.     Coordination: Coordination normal.     Deep Tendon Reflexes: Reflexes are normal and symmetric.  Reflexes normal.  Psychiatric:        Mood and Affect: Mood normal.           Assessment & Plan:   Problem List Items Addressed This Visit       Other   Cough - Primary    With chronic globus sensation and DM gastroparesis  Cxr ordered       Relevant Orders   DG Chest 2 View   Globus sensation    Globus sensation with chronic spitting of mucous Pt denies nasal congestion , pnd or wheezing  Per [place] in [place] -we will try sending for that  She was put on protonix and flovent at that time  Also has h/o DM gastroparesis (but denies heartburn) Non smoker/no vaping or smoke exp Reassuring exam , some throat clearing  ? If gerd is cause  Cxr ordered for cough   Will likely need ENT ref here Pending old ENT notes Record here reviewed today as pt is new to me       Relevant Orders   DG Chest 2 View

## 2022-07-22 NOTE — Assessment & Plan Note (Signed)
With chronic globus sensation and DM gastroparesis  Cxr ordered

## 2022-07-24 ENCOUNTER — Telehealth: Payer: Self-pay | Admitting: Family Medicine

## 2022-07-24 NOTE — Telephone Encounter (Signed)
Sent mychart letting pt know Dr. Tower's comments. 

## 2022-07-24 NOTE — Telephone Encounter (Signed)
I assume her disability is due to RA-this needs to come from her rheumatologist    She also needs to get est with new pcp

## 2022-07-24 NOTE — Telephone Encounter (Signed)
Pt was pt of Dr. Einar Pheasant. Saw Dr. Glori Bickers Tuesday. Pt requesting a note for jury duty. States she is on disability, can barely walk and cannot go to jury duty.  Pt stated she mentioned this during visit on Tuesday and wants to make sure Dr. Glori Bickers knows she is on disability.   I did not commit to this being done, only that she would get a reply via MyChart.

## 2022-07-27 ENCOUNTER — Other Ambulatory Visit: Payer: Self-pay | Admitting: Internal Medicine

## 2022-07-29 ENCOUNTER — Other Ambulatory Visit: Payer: Self-pay

## 2022-07-29 DIAGNOSIS — D509 Iron deficiency anemia, unspecified: Secondary | ICD-10-CM | POA: Diagnosis not present

## 2022-07-29 DIAGNOSIS — M0609 Rheumatoid arthritis without rheumatoid factor, multiple sites: Secondary | ICD-10-CM | POA: Diagnosis not present

## 2022-07-29 DIAGNOSIS — Z79899 Other long term (current) drug therapy: Secondary | ICD-10-CM | POA: Diagnosis not present

## 2022-07-29 DIAGNOSIS — M2550 Pain in unspecified joint: Secondary | ICD-10-CM | POA: Diagnosis not present

## 2022-07-29 MED ORDER — PANTOPRAZOLE SODIUM 40 MG PO TBEC: 40.0000 mg | DELAYED_RELEASE_TABLET | Freq: Every day | ORAL | 1 refills | Status: DC

## 2022-07-29 NOTE — Telephone Encounter (Signed)
Can we get this patient a follow up?  Thanks!

## 2022-07-29 NOTE — Telephone Encounter (Signed)
Sent in #30 with one refill patient needs office visit for further

## 2022-07-30 ENCOUNTER — Ambulatory Visit: Payer: BC Managed Care – PPO | Admitting: Urology

## 2022-08-01 ENCOUNTER — Encounter: Payer: Self-pay | Admitting: Urology

## 2022-08-05 NOTE — Progress Notes (Deleted)
Benito Mccreedy D.[facility] Phone: [phone]   Assessment and Plan:     There are no diagnoses linked to this encounter.  ***   Pertinent previous records reviewed include ***   Follow Up: ***     Subjective:   I, Tiffany [name], am serving as a Education administrator for Doctor Glennon Mac   Chief Complaint: left hand pain    HPI:    07/16/22 Patient is a 52 year old female complaining of left hand pain. Patient states tht she has intermittent triggering of the 4th finger , been going on for a month , does get some numbness in the tip of her finger, does get pain in the palm of her hand , tylenol for the pain and that does not seem to help   08/06/2022 Patient states    Relevant Historical Information: DM type I, rheumatoid arthritis, Sjogren's  Additional pertinent review of systems negative.   Current Outpatient Medications:    albuterol (VENTOLIN HFA) 108 (90 Base) MCG/ACT inhaler, Inhale 1-2 puffs into the lungs every 6 (six) hours as needed for wheezing or shortness of breath., Disp: 1 each, Rfl: 0   aluminum chloride (DRYSOL) 20 % external solution, Apply topically every other day. For sweating. (Patient taking differently: Apply 1 application  topically every other day. For sweating.), Disp: 35 mL, Rfl: 1   APIDRA SOLOSTAR 100 UNIT/ML Solostar Pen, Inject into the skin., Disp: , Rfl:    atorvastatin (LIPITOR) 40 MG tablet, Take 1 tablet (40 mg total) by mouth every morning., Disp: 90 tablet, Rfl: 3   bisacodyl (DULCOLAX) 5 MG EC tablet, Take 5 mg by mouth daily as needed for mild constipation., Disp: , Rfl:    Continuous Blood Gluc Sensor (DEXCOM G6 SENSOR) MISC, Apply 1 sensor to the skin every 10 days for continuous glucose monitoring., Disp: , Rfl:    Continuous Blood Gluc Transmit (DEXCOM G6 TRANSMITTER) MISC, Use as directed for continuous glucose monitoring. Reuse transmitter for 90 days then  discard and replace., Disp: , Rfl:    fluticasone (FLOVENT HFA) 44 MCG/ACT inhaler, Inhale 2 puffs into the lungs 2 (two) times daily., Disp: 1 each, Rfl: 6   folic acid (FOLVITE) 1 MG tablet, Take 1 mg by mouth daily., Disp: , Rfl:    glucagon 1 MG injection, Inject 1 mg into the skin once as needed (low blood sugar)., Disp: , Rfl:    insulin glargine (LANTUS SOLOSTAR) 100 UNIT/ML Solostar Pen, Inject 20 Units into the skin daily. If pump failure, take 20 units, Disp: , Rfl:    insulin glulisine (APIDRA) 100 UNIT/ML injection, Inject via insulin pump up to 60 units daily for T1DM, Disp: , Rfl:    linaclotide (LINZESS) 290 MCG CAPS capsule, Take 290 mcg by mouth daily. , Disp: , Rfl:    methotrexate (RHEUMATREX) 2.5 MG tablet, Take 15 mg by mouth once a week. Take 6 tablets (12.5 mg total) by mouth once a week, Disp: , Rfl:    Multiple Vitamin (MULTI-VITAMINS) TABS, Take 1 tablet by mouth daily., Disp: , Rfl:    ONETOUCH ULTRA test strip, TEST 4 TO 6 TIMES DAILY AS DIRECTED, Disp: , Rfl:    pantoprazole (PROTONIX) 40 MG tablet, TAKE 1 TABLET BY MOUTH ONCE DAILY, Disp: 30 tablet, Rfl: 1   pantoprazole (PROTONIX) 40 MG tablet, Take 1 tablet (40 mg total) by mouth daily., Disp: 30 tablet, Rfl: 1  SUMAtriptan (IMITREX) 100 MG tablet, Take 100 mg by mouth as needed for migraine., Disp: , Rfl:    Urine Glucose-Ketones Test (KETO-DIASTIX) STRP, Use to check urine for ketones with high blood glucose, Disp: , Rfl:    Objective:     There were no vitals filed for this visit.    There is no height or weight on file to calculate BMI.    Physical Exam:    ***   Electronically signed by:  Benito Mccreedy D.[name] 12:31 PM 08/05/22

## 2022-08-06 ENCOUNTER — Ambulatory Visit: Payer: BC Managed Care – PPO | Admitting: Sports Medicine

## 2022-08-15 NOTE — Progress Notes (Deleted)
[facility] Note  08/26/2022     CHIEF COMPLAINT Patient presents for No chief complaint on file.   HISTORY OF PRESENT ILLNESS: [place] L Wixted is a 52 y.o. female who presents to the clinic today for:     Referring physician: Eugenia Pancoast, FNP [place],  [place] [id number]  HISTORICAL INFORMATION:   Selected notes from the MEDICAL RECORD NUMBER Referred by Dr. Truman Hayward:  Ocular Hx- PMH-    CURRENT MEDICATIONS: No current outpatient medications on file. (Ophthalmic Drugs)   No current facility-administered medications for this visit. (Ophthalmic Drugs)   Current Outpatient Medications (Other)  Medication Sig   albuterol (VENTOLIN HFA) 108 (90 Base) MCG/ACT inhaler Inhale 1-2 puffs into the lungs every 6 (six) hours as needed for wheezing or shortness of breath.   aluminum chloride (DRYSOL) 20 % external solution Apply topically every other day. For sweating. (Patient taking differently: Apply 1 application  topically every other day. For sweating.)   APIDRA SOLOSTAR 100 UNIT/ML Solostar Pen Inject into the skin.   atorvastatin (LIPITOR) 40 MG tablet Take 1 tablet (40 mg total) by mouth every morning.   bisacodyl (DULCOLAX) 5 MG EC tablet Take 5 mg by mouth daily as needed for mild constipation.   Continuous Blood Gluc Sensor (DEXCOM G6 SENSOR) MISC Apply 1 sensor to the skin every 10 days for continuous glucose monitoring.   Continuous Blood Gluc Transmit (DEXCOM G6 TRANSMITTER) MISC Use as directed for continuous glucose monitoring. Reuse transmitter for 90 days then discard and replace.   fluticasone (FLOVENT HFA) 44 MCG/ACT inhaler Inhale 2 puffs into the lungs 2 (two) times daily.   folic acid (FOLVITE) 1 MG tablet Take 1 mg by mouth daily.   glucagon 1 MG injection Inject 1 mg into the skin once as needed (low blood sugar).   insulin glargine (LANTUS SOLOSTAR) 100 UNIT/ML Solostar Pen Inject 20 Units into the skin daily. If  pump failure, take 20 units   insulin glulisine (APIDRA) 100 UNIT/ML injection Inject via insulin pump up to 60 units daily for T1DM   linaclotide (LINZESS) 290 MCG CAPS capsule Take 290 mcg by mouth daily.    methotrexate (RHEUMATREX) 2.5 MG tablet Take 15 mg by mouth once a week. Take 6 tablets (12.5 mg total) by mouth once a week   Multiple Vitamin (MULTI-VITAMINS) TABS Take 1 tablet by mouth daily.   ONETOUCH ULTRA test strip TEST 4 TO 6 TIMES DAILY AS DIRECTED   pantoprazole (PROTONIX) 40 MG tablet TAKE 1 TABLET BY MOUTH ONCE DAILY   pantoprazole (PROTONIX) 40 MG tablet Take 1 tablet (40 mg total) by mouth daily.   SUMAtriptan (IMITREX) 100 MG tablet Take 100 mg by mouth as needed for migraine.   Urine Glucose-Ketones Test (KETO-DIASTIX) STRP Use to check urine for ketones with high blood glucose   No current facility-administered medications for this visit. (Other)      REVIEW OF SYSTEMS:    ALLERGIES Allergies  Allergen Reactions   Morphine Itching and Hives   Duloxetine Hcl     Other reaction(s): constipation   Gabapentin     Leg Pain   Morphine And Related Hives and Itching   Tramadol Hives and Itching    PAST MEDICAL HISTORY Past Medical History:  Diagnosis Date   Arthritis    knees, lower back right side   Connective tissue disease ([place])    Depression    Diabetes mellitus  Diabetes mellitus type 1, uncontrolled, insulin dependent 04/03/2017   Fibromyalgia 12/02/2017   G6PD deficiency 10/26/2017   Gastroparesis    Headache(784.0)    Heart murmur    as a child - no problem as adult   History of kidney stones    no surgery   Hyperlipidemia    Lupus ([place])    Migraines 06/05/2020   Rheumatoid arthritis ([place]) 06/05/2020   Sjogren's syndrome without extraglandular involvement ([place]) 02/24/2018   Past Surgical History:  Procedure Laterality Date   CESAREAN SECTION  1994, 2006   x 2   COLONOSCOPY WITH PROPOFOL N/A 03/16/2013   Procedure: COLONOSCOPY WITH PROPOFOL;   Surgeon: Arta Silence, MD;  Location: [facility];  Service: Endoscopy;  Laterality: N/A;   HYSTEROSCOPY WITH D & C N/A 07/11/2013   Procedure: DILATATION AND CURETTAGE /HYSTEROSCOPY;  Surgeon: Marvene Staff, MD;  Location: [facility];  Service: Gynecology;  Laterality: N/A;   left surgery shoulder     ROBOTIC ASSISTED LAPAROSCOPIC LYSIS OF ADHESION N/A 07/11/2013   Procedure: ROBOTIC ASSISTED LAPAROSCOPIC LYSIS OF ADHESIONS ; REPAIR OF UTERINE DEHISENCE;  Surgeon: Marvene Staff, MD;  Location: [facility];  Service: Gynecology;  Laterality: N/A;   TUBAL LIGATION      FAMILY HISTORY Family History  Problem Relation Age of Onset   Diabetes Sister    Arthritis Sister    Hyperlipidemia Sister    Lung cancer Mother    Cancer Mother        ?Uterine cancer   Alzheimer's disease Maternal Grandmother     SOCIAL HISTORY Social History   Tobacco Use   Smoking status: Never   Smokeless tobacco: Never  Vaping Use   Vaping Use: Never used  Substance Use Topics   Alcohol use: No   Drug use: No         OPHTHALMIC EXAM:  Not recorded     IMAGING AND PROCEDURES  Imaging and Procedures for 08/26/2022           ASSESSMENT/PLAN:  No diagnosis found.  1.  2.  3.  Ophthalmic Meds Ordered this visit:  No orders of the defined types were placed in this encounter.      No follow-ups on file.  There are no Patient Instructions on file for this visit.   Explained the diagnoses, plan, and follow up with the patient and they expressed understanding.  Patient expressed understanding of the importance of proper follow up care.   This document serves as a record of services personally performed by Gardiner Sleeper, MD, PhD. It was created on their behalf by Renaldo Reel, [place] an ophthalmic technician. The creation of this record is the provider's dictation and/or activities during the visit.    Electronically signed by:  Renaldo Reel, COT  01.19.24 7:42  AM   Gardiner Sleeper, M.D., Ph.D. Diseases & Surgery of the Retina and [place] '@TODAY'$ @     Abbreviations: M myopia (nearsighted); A astigmatism; H hyperopia (farsighted); P presbyopia; Mrx spectacle prescription;  CTL contact lenses; OD right eye; OS left eye; OU both eyes  XT exotropia; ET esotropia; PEK punctate epithelial keratitis; PEE punctate epithelial erosions; DES dry eye syndrome; MGD meibomian gland dysfunction; ATs artificial tears; PFAT's preservative free artificial tears; [place] nuclear sclerotic cataract; PSC posterior subcapsular cataract; ERM epi-retinal membrane; PVD posterior vitreous detachment; RD retinal detachment; DM diabetes mellitus; DR diabetic retinopathy; NPDR non-proliferative diabetic retinopathy; PDR proliferative diabetic retinopathy; CSME clinically  significant macular edema; DME diabetic macular edema; dbh dot blot hemorrhages; CWS cotton wool spot; POAG primary open angle glaucoma; C/D cup-to-disc ratio; HVF humphrey visual field; GVF goldmann visual field; OCT optical coherence tomography; IOP intraocular pressure; BRVO Branch retinal vein occlusion; CRVO central retinal vein occlusion; CRAO central retinal artery occlusion; BRAO branch retinal artery occlusion; RT retinal tear; SB scleral buckle; PPV pars plana vitrectomy; VH Vitreous hemorrhage; PRP panretinal laser photocoagulation; IVK intravitreal kenalog; VMT vitreomacular traction; MH Macular hole;  NVD neovascularization of the disc; NVE neovascularization elsewhere; AREDS age related eye disease study; ARMD age related macular degeneration; POAG primary open angle glaucoma; EBMD epithelial/anterior basement membrane dystrophy; ACIOL anterior chamber intraocular lens; IOL intraocular lens; PCIOL posterior chamber intraocular lens; Phaco/IOL phacoemulsification with intraocular lens placement; [place] photorefractive keratectomy; LASIK laser assisted in situ keratomileusis; HTN  hypertension; DM diabetes mellitus; COPD chronic obstructive pulmonary disease

## 2022-08-20 DIAGNOSIS — D649 Anemia, unspecified: Secondary | ICD-10-CM | POA: Diagnosis not present

## 2022-08-20 DIAGNOSIS — K5909 Other constipation: Secondary | ICD-10-CM | POA: Diagnosis not present

## 2022-08-25 ENCOUNTER — Telehealth: Payer: Self-pay

## 2022-08-25 ENCOUNTER — Encounter: Payer: BC Managed Care – PPO | Admitting: Obstetrics and Gynecology

## 2022-08-25 NOTE — Telephone Encounter (Signed)
Left message for pt to call back regarding appt this afternoon

## 2022-08-26 ENCOUNTER — Encounter (INDEPENDENT_AMBULATORY_CARE_PROVIDER_SITE_OTHER): Payer: Self-pay

## 2022-08-26 ENCOUNTER — Encounter (INDEPENDENT_AMBULATORY_CARE_PROVIDER_SITE_OTHER): Payer: BC Managed Care – PPO | Admitting: Ophthalmology

## 2022-08-26 ENCOUNTER — Encounter (INDEPENDENT_AMBULATORY_CARE_PROVIDER_SITE_OTHER): Payer: Self-pay | Admitting: Ophthalmology

## 2022-08-27 NOTE — Progress Notes (Signed)
This encounter was created in error - please disregard.

## 2022-09-01 DIAGNOSIS — Z79631 Long term (current) use of antimetabolite agent: Secondary | ICD-10-CM | POA: Diagnosis not present

## 2022-09-01 DIAGNOSIS — M0609 Rheumatoid arthritis without rheumatoid factor, multiple sites: Secondary | ICD-10-CM | POA: Diagnosis not present

## 2022-09-01 DIAGNOSIS — M2550 Pain in unspecified joint: Secondary | ICD-10-CM | POA: Diagnosis not present

## 2022-09-02 ENCOUNTER — Encounter: Payer: BC Managed Care – PPO | Admitting: Family

## 2022-09-02 NOTE — Progress Notes (Shared)
[facility] Note  09/08/2022     CHIEF COMPLAINT Patient presents for No chief complaint on file.   HISTORY OF PRESENT ILLNESS: Tiffany [name] is a 52 y.o. female who presents to the clinic today for:     Referring physician: Eugenia Pancoast, FNP [place],  [place] [id number]  HISTORICAL INFORMATION:   Selected notes from the MEDICAL RECORD NUMBER Referred by Dr. Truman Hayward:  Ocular Hx- PMH-    CURRENT MEDICATIONS: No current outpatient medications on file. (Ophthalmic Drugs)   No current facility-administered medications for this visit. (Ophthalmic Drugs)   Current Outpatient Medications (Other)  Medication Sig   albuterol (VENTOLIN HFA) 108 (90 Base) MCG/ACT inhaler Inhale 1-2 puffs into the lungs every 6 (six) hours as needed for wheezing or shortness of breath.   aluminum chloride (DRYSOL) 20 % external solution Apply topically every other day. For sweating. (Patient taking differently: Apply 1 application  topically every other day. For sweating.)   APIDRA SOLOSTAR 100 UNIT/ML Solostar Pen Inject into the skin.   atorvastatin (LIPITOR) 40 MG tablet Take 1 tablet (40 mg total) by mouth every morning.   bisacodyl (DULCOLAX) 5 MG EC tablet Take 5 mg by mouth daily as needed for mild constipation.   Continuous Blood Gluc Sensor (DEXCOM G6 SENSOR) MISC Apply 1 sensor to the skin every 10 days for continuous glucose monitoring.   Continuous Blood Gluc Transmit (DEXCOM G6 TRANSMITTER) MISC Use as directed for continuous glucose monitoring. Reuse transmitter for 90 days then discard and replace.   fluticasone (FLOVENT HFA) 44 MCG/ACT inhaler Inhale 2 puffs into the lungs 2 (two) times daily.   folic acid (FOLVITE) 1 MG tablet Take 1 mg by mouth daily.   glucagon 1 MG injection Inject 1 mg into the skin once as needed (low blood sugar).   insulin glargine (LANTUS SOLOSTAR) 100 UNIT/ML Solostar Pen Inject 20 Units into the skin daily. If  pump failure, take 20 units   insulin glulisine (APIDRA) 100 UNIT/ML injection Inject via insulin pump up to 60 units daily for T1DM   linaclotide (LINZESS) 290 MCG CAPS capsule Take 290 mcg by mouth daily.    methotrexate (RHEUMATREX) 2.5 MG tablet Take 15 mg by mouth once a week. Take 6 tablets (12.5 mg total) by mouth once a week   Multiple Vitamin (MULTI-VITAMINS) TABS Take 1 tablet by mouth daily.   ONETOUCH ULTRA test strip TEST 4 TO 6 TIMES DAILY AS DIRECTED   pantoprazole (PROTONIX) 40 MG tablet TAKE 1 TABLET BY MOUTH ONCE DAILY   pantoprazole (PROTONIX) 40 MG tablet Take 1 tablet (40 mg total) by mouth daily.   SUMAtriptan (IMITREX) 100 MG tablet Take 100 mg by mouth as needed for migraine. (Patient not taking: Reported on 08/26/2022)   Urine Glucose-Ketones Test (KETO-DIASTIX) STRP Use to check urine for ketones with high blood glucose   No current facility-administered medications for this visit. (Other)      REVIEW OF SYSTEMS:    ALLERGIES Allergies  Allergen Reactions   Morphine Itching and Hives   Duloxetine Hcl     Other reaction(s): constipation   Gabapentin     Leg Pain   Morphine And Related Hives and Itching   Tramadol Hives and Itching    PAST MEDICAL HISTORY Past Medical History:  Diagnosis Date   Arthritis    knees, lower back right side   Connective tissue disease ([place])    Depression  Diabetes mellitus    Diabetes mellitus type 1, uncontrolled, insulin dependent 04/03/2017   Fibromyalgia 12/02/2017   G6PD deficiency 10/26/2017   Gastroparesis    Headache(784.0)    Heart murmur    as a child - no problem as adult   History of kidney stones    no surgery   Hyperlipidemia    Lupus ([place])    Migraines 06/05/2020   Rheumatoid arthritis ([place]) 06/05/2020   Sjogren's syndrome without extraglandular involvement ([place]) 02/24/2018   Past Surgical History:  Procedure Laterality Date   CESAREAN SECTION  1994, 2006   x 2   COLONOSCOPY WITH PROPOFOL N/A  03/16/2013   Procedure: COLONOSCOPY WITH PROPOFOL;  Surgeon: Arta Silence, MD;  Location: [facility];  Service: Endoscopy;  Laterality: N/A;   HYSTEROSCOPY WITH D & C N/A 07/11/2013   Procedure: DILATATION AND CURETTAGE /HYSTEROSCOPY;  Surgeon: Marvene Staff, MD;  Location: [facility];  Service: Gynecology;  Laterality: N/A;   left surgery shoulder     ROBOTIC ASSISTED LAPAROSCOPIC LYSIS OF ADHESION N/A 07/11/2013   Procedure: ROBOTIC ASSISTED LAPAROSCOPIC LYSIS OF ADHESIONS ; REPAIR OF UTERINE DEHISENCE;  Surgeon: Marvene Staff, MD;  Location: [facility];  Service: Gynecology;  Laterality: N/A;   TUBAL LIGATION      FAMILY HISTORY Family History  Problem Relation Age of Onset   Diabetes Sister    Arthritis Sister    Hyperlipidemia Sister    Lung cancer Mother    Cancer Mother        ?Uterine cancer   Alzheimer's disease Maternal Grandmother     SOCIAL HISTORY Social History   Tobacco Use   Smoking status: Never   Smokeless tobacco: Never  Vaping Use   Vaping Use: Never used  Substance Use Topics   Alcohol use: No   Drug use: No         OPHTHALMIC EXAM:  Not recorded     IMAGING AND PROCEDURES  Imaging and Procedures for 09/08/2022           ASSESSMENT/PLAN:  No diagnosis found.  1.  2.  3.  Ophthalmic Meds Ordered this visit:  No orders of the defined types were placed in this encounter.      No follow-ups on file.  There are no Patient Instructions on file for this visit.   Explained the diagnoses, plan, and follow up with the patient and they expressed understanding.  Patient expressed understanding of the importance of proper follow up care.   This document serves as a record of services personally performed by Gardiner Sleeper, MD, PhD. It was created on their behalf by Orvan Falconer, an ophthalmic technician. The creation of this record is the provider's dictation and/or activities during the visit.    Electronically signed  by: Orvan Falconer, OA, 09/02/22  10:06 AM   Gardiner Sleeper, M.D., Ph.D. Diseases & Surgery of the Retina and [place] '@TODAY'$ @     Abbreviations: M myopia (nearsighted); A astigmatism; H hyperopia (farsighted); P presbyopia; Mrx spectacle prescription;  CTL contact lenses; OD right eye; OS left eye; OU both eyes  XT exotropia; ET esotropia; PEK punctate epithelial keratitis; PEE punctate epithelial erosions; DES dry eye syndrome; MGD meibomian gland dysfunction; ATs artificial tears; PFAT's preservative free artificial tears; [place] nuclear sclerotic cataract; PSC posterior subcapsular cataract; ERM epi-retinal membrane; PVD posterior vitreous detachment; RD retinal detachment; DM diabetes mellitus; DR diabetic retinopathy; NPDR non-proliferative diabetic retinopathy; PDR proliferative diabetic  retinopathy; CSME clinically significant macular edema; DME diabetic macular edema; dbh dot blot hemorrhages; CWS cotton wool spot; POAG primary open angle glaucoma; C/D cup-to-disc ratio; HVF humphrey visual field; GVF goldmann visual field; OCT optical coherence tomography; IOP intraocular pressure; BRVO Branch retinal vein occlusion; CRVO central retinal vein occlusion; CRAO central retinal artery occlusion; BRAO branch retinal artery occlusion; RT retinal tear; SB scleral buckle; PPV pars plana vitrectomy; VH Vitreous hemorrhage; PRP panretinal laser photocoagulation; IVK intravitreal kenalog; VMT vitreomacular traction; MH Macular hole;  NVD neovascularization of the disc; NVE neovascularization elsewhere; AREDS age related eye disease study; ARMD age related macular degeneration; POAG primary open angle glaucoma; EBMD epithelial/anterior basement membrane dystrophy; ACIOL anterior chamber intraocular lens; IOL intraocular lens; PCIOL posterior chamber intraocular lens; Phaco/IOL phacoemulsification with intraocular lens placement; [place] photorefractive keratectomy; LASIK  laser assisted in situ keratomileusis; HTN hypertension; DM diabetes mellitus; COPD chronic obstructive pulmonary disease

## 2022-09-03 ENCOUNTER — Ambulatory Visit (INDEPENDENT_AMBULATORY_CARE_PROVIDER_SITE_OTHER): Payer: BC Managed Care – PPO | Admitting: Physician Assistant

## 2022-09-03 VITALS — BP 119/70 | HR 102 | Ht 67.0 in | Wt 151.5 lb

## 2022-09-03 DIAGNOSIS — N390 Urinary tract infection, site not specified: Secondary | ICD-10-CM

## 2022-09-03 DIAGNOSIS — R3121 Asymptomatic microscopic hematuria: Secondary | ICD-10-CM | POA: Diagnosis not present

## 2022-09-03 LAB — URINALYSIS, COMPLETE

## 2022-09-03 LAB — MICROSCOPIC EXAMINATION: RBC, Urine: >30 /hpf — AB (ref 0–2)

## 2022-09-03 MED ORDER — NITROFURANTOIN MONOHYD MACRO 100 MG PO CAPS: 100.0000 mg | ORAL_CAPSULE | ORAL | 2 refills | Status: DC | PRN

## 2022-09-04 DIAGNOSIS — D649 Anemia, unspecified: Secondary | ICD-10-CM | POA: Diagnosis not present

## 2022-09-04 NOTE — Progress Notes (Signed)
09/03/2022 11:55 AM   Tiffany [name] 01/25/1971 [id number]  CC: Chief Complaint  Patient presents with   Hematuria    Follow up   HPI: Tiffany [name] is a 52 y.o. female with PMH DM1, chronic constipation, recurrent UTIs on postcoital prophylaxis per Dr. Diamantina Providence, and microscopic hematuria who presents today for medication refill.   She was seen in clinic by Dr. Diamantina Providence on 12/05/2020 in consultation for her recurrent UTIs.  She was found to have microscopic hematuria at that time and was scheduled for follow-up with me for repeat UA on postcoital Macrobid 1 month later with plans to consider hematuria workup if she had persistent microscopic hematuria.  She no showed this visit has been lost to follow-up since.  Today she reports she continues to take postcoital Macrobid, now with increasing frequency since she recently got back together with her husband.  Despite this, she reports UTIs approximately once monthly.  She does think her UTI frequency has improved with the postcoital prophylaxis.  She states her UTIs have been treated at urgent care and by her PCPn.  No outside medical records available for review.  She reports ongoing fecal incontinence, for which she is seeing gastroenterology.  She denies urinary incontinence.  She states she is menstruating today, so she anticipates there will be blood in her urine sample.  She reports a recent history of abnormal uterine bleeding and is scheduled to follow-up with OB/GYN later this month.  She had to leave clinic today to pick up her child, her visit was abbreviated.  In-office UA today with red color, unable to interpret due to pigment interference; urine microscopy with >30 RBCs/HPF.  PMH: Past Medical History:  Diagnosis Date   Arthritis    knees, lower back right side   Connective tissue disease ([place])    Depression    Diabetes mellitus    Diabetes mellitus type 1, uncontrolled, insulin dependent 04/03/2017   Fibromyalgia  12/02/2017   G6PD deficiency 10/26/2017   Gastroparesis    Headache(784.0)    Heart murmur    as a child - no problem as adult   History of kidney stones    no surgery   Hyperlipidemia    Lupus ([place])    Migraines 06/05/2020   Rheumatoid arthritis ([place]) 06/05/2020   Sjogren's syndrome without extraglandular involvement ([place]) 02/24/2018    Surgical History: Past Surgical History:  Procedure Laterality Date   CESAREAN SECTION  1994, 2006   x 2   COLONOSCOPY WITH PROPOFOL N/A 03/16/2013   Procedure: COLONOSCOPY WITH PROPOFOL;  Surgeon: Arta Silence, MD;  Location: [facility];  Service: Endoscopy;  Laterality: N/A;   HYSTEROSCOPY WITH D & C N/A 07/11/2013   Procedure: DILATATION AND CURETTAGE /HYSTEROSCOPY;  Surgeon: Marvene Staff, MD;  Location: [facility];  Service: Gynecology;  Laterality: N/A;   left surgery shoulder     ROBOTIC ASSISTED LAPAROSCOPIC LYSIS OF ADHESION N/A 07/11/2013   Procedure: ROBOTIC ASSISTED LAPAROSCOPIC LYSIS OF ADHESIONS ; REPAIR OF UTERINE DEHISENCE;  Surgeon: Marvene Staff, MD;  Location: [facility];  Service: Gynecology;  Laterality: N/A;   TUBAL LIGATION      Home Medications:  Allergies as of 09/03/2022       Reactions   Morphine Itching, Hives   Duloxetine Hcl    Other reaction(s): constipation   Gabapentin    Leg Pain   Morphine And Related Hives, Itching   Tramadol Hives, Itching        Medication  List        Accurate as of September 03, 2022 11:59 PM. If you have any questions, ask your nurse or doctor.          albuterol 108 (90 Base) MCG/ACT inhaler Commonly known as: VENTOLIN HFA Inhale 1-2 puffs into the lungs every 6 (six) hours as needed for wheezing or shortness of breath.   aluminum chloride 20 % external solution Commonly known as: DRYSOL Apply topically every other day. For sweating. What changed: how much to take   Apidra 100 UNIT/ML injection Generic drug: insulin glulisine Inject via insulin pump up to 60  units daily for T1DM   Apidra SoloStar 100 UNIT/ML Solostar Pen Generic drug: insulin glulisine Inject into the skin.   atorvastatin 40 MG tablet Commonly known as: LIPITOR Take 1 tablet (40 mg total) by mouth every morning.   bisacodyl 5 MG EC tablet Commonly known as: DULCOLAX Take 5 mg by mouth daily as needed for mild constipation.   Dexcom G6 Sensor Misc Apply 1 sensor to the skin every 10 days for continuous glucose monitoring.   Dexcom G6 Transmitter Misc Use as directed for continuous glucose monitoring. Reuse transmitter for 90 days then discard and replace.   fluticasone 44 MCG/ACT inhaler Commonly known as: Flovent HFA Inhale 2 puffs into the lungs 2 (two) times daily.   folic acid 1 MG tablet Commonly known as: FOLVITE Take 1 mg by mouth daily.   glucagon 1 MG injection Inject 1 mg into the skin once as needed (low blood sugar).   Keto-Diastix Strp Use to check urine for ketones with high blood glucose   Lantus SoloStar 100 UNIT/ML Solostar Pen Generic drug: insulin glargine Inject 20 Units into the skin daily. If pump failure, take 20 units   linaclotide 290 MCG Caps capsule Commonly known as: LINZESS Take 290 mcg by mouth daily.   methotrexate 2.5 MG tablet Commonly known as: RHEUMATREX Take 15 mg by mouth once a week. Take 6 tablets (12.5 mg total) by mouth once a week   Multi-Vitamins Tabs Take 1 tablet by mouth daily.   nitrofurantoin (macrocrystal-monohydrate) 100 MG capsule Commonly known as: MACROBID Take 1 capsule (100 mg total) by mouth as needed (with sex).   OneTouch Ultra test strip Generic drug: glucose blood TEST 4 TO 6 TIMES DAILY AS DIRECTED   pantoprazole 40 MG tablet Commonly known as: PROTONIX TAKE 1 TABLET BY MOUTH ONCE DAILY   pantoprazole 40 MG tablet Commonly known as: PROTONIX Take 1 tablet (40 mg total) by mouth daily.   SUMAtriptan 100 MG tablet Commonly known as: IMITREX Take 100 mg by mouth as needed for  migraine.        Allergies:  Allergies  Allergen Reactions   Morphine Itching and Hives   Duloxetine Hcl     Other reaction(s): constipation   Gabapentin     Leg Pain   Morphine And Related Hives and Itching   Tramadol Hives and Itching    Family History: Family History  Problem Relation Age of Onset   Diabetes Sister    Arthritis Sister    Hyperlipidemia Sister    Lung cancer Mother    Cancer Mother        ?Uterine cancer   Alzheimer's disease Maternal Grandmother     Social History:   reports that she has never smoked. She has never used smokeless tobacco. She reports that she does not drink alcohol and does not use drugs.  Physical Exam:  BP 119/70   Pulse (!) 102   Ht '5\' 7"'$  (1.702 m)   Wt 151 lb 8 oz (68.7 kg)   BMI 23.73 kg/m   Constitutional:  Alert and oriented, no acute distress, nontoxic appearing HEENT: [place], AT Cardiovascular: No clubbing, cyanosis, or edema Respiratory: Normal respiratory effort, no increased work of breathing Skin: No rashes, bruises or suspicious lesions Neurologic: Grossly intact, no focal deficits, moving all 4 extremities Psychiatric: Normal mood and affect  Laboratory Data: Results for orders placed or performed in visit on 09/03/22  Microscopic Examination   Urine  Result Value Ref Range   WBC, UA 0-5 0 - 5 /hpf   RBC, Urine >30 (A) 0 - 2 /hpf   Epithelial Cells (non renal) 0-10 0 - 10 /hpf   Bacteria, UA Few None seen/Few  Urinalysis, Complete  Result Value Ref Range   Specific Gravity, UA CANCELED    pH, UA CANCELED    Color, UA Red (A) Yellow   Appearance Ur Cloudy (A) Clear   Protein,UA CANCELED    Glucose, UA CANCELED    Ketones, UA CANCELED    Microscopic Examination See below:    Assessment & Plan:   1. Recurrent UTI She reports monthly breakthrough infections despite postcoital prophylaxis, however there are no medical records available for me to review to confirm this.  We discussed that we may need to  consider increasing her to daily prophylaxis, however I recommend keeping her on postcoital therapy for now pending further follow-up with gastroenterology and OB/GYN.  I am refilling her with a short course of Macrobid and will plan to see her back in 3 months to reassess once these other visits have been completed.  She expressed understanding. - nitrofurantoin, macrocrystal-monohydrate, (MACROBID) 100 MG capsule; Take 1 capsule (100 mg total) by mouth as needed (with sex).  Dispense: 30 capsule; Refill: 2  2. Asymptomatic microscopic hematuria UA today is contaminated due to menstruation.  We discussed that we need to rule out true underlying hematuria, which would warrant further investigation.  Will plan to repeat a UA when I see her back in 3 months.  She expressed understanding. - Urinalysis, Complete  Return in about 3 months (around 12/02/2022) for Symptom recheck with repeat UA.  Debroah Loop, PA-C  [facility] [address] [place], [place] [address] [phone]

## 2022-09-05 ENCOUNTER — Encounter: Payer: Self-pay | Admitting: Family Medicine

## 2022-09-05 ENCOUNTER — Ambulatory Visit: Payer: Self-pay

## 2022-09-05 ENCOUNTER — Ambulatory Visit (INDEPENDENT_AMBULATORY_CARE_PROVIDER_SITE_OTHER): Payer: BC Managed Care – PPO | Admitting: Family Medicine

## 2022-09-05 VITALS — BP 130/80 | HR 97 | Ht 67.0 in | Wt 148.0 lb

## 2022-09-05 DIAGNOSIS — B029 Zoster without complications: Secondary | ICD-10-CM | POA: Insufficient documentation

## 2022-09-05 DIAGNOSIS — M25511 Pain in right shoulder: Secondary | ICD-10-CM

## 2022-09-05 DIAGNOSIS — B028 Zoster with other complications: Secondary | ICD-10-CM

## 2022-09-05 MED ORDER — TIZANIDINE HCL 4 MG PO TABS: 4.0000 mg | ORAL_TABLET | Freq: Every evening | ORAL | 2 refills | Status: AC

## 2022-09-05 MED ORDER — VALACYCLOVIR HCL 1 G PO TABS: 1000.0000 mg | ORAL_TABLET | Freq: Three times a day (TID) | ORAL | 0 refills | Status: DC

## 2022-09-05 MED ORDER — KETOROLAC TROMETHAMINE 60 MG/2ML IM SOLN
60.0000 mg | Freq: Once | INTRAMUSCULAR | Status: AC
  Administered 2022-09-05: 60 mg via INTRAMUSCULAR

## 2022-09-05 NOTE — Progress Notes (Signed)
[name] [place] Phone: [phone] Subjective:   Tiffany [name], am serving as a scribe for Dr. Hulan Saas.  I'm seeing this patient by the request  of:  [name], [name], FNP  CC: Right shoulder pain   [id number]  Tiffany [name] is a 52 y.o. female coming in with complaint of right shoulder pain going on for about 3 days. Patient locates pain to right shoulder  between the shoulder blade and neck. Pain is shooting down her back and down the front of her arm has not noticed any numbness or tingling yet. Patient is very stiff while sitting and does not move the shoulder or turn her neck. Patient states she can move her lower arm around but not her shoulder.  Patient woke up 3 days ago and she had some pain not aware of any MOI  Last night the pain was getting worse and this morning the paine was unbearable   Duration- constant  Character- aching Aggravating factors- pressure, laying down, sensitive to the touch Reliving factors- none Therapies tried- tylenol, heat, Biofreeze and tiger balm     Past Medical History:  Diagnosis Date   Arthritis    knees, lower back right side   Connective tissue disease ([place])    Depression    Diabetes mellitus    Diabetes mellitus type 1, uncontrolled, insulin dependent 04/03/2017   Fibromyalgia 12/02/2017   G6PD deficiency 10/26/2017   Gastroparesis    Headache(784.0)    Heart murmur    as a child - no problem as adult   History of kidney stones    no surgery   Hyperlipidemia    Lupus ([place])    Migraines 06/05/2020   Rheumatoid arthritis ([place]) 06/05/2020   Sjogren's syndrome without extraglandular involvement ([place]) 02/24/2018   Past Surgical History:  Procedure Laterality Date   CESAREAN SECTION  1994, 2006   x 2   COLONOSCOPY WITH PROPOFOL N/A 03/16/2013   Procedure: COLONOSCOPY WITH PROPOFOL;  Surgeon: Arta Silence, MD;  [facility];  Service: Endoscopy;   Laterality: N/A;   HYSTEROSCOPY WITH D & C N/A 07/11/2013   Procedure: DILATATION AND CURETTAGE /HYSTEROSCOPY;  Surgeon: Marvene Staff, MD;  Location: [facility];  Service: Gynecology;  Laterality: N/A;   left surgery shoulder     ROBOTIC ASSISTED LAPAROSCOPIC LYSIS OF ADHESION N/A 07/11/2013   Procedure: ROBOTIC ASSISTED LAPAROSCOPIC LYSIS OF ADHESIONS ; REPAIR OF UTERINE DEHISENCE;  Surgeon: Marvene Staff, MD;  Location: [facility];  Service: Gynecology;  Laterality: N/A;   TUBAL LIGATION     Social History   Socioeconomic History   Marital status: Married    Spouse name: Tiffany [name]   Number of children: 2   Years of education: some college   Highest education level: Not on file  Occupational History   Occupation: Unemployed  Tobacco Use   Smoking status: Never   Smokeless tobacco: Never  Vaping Use   Vaping Use: Never used  Substance and Sexual Activity   Alcohol use: No   Drug use: No   Sexual activity: Yes    Birth control/protection: Surgical  Other Topics Concern   Not on file  Social History Narrative   06/05/20   From: [place] and moved to [place] in 2003   Living: with husband, Tiffany [name] (2002) and daughter   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  Family: 2 daughters Tiffany [name] and Tiffany [name] (2006)      Enjoys: going out to eat      Exercise: joint pain limits activity   Diet: tries to followed diabetic diet      Safety   Seat belts: Yes    Guns: Yes  and secure   Safe in relationships: Yes    Social Determinants of Health   Financial Resource Strain: Not on file  Food Insecurity: Not on file  Transportation Needs: Not on file  Physical Activity: Not on file  Stress: Stress Concern Present (03/03/2022)   [place]    Feeling of Stress : Rather much  Social Connections: Not on file   Allergies  Allergen Reactions   Morphine Itching and Hives   Duloxetine  Hcl     Other reaction(s): constipation   Gabapentin     Leg Pain   Morphine And Related Hives and Itching   Tramadol Hives and Itching   Family History  Problem Relation Age of Onset   Diabetes Sister    Arthritis Sister    Hyperlipidemia Sister    Lung cancer Mother    Cancer Mother        ?Uterine cancer   Alzheimer's disease Maternal Grandmother     Current Outpatient Medications (Endocrine & Metabolic):    APIDRA SOLOSTAR 100 UNIT/ML Solostar Pen, Inject into the skin.   glucagon 1 MG injection, Inject 1 mg into the skin once as needed (low blood sugar).   insulin glargine (LANTUS SOLOSTAR) 100 UNIT/ML Solostar Pen, Inject 20 Units into the skin daily. If pump failure, take 20 units   insulin glulisine (APIDRA) 100 UNIT/ML injection, Inject via insulin pump up to 60 units daily for T1DM  Current Outpatient Medications (Cardiovascular):    atorvastatin (LIPITOR) 40 MG tablet, Take 1 tablet (40 mg total) by mouth every morning.  Current Outpatient Medications (Respiratory):    albuterol (VENTOLIN HFA) 108 (90 Base) MCG/ACT inhaler, Inhale 1-2 puffs into the lungs every 6 (six) hours as needed for wheezing or shortness of breath.   fluticasone (FLOVENT HFA) 44 MCG/ACT inhaler, Inhale 2 puffs into the lungs 2 (two) times daily.  Current Outpatient Medications (Analgesics):    SUMAtriptan (IMITREX) 100 MG tablet, Take 100 mg by mouth as needed for migraine.  Current Outpatient Medications (Hematological):    folic acid (FOLVITE) 1 MG tablet, Take 1 mg by mouth daily.  Current Outpatient Medications (Other):    aluminum chloride (DRYSOL) 20 % external solution, Apply topically every other day. For sweating. (Patient taking differently: Apply 1 application  topically every other day. For sweating.)   bisacodyl (DULCOLAX) 5 MG EC tablet, Take 5 mg by mouth daily as needed for mild constipation.   Continuous Blood Gluc Sensor (DEXCOM G6 SENSOR) MISC, Apply 1 sensor to the skin  every 10 days for continuous glucose monitoring.   Continuous Blood Gluc Transmit (DEXCOM G6 TRANSMITTER) MISC, Use as directed for continuous glucose monitoring. Reuse transmitter for 90 days then discard and replace.   linaclotide (LINZESS) 290 MCG CAPS capsule, Take 290 mcg by mouth daily.    methotrexate (RHEUMATREX) 2.5 MG tablet, Take 15 mg by mouth once a week. Take 6 tablets (12.5 mg total) by mouth once a week   Multiple Vitamin (MULTI-VITAMINS) TABS, Take 1 tablet by mouth daily.   nitrofurantoin, macrocrystal-monohydrate, (MACROBID) 100 MG capsule, Take 1 capsule (100 mg total) by mouth as needed (with sex).  ONETOUCH ULTRA test strip, TEST 4 TO 6 TIMES DAILY AS DIRECTED   pantoprazole (PROTONIX) 40 MG tablet, TAKE 1 TABLET BY MOUTH ONCE DAILY   pantoprazole (PROTONIX) 40 MG tablet, Take 1 tablet (40 mg total) by mouth daily.   tiZANidine (ZANAFLEX) 4 MG tablet, Take 1 tablet (4 mg total) by mouth Nightly for 10 days.   Urine Glucose-Ketones Test (KETO-DIASTIX) STRP, Use to check urine for ketones with high blood glucose   valACYclovir (VALTREX) 1000 MG tablet, Take 1 tablet (1,000 mg total) by mouth 3 (three) times daily.   Reviewed prior external information including notes and imaging from  primary care provider As well as notes that were available from care everywhere and other healthcare systems.  Past medical history, social, surgical and family history all reviewed in electronic medical record.  No pertanent information unless stated regarding to the chief complaint.   Review of Systems:  No headache, visual changes, nausea, vomiting, diarrhea, constipation, dizziness, abdominal pain, skin rash, fevers, chills, night sweats, weight loss, swollen lymph nodes, body aches, joint swelling, chest pain, shortness of breath, mood changes. POSITIVE muscle aches  Objective  Blood pressure 130/80, pulse 97, height 5' 7"$  (1.702 m), weight 148 lb (67.1 kg), SpO2 97 %.   General: No  apparent distress alert and oriented x3 mood and affect normal, dressed appropriately.  HEENT: Pupils equal, extraocular movements intact  Respiratory: Patient's speak in full sentences and does not appear short of breath  Cardiovascular: No lower extremity edema, non tender, no erythema  Patient is highly uncomfortable even on sitting.  Patient does not have any rash in the area but is severely tender to palpation over the trapezius going into the lateral shoulder to the light sensation.  Patient does not have any worsening pain going up the temporal artery area.  Patient able to move her eyes without any significant difficulty.  Full strength in the hand noted.  Neurovascularly intact distally.  Unable to do shoulder exam secondary to the severity of pain and patient having voluntary guarding.    Impression and Recommendations:    The above documentation has been reviewed and is accurate and complete Lyndal Pulley, DO

## 2022-09-05 NOTE — Patient Instructions (Signed)
Take the valtrex 3 times a day for 5 days  Zanaflex 4 mg at night, if very bad can take 1 time during the day  Ice or heat or whatever helps [place]  lotion can help  See eye doctor If worsening then seek medical attention  Have an appointment in 2 weeks just in case but also write me on Monday so I know you are doing better

## 2022-09-05 NOTE — Assessment & Plan Note (Addendum)
Patient does have what appears to be an early shingles.  Patient has had ocular shingles previously which is concerning intending to follow-up with her ophthalmologist.  In addition to this we did discuss Valtrex which patient has only started for 1 day but will increase to 3 times a day for the next 5 days then daily thereafter to try to stop any other type of recurrence.  Patient is highly uncomfortable we discussed muscle relaxer to take at night.  Discussed we could consider the possibility of prednisone but with patient being a type I diabetic as well as the possibility of making the shingles worse and wanted to hold.  We did discuss potential imaging but at the moment patient is in so much pain ultrasound follow-up with me again in 6 to 4 weeks if it does not completely resolve.  Discussed if worsening pain to seek medical attention immediately.  Patient also given injection of Toradol.

## 2022-09-08 ENCOUNTER — Encounter (INDEPENDENT_AMBULATORY_CARE_PROVIDER_SITE_OTHER): Payer: Medicare Other | Admitting: Ophthalmology

## 2022-09-08 ENCOUNTER — Other Ambulatory Visit: Payer: Self-pay

## 2022-09-08 ENCOUNTER — Telehealth: Payer: Self-pay

## 2022-09-08 DIAGNOSIS — H3581 Retinal edema: Secondary | ICD-10-CM

## 2022-09-08 DIAGNOSIS — M546 Pain in thoracic spine: Secondary | ICD-10-CM

## 2022-09-08 DIAGNOSIS — M542 Cervicalgia: Secondary | ICD-10-CM

## 2022-09-08 DIAGNOSIS — M25511 Pain in right shoulder: Secondary | ICD-10-CM

## 2022-09-08 DIAGNOSIS — M2550 Pain in unspecified joint: Secondary | ICD-10-CM

## 2022-09-08 NOTE — Telephone Encounter (Signed)
Patient does not think it is shingles states that she has not had any signs of the shingles but is still having the pain would like a call back to discuss. Injection did help but the pain is slowly coming back

## 2022-09-08 NOTE — Telephone Encounter (Signed)
Lets have patient come in and get labs done.  ESR, CRP, CBC, CMET TSH is significantly worsening patient does need to go to the emergency room.  If she would like we can try prednisone 15 mg daily as well.

## 2022-09-09 ENCOUNTER — Encounter: Payer: Self-pay | Admitting: Family

## 2022-09-09 ENCOUNTER — Ambulatory Visit (INDEPENDENT_AMBULATORY_CARE_PROVIDER_SITE_OTHER): Payer: BC Managed Care – PPO

## 2022-09-09 ENCOUNTER — Other Ambulatory Visit: Payer: Self-pay

## 2022-09-09 ENCOUNTER — Ambulatory Visit (INDEPENDENT_AMBULATORY_CARE_PROVIDER_SITE_OTHER): Payer: BC Managed Care – PPO | Admitting: Family

## 2022-09-09 ENCOUNTER — Other Ambulatory Visit (INDEPENDENT_AMBULATORY_CARE_PROVIDER_SITE_OTHER): Payer: BC Managed Care – PPO

## 2022-09-09 VITALS — BP 122/78 | HR 98 | Temp 98.5°F | Ht 67.0 in | Wt 152.6 lb

## 2022-09-09 DIAGNOSIS — M546 Pain in thoracic spine: Secondary | ICD-10-CM | POA: Diagnosis not present

## 2022-09-09 DIAGNOSIS — M25511 Pain in right shoulder: Secondary | ICD-10-CM

## 2022-09-09 DIAGNOSIS — G4762 Sleep related leg cramps: Secondary | ICD-10-CM | POA: Diagnosis not present

## 2022-09-09 DIAGNOSIS — R232 Flushing: Secondary | ICD-10-CM | POA: Diagnosis not present

## 2022-09-09 DIAGNOSIS — D509 Iron deficiency anemia, unspecified: Secondary | ICD-10-CM | POA: Diagnosis not present

## 2022-09-09 DIAGNOSIS — M542 Cervicalgia: Secondary | ICD-10-CM

## 2022-09-09 DIAGNOSIS — M47812 Spondylosis without myelopathy or radiculopathy, cervical region: Secondary | ICD-10-CM | POA: Diagnosis not present

## 2022-09-09 DIAGNOSIS — M2550 Pain in unspecified joint: Secondary | ICD-10-CM | POA: Diagnosis not present

## 2022-09-09 LAB — COMPREHENSIVE METABOLIC PANEL
ALT: 12 U/L (ref 0–35)
AST: 20 U/L (ref 0–37)
Albumin: 4.3 g/dL (ref 3.5–5.2)
Alkaline Phosphatase: 82 U/L (ref 39–117)
BUN: 20 mg/dL (ref 6–23)
CO2: 29 mEq/L (ref 19–32)
Calcium: 9.2 mg/dL (ref 8.4–10.5)
Chloride: 101 mEq/L (ref 96–112)
Creatinine, Ser: 0.75 mg/dL (ref 0.40–1.20)
GFR: 91.68 mL/min (ref >60.00)
Glucose, Bld: 162 mg/dL — ABNORMAL HIGH (ref 70–99)
Potassium: 4.1 mEq/L (ref 3.5–5.1)
Sodium: 137 mEq/L (ref 135–145)
Total Bilirubin: 0.4 mg/dL (ref 0.2–1.2)
Total Protein: 7.7 g/dL (ref 6.0–8.3)

## 2022-09-09 LAB — CBC WITH DIFFERENTIAL/PLATELET
Basophils Absolute: 0 10*3/uL (ref 0.0–0.1)
Basophils Relative: 0.8 % (ref 0.0–3.0)
Eosinophils Absolute: 0.1 10*3/uL (ref 0.0–0.7)
Eosinophils Relative: 1 % (ref 0.0–5.0)
HCT: 32.9 % — ABNORMAL LOW (ref 36.0–46.0)
Hemoglobin: 10.3 g/dL — ABNORMAL LOW (ref 12.0–15.0)
Lymphocytes Relative: 27.9 % (ref 12.0–46.0)
Lymphs Abs: 1.6 10*3/uL (ref 0.7–4.0)
MCHC: 31.3 g/dL (ref 30.0–36.0)
MCV: 74.9 fl — ABNORMAL LOW (ref 78.0–100.0)
Monocytes Absolute: 0.5 10*3/uL (ref 0.1–1.0)
Monocytes Relative: 9 % (ref 3.0–12.0)
Neutro Abs: 3.5 10*3/uL (ref 1.4–7.7)
Neutrophils Relative %: 61.3 % (ref 43.0–77.0)
Platelets: 407 10*3/uL — ABNORMAL HIGH (ref 150.0–400.0)
RBC: 4.39 Mil/uL (ref 3.87–5.11)
RDW: 17.2 % — ABNORMAL HIGH (ref 11.5–15.5)
WBC: 5.7 10*3/uL (ref 4.0–10.5)

## 2022-09-09 LAB — TSH: TSH: 2.12 u[IU]/mL (ref 0.35–5.50)

## 2022-09-09 LAB — C-REACTIVE PROTEIN: CRP: <1 mg/dL (ref 0.5–20.0)

## 2022-09-09 LAB — SEDIMENTATION RATE: Sed Rate: 92 mm/hr — ABNORMAL HIGH (ref 0–30)

## 2022-09-09 MED ORDER — PREDNISONE 20 MG PO TABS: ORAL_TABLET | ORAL | 0 refills | Status: DC

## 2022-09-09 MED ORDER — KETOROLAC TROMETHAMINE 30 MG/ML IJ SOLN
30.0000 mg | Freq: Once | INTRAMUSCULAR | Status: AC
  Administered 2022-09-09: 30 mg via INTRAMUSCULAR

## 2022-09-09 MED ORDER — METHYLPREDNISOLONE ACETATE 40 MG/ML IJ SUSP
40.0000 mg | Freq: Once | INTRAMUSCULAR | Status: AC
  Administered 2022-09-09: 40 mg via INTRAMUSCULAR

## 2022-09-09 NOTE — Assessment & Plan Note (Signed)
Recommendation for mag malate otc
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
## 2022-09-09 NOTE — Assessment & Plan Note (Signed)
D/w pt various options will order labs as well today although likely due to menopause  Labs ordered pending results Did discuss could try otc black cohash and or we can try RX brisdelle.  Otherwise for hormonal options, consult with GYN

## 2022-09-09 NOTE — Telephone Encounter (Signed)
Patient would like to do lab and xray. Orders placed.

## 2022-09-09 NOTE — Progress Notes (Signed)
Toradol 51m and Depo 441minjection given to patient per Dr. SmTamala JulianPatient tolerated injection well

## 2022-09-09 NOTE — Patient Instructions (Addendum)
------------------------------------   Consider magnesium malate 300 mg  I can usually find this on amazon  ------------------------------------   Look up black cohash this is herbal and over the counter for hot flashes.  Also can consider brisdelle which is low dose paxil which can help as well.   Can f/u with GYN also to consider hormonal options.   Welcome to our clinic, I am happy to have you as my new patient. I am excited to continue on this healthcare journey with you.  ------------------------------------  Stop by the lab prior to leaving today. I will notify you of your results once received.   Please keep in mind Any my chart messages you send have up to a three business day turnaround for a response.  Phone calls may take up to a one full business day turnaround for a  response.   If you need a medication refill I recommend you request it through the pharmacy as this is easiest for [place] rather than sending a message and or phone call.   Due to recent changes in healthcare laws, you may see results of your imaging and/or laboratory studies on MyChart before I have had a chance to review them.  I understand that in some cases there may be results that are confusing or concerning to you. Please understand that not all results are received at the same time and often I may need to interpret multiple results in order to provide you with the best plan of care or course of treatment. Therefore, I ask that you please give me 2 business days to thoroughly review all your results before contacting my office for clarification. Should we see a critical lab result, you will be contacted sooner.   It was a pleasure seeing you today! Please do not hesitate to reach out with any questions and or concerns.  Regards,   Eugenia Pancoast FNP-C

## 2022-09-09 NOTE — Assessment & Plan Note (Signed)
Reviewed cbc Ordering b12 as well as repeat cbc ibc ferritin  pending results.

## 2022-09-12 DIAGNOSIS — E1043 Type 1 diabetes mellitus with diabetic autonomic (poly)neuropathy: Secondary | ICD-10-CM | POA: Diagnosis not present

## 2022-09-16 ENCOUNTER — Ambulatory Visit (INDEPENDENT_AMBULATORY_CARE_PROVIDER_SITE_OTHER): Payer: BC Managed Care – PPO | Admitting: Sports Medicine

## 2022-09-16 VITALS — BP 132/80 | HR 98 | Ht 67.0 in | Wt 150.0 lb

## 2022-09-16 DIAGNOSIS — M542 Cervicalgia: Secondary | ICD-10-CM

## 2022-09-16 MED ORDER — GABAPENTIN 100 MG PO CAPS: 100.0000 mg | ORAL_CAPSULE | Freq: Two times a day (BID) | ORAL | 0 refills | Status: AC

## 2022-09-16 NOTE — Patient Instructions (Addendum)
Good to see you Recommend picking up and starting prednisone course Tylenol [phone] mg 2-3 times a day for pain relief  Can restart gabapentin 100 mg 2 times a day  Use muscle relaxer at night as needed for muscle spasm  Continue HEP for neck  2 week follow up

## 2022-09-16 NOTE — Progress Notes (Signed)
Tiffany [name] D.[facility] [place] [facility] [place] Phone: [phone]   Assessment and Plan:     1. Cervical spine pain 2. Neck pain -Acute with exacerbation, complicated, subsequent visit - Patient continues to have significant neck pain radiating into right trapezius, though does not seem to radiate into right upper extremity.  Pain is causing significantly limited ROM of the neck and tension headaches - Currently unclear etiology of patient's symptoms.  No systemic symptoms including no fever or chills.  Mild weakness globally through right upper extremity, though patient is limited due to pain.  No skin changes or lesions around neck, back, or shoulder.  Relatively unremarkable x-ray images of C-spine, T-spine, right shoulder.  Complicated past medical history with rheumatoid arthritis, Sjogren's, DM type I, fibromyalgia, G6PD deficiency, migraines, lupus, history of shingles - Patient did have mild and temporary relief after IM methylprednisone injection, however pain had fully returned within 2 days.  Patient did not experience benefit with Valtrex. - Patient did not start prednisone due to issues with her insurance and not having her typical pod for DM type I.  Patient now has all of her diabetic supplies and is willing to start prednisone course.  Recommend starting previously prescribed prednisone course with plan of prednisone 40 mg x 5 days, prednisone 20 mg x 5 days, prednisone 10 mg x 5 days.  Educated that this will temporarily increase her blood glucose while taking medication. - May continue muscle relaxer at night as needed for muscle spasms - Start Tylenol 500 to 1000 mg tablets 2-3 times a day for day-to-day pain relief - Patient states that she has a prescription of gabapentin at home.  May restart gabapentin 100 mg twice daily - Patient has follow-up scheduled with rheumatology 09/19/2022, recommend keeping this  appointment  Other orders - gabapentin (NEURONTIN) 100 MG capsule; Take 1 capsule (100 mg total) by mouth 2 (two) times daily.    Pertinent previous records reviewed include lab work from 09/09/2022 including relatively unremarkable CMP, elevated sed rate at 92, and unremarkable TSH, and microcytic anemia with hemoglobin 10.3 and MCV 74.9 which may be either microcytic anemia or anemia of chronic disease   Time of visit 33 minutes, which included chart review, physical exam, treatment plan being performed, interpreted, and discussed with patient at today's visit.   Follow Up: 2 weeks for reevaluation.  Would review rheumatology recommendations at that time.  Could discuss advanced imaging versus trigger point injections versus NSAID course   Subjective:   I, [name], am serving as a Education administrator for Doctor Glennon Mac  Chief Complaint: neck , shoulder and upper trap   HPI:   09/05/2022 Tiffany [name] is a 52 y.o. female coming in with complaint of right shoulder pain going on for about 3 days. Patient locates pain to right shoulder  between the shoulder blade and neck. Pain is shooting down her back and down the front of her arm has not noticed any numbness or tingling yet. Patient is very stiff while sitting and does not move the shoulder or turn her neck. Patient states she can move her lower arm around but not her shoulder.   Patient woke up 3 days ago and she had some pain not aware of any MOI   Last night the pain was getting worse and this morning the paine was unbearable     Duration- constant  Character- aching Aggravating factors- pressure, laying down, sensitive to  the touch Reliving factors- none Therapies tried- tylenol, heat, Biofreeze and tiger balm  09/16/2022 Patient states that the pain has not gone away she has had 3 shots, is seeing her rheumatologist next Friday , she isn't able to sleep   Additional pertinent review of systems negative.   Current  Outpatient Medications:    albuterol (VENTOLIN HFA) 108 (90 Base) MCG/ACT inhaler, Inhale 1-2 puffs into the lungs every 6 (six) hours as needed for wheezing or shortness of breath., Disp: 1 each, Rfl: 0   aluminum chloride (DRYSOL) 20 % external solution, Apply topically every other day. For sweating. (Patient taking differently: Apply 1 application  topically every other day. For sweating.), Disp: 35 mL, Rfl: 1   APIDRA SOLOSTAR 100 UNIT/ML Solostar Pen, Inject into the skin., Disp: , Rfl:    atorvastatin (LIPITOR) 40 MG tablet, Take 1 tablet (40 mg total) by mouth every morning., Disp: 90 tablet, Rfl: 3   bisacodyl (DULCOLAX) 5 MG EC tablet, Take 5 mg by mouth daily as needed for mild constipation., Disp: , Rfl:    Continuous Blood Gluc Sensor (DEXCOM G6 SENSOR) MISC, Apply 1 sensor to the skin every 10 days for continuous glucose monitoring., Disp: , Rfl:    Continuous Blood Gluc Transmit (DEXCOM G6 TRANSMITTER) MISC, Use as directed for continuous glucose monitoring. Reuse transmitter for 90 days then discard and replace., Disp: , Rfl:    fluticasone (FLOVENT HFA) 44 MCG/ACT inhaler, Inhale 2 puffs into the lungs 2 (two) times daily., Disp: 1 each, Rfl: 6   folic acid (FOLVITE) 1 MG tablet, Take 1 mg by mouth daily., Disp: , Rfl:    gabapentin (NEURONTIN) 100 MG capsule, Take 1 capsule (100 mg total) by mouth 2 (two) times daily., Disp: 60 capsule, Rfl: 0   glucagon 1 MG injection, Inject 1 mg into the skin once as needed (low blood sugar)., Disp: , Rfl:    insulin glargine (LANTUS SOLOSTAR) 100 UNIT/ML Solostar Pen, Inject 20 Units into the skin daily. If pump failure, take 20 units, Disp: , Rfl:    insulin glulisine (APIDRA) 100 UNIT/ML injection, Inject via insulin pump up to 60 units daily for T1DM, Disp: , Rfl:    linaclotide (LINZESS) 290 MCG CAPS capsule, Take 290 mcg by mouth daily. , Disp: , Rfl:    methotrexate (RHEUMATREX) 2.5 MG tablet, Take 15 mg by mouth once a week. Take 6 tablets  (12.5 mg total) by mouth once a week, Disp: , Rfl:    Multiple Vitamin (MULTI-VITAMINS) TABS, Take 1 tablet by mouth daily., Disp: , Rfl:    nitrofurantoin, macrocrystal-monohydrate, (MACROBID) 100 MG capsule, Take 1 capsule (100 mg total) by mouth as needed (with sex)., Disp: 30 capsule, Rfl: 2   ONETOUCH ULTRA test strip, TEST 4 TO 6 TIMES DAILY AS DIRECTED, Disp: , Rfl:    pantoprazole (PROTONIX) 40 MG tablet, TAKE 1 TABLET BY MOUTH ONCE DAILY, Disp: 30 tablet, Rfl: 1   predniSONE (DELTASONE) 20 MG tablet, Prednisone 40 mg for 5 days, 20 mg for 5 days and 10 mg for 5 days, Disp: 18 tablet, Rfl: 0   SUMAtriptan (IMITREX) 100 MG tablet, Take 100 mg by mouth as needed for migraine., Disp: , Rfl:    Urine Glucose-Ketones Test (KETO-DIASTIX) STRP, Use to check urine for ketones with high blood glucose, Disp: , Rfl:    Objective:     Vitals:   09/16/22 1331  BP: 132/80  Pulse: 98  SpO2: 99%  Weight: 150  lb (68 kg)  Height: 5' 7"$  (1.702 m)      Body mass index is 23.49 kg/m.    Physical Exam:    Neck Exam: Cervical Spine- Posture normal Skin- normal, intact  Neurological-  Strength-  Right Left   Deltoid (C5) 4/5 5/5  Bicep/Brachioradialis (C5/6) 5/5  5/5  Wrist Extension (C6) 5/5 5/5  Tricep (C7) 5/5 5/5  Wrist Flexion (C7) 5/5 5/5  Grip (C8) 4/5 5/5  Finger Abduction (T1) 4/5 5/5   Sensation: intact to light touch in upper extremities bilaterally  Spurling's: Painful, though no radicular symptoms Neck ROM: Severely reduced range of motion in all directions TTP: Cervical spinous processes, cervical paraspinal, right trapezius, right thoracic paraspinal NTTP: Left thoracic paraspinal, left trapezius    Electronically signed by:  Tiffany [name] D.[name] 2:10 PM 09/16/22

## 2022-09-24 DIAGNOSIS — Z124 Encounter for screening for malignant neoplasm of cervix: Secondary | ICD-10-CM | POA: Diagnosis not present

## 2022-09-24 DIAGNOSIS — Z01419 Encounter for gynecological examination (general) (routine) without abnormal findings: Secondary | ICD-10-CM | POA: Diagnosis not present

## 2022-09-26 ENCOUNTER — Ambulatory Visit (INDEPENDENT_AMBULATORY_CARE_PROVIDER_SITE_OTHER): Payer: BC Managed Care – PPO | Admitting: Sports Medicine

## 2022-09-26 VITALS — HR 88 | Ht 67.0 in | Wt 150.0 lb

## 2022-09-26 DIAGNOSIS — R29898 Other symptoms and signs involving the musculoskeletal system: Secondary | ICD-10-CM | POA: Diagnosis not present

## 2022-09-26 DIAGNOSIS — M2550 Pain in unspecified joint: Secondary | ICD-10-CM | POA: Diagnosis not present

## 2022-09-26 DIAGNOSIS — M542 Cervicalgia: Secondary | ICD-10-CM

## 2022-09-26 DIAGNOSIS — Z79899 Other long term (current) drug therapy: Secondary | ICD-10-CM | POA: Diagnosis not present

## 2022-09-26 DIAGNOSIS — M0609 Rheumatoid arthritis without rheumatoid factor, multiple sites: Secondary | ICD-10-CM | POA: Diagnosis not present

## 2022-09-26 MED ORDER — MELOXICAM 15 MG PO TABS: 15.0000 mg | ORAL_TABLET | Freq: Every day | ORAL | 0 refills | Status: DC

## 2022-09-26 NOTE — Progress Notes (Signed)
Tiffany [name] D.[place] Phone: [phone]   Assessment and Plan:     1. Cervical spine pain 2. Neck pain 3. Right arm weakness -Acute with exacerbation, complicated, subsequent visit - Unclear etiology of continued neck pain into right shoulder and radiating into right upper extremity.  Red flag sign of worsening weakness in right upper extremity warrants MRI evaluation of C-spine - Patient has had no improvement with 2-week course of prednisone.  Recommend discontinuing prednisone at this time - Start meloxicam 15 mg daily x2 weeks.  If still having pain after 2 weeks, complete 3rd-week of meloxicam. May use remaining meloxicam as needed once daily for pain control.  Do not to use additional NSAIDs while taking meloxicam.  May use Tylenol [phone] mg 2 to 3 times a day for breakthrough pain. - Continue Flexeril 5 to 10 mg nightly as needed for muscle spasms - May increase to gabapentin 200 to 300 mg 2 times a day -- MR CERVICAL SPINE WO CONTRAST; Future - meloxicam (MOBIC) 15 MG tablet; Take 1 tablet (15 mg total) by mouth daily.    Pertinent previous records reviewed include none   Follow Up: 3 days after MRI to review results and discuss treatment plan   Subjective:   I, Pincus Badder, am serving as a Education administrator for Doctor Glennon Mac   Chief Complaint: neck , shoulder and upper trap    HPI:    09/05/2022 Tiffany [name] is a 52 y.o. female coming in with complaint of right shoulder pain going on for about 3 days. Patient locates pain to right shoulder  between the shoulder blade and neck. Pain is shooting down her back and down the front of her arm has not noticed any numbness or tingling yet. Patient is very stiff while sitting and does not move the shoulder or turn her neck. Patient states she can move her lower arm around but not her shoulder.   Patient woke up 3 days ago and she had some pain not  aware of any MOI   Last night the pain was getting worse and this morning the paine was unbearable     Duration- constant  Character- aching Aggravating factors- pressure, laying down, sensitive to the touch Reliving factors- none Therapies tried- tylenol, heat, Biofreeze and tiger balm   09/16/2022 Patient states that the pain has not gone away she has had 3 shots, is seeing her rheumatologist next Friday , she isn't able to sleep   09/26/2022 Patient states that she is still in pain     Additional pertinent review of systems negative.   Current Outpatient Medications:    albuterol (VENTOLIN HFA) 108 (90 Base) MCG/ACT inhaler, Inhale 1-2 puffs into the lungs every 6 (six) hours as needed for wheezing or shortness of breath., Disp: 1 each, Rfl: 0   aluminum chloride (DRYSOL) 20 % external solution, Apply topically every other day. For sweating. (Patient taking differently: Apply 1 application  topically every other day. For sweating.), Disp: 35 mL, Rfl: 1   APIDRA SOLOSTAR 100 UNIT/ML Solostar Pen, Inject into the skin., Disp: , Rfl:    atorvastatin (LIPITOR) 40 MG tablet, Take 1 tablet (40 mg total) by mouth every morning., Disp: 90 tablet, Rfl: 3   bisacodyl (DULCOLAX) 5 MG EC tablet, Take 5 mg by mouth daily as needed for mild constipation., Disp: , Rfl:    Continuous Blood Gluc Sensor (DEXCOM G6 SENSOR) MISC,  Apply 1 sensor to the skin every 10 days for continuous glucose monitoring., Disp: , Rfl:    Continuous Blood Gluc Transmit (DEXCOM G6 TRANSMITTER) MISC, Use as directed for continuous glucose monitoring. Reuse transmitter for 90 days then discard and replace., Disp: , Rfl:    fluticasone (FLOVENT HFA) 44 MCG/ACT inhaler, Inhale 2 puffs into the lungs 2 (two) times daily., Disp: 1 each, Rfl: 6   folic acid (FOLVITE) 1 MG tablet, Take 1 mg by mouth daily., Disp: , Rfl:    gabapentin (NEURONTIN) 100 MG capsule, Take 1 capsule (100 mg total) by mouth 2 (two) times daily., Disp: 60  capsule, Rfl: 0   glucagon 1 MG injection, Inject 1 mg into the skin once as needed (low blood sugar)., Disp: , Rfl:    insulin glargine (LANTUS SOLOSTAR) 100 UNIT/ML Solostar Pen, Inject 20 Units into the skin daily. If pump failure, take 20 units, Disp: , Rfl:    insulin glulisine (APIDRA) 100 UNIT/ML injection, Inject via insulin pump up to 60 units daily for T1DM, Disp: , Rfl:    linaclotide (LINZESS) 290 MCG CAPS capsule, Take 290 mcg by mouth daily. , Disp: , Rfl:    meloxicam (MOBIC) 15 MG tablet, Take 1 tablet (15 mg total) by mouth daily., Disp: 30 tablet, Rfl: 0   methotrexate (RHEUMATREX) 2.5 MG tablet, Take 15 mg by mouth once a week. Take 6 tablets (12.5 mg total) by mouth once a week, Disp: , Rfl:    Multiple Vitamin (MULTI-VITAMINS) TABS, Take 1 tablet by mouth daily., Disp: , Rfl:    nitrofurantoin, macrocrystal-monohydrate, (MACROBID) 100 MG capsule, Take 1 capsule (100 mg total) by mouth as needed (with sex)., Disp: 30 capsule, Rfl: 2   ONETOUCH ULTRA test strip, TEST 4 TO 6 TIMES DAILY AS DIRECTED, Disp: , Rfl:    pantoprazole (PROTONIX) 40 MG tablet, TAKE 1 TABLET BY MOUTH ONCE DAILY, Disp: 30 tablet, Rfl: 1   predniSONE (DELTASONE) 20 MG tablet, Prednisone 40 mg for 5 days, 20 mg for 5 days and 10 mg for 5 days, Disp: 18 tablet, Rfl: 0   SUMAtriptan (IMITREX) 100 MG tablet, Take 100 mg by mouth as needed for migraine., Disp: , Rfl:    Urine Glucose-Ketones Test (KETO-DIASTIX) STRP, Use to check urine for ketones with high blood glucose, Disp: , Rfl:    Objective:     Vitals:   09/26/22 1103  Pulse: 88  SpO2: 100%  Weight: 150 lb (68 kg)  Height: '5\' 7"'$  (1.702 m)      Body mass index is 23.49 kg/m.    Physical Exam:                   Tiffany [name] D.[place] Phone: [phone]    Assessment and Plan:    1. Cervical spine pain 2. Neck pain -Acute with exacerbation, complicated, subsequent  visit - Patient continues to have significant neck pain radiating into right trapezius, though does not seem to radiate into right upper extremity.  Pain is causing significantly limited ROM of the neck and tension headaches - Currently unclear etiology of patient's symptoms.  No systemic symptoms including no fever or chills.  Mild weakness globally through right upper extremity, though patient is limited due to pain.  No skin changes or lesions around neck, back, or shoulder.  Relatively unremarkable x-ray images of C-spine, T-spine, right shoulder.  Complicated past medical history with rheumatoid  arthritis, Sjogren's, DM type I, fibromyalgia, G6PD deficiency, migraines, lupus, history of shingles - Patient did have mild and temporary relief after IM methylprednisone injection, however pain had fully returned within 2 days.  Patient did not experience benefit with Valtrex. - Patient did not start prednisone due to issues with her insurance and not having her typical pod for DM type I.  Patient now has all of her diabetic supplies and is willing to start prednisone course.  Recommend starting previously prescribed prednisone course with plan of prednisone 40 mg x 5 days, prednisone 20 mg x 5 days, prednisone 10 mg x 5 days.  Educated that this will temporarily increase her blood glucose while taking medication. - May continue muscle relaxer at night as needed for muscle spasms - Start Tylenol 500 to 1000 mg tablets 2-3 times a day for day-to-day pain relief - Patient states that she has a prescription of gabapentin at home.  May restart gabapentin 100 mg twice daily - Patient has follow-up scheduled with rheumatology 09/19/2022, recommend keeping this appointment   Other orders - gabapentin (NEURONTIN) 100 MG capsule; Take 1 capsule (100 mg total) by mouth 2 (two) times daily.    Pertinent previous records reviewed include lab work from 09/09/2022 including relatively unremarkable CMP, elevated sed rate  at 92, and unremarkable TSH, and microcytic anemia with hemoglobin 10.3 and MCV 74.9 which may be either microcytic anemia or anemia of chronic disease   Time of visit 33 minutes, which included chart review, physical exam, treatment plan being performed, interpreted, and discussed with patient at today's visit.     Follow Up: 2 weeks for reevaluation.  Would review rheumatology recommendations at that time.  Could discuss advanced imaging versus trigger point injections versus NSAID course    Subjective:  I, [name], am serving as a Education administrator for Doctor Glennon Mac   Chief Complaint: neck , shoulder and upper trap    HPI:    09/05/2022 Tiffany L Shuler is a 52 y.o. female coming in with complaint of right shoulder pain going on for about 3 days. Patient locates pain to right shoulder  between the shoulder blade and neck. Pain is shooting down her back and down the front of her arm has not noticed any numbness or tingling yet. Patient is very stiff while sitting and does not move the shoulder or turn her neck. Patient states she can move her lower arm around but not her shoulder.   Patient woke up 3 days ago and she had some pain not aware of any MOI   Last night the pain was getting worse and this morning the paine was unbearable     Duration- constant  Character- aching Aggravating factors- pressure, laying down, sensitive to the touch Reliving factors- none Therapies tried- tylenol, heat, Biofreeze and tiger balm   09/16/2022 Patient states that the pain has not gone away she has had 3 shots, is seeing her rheumatologist next Friday , she isn't able to sleep    Additional pertinent review of systems negative.     Current Outpatient Medications:    albuterol (VENTOLIN HFA) 108 (90 Base) MCG/ACT inhaler, Inhale 1-2 puffs into the lungs every 6 (six) hours as needed for wheezing or shortness of breath., Disp: 1 each, Rfl: 0   aluminum chloride (DRYSOL) 20 % external  solution, Apply topically every other day. For sweating. (Patient taking differently: Apply 1 application  topically every other day. For sweating.), Disp: 35 mL, Rfl: 1  APIDRA SOLOSTAR 100 UNIT/ML Solostar Pen, Inject into the skin., Disp: , Rfl:    atorvastatin (LIPITOR) 40 MG tablet, Take 1 tablet (40 mg total) by mouth every morning., Disp: 90 tablet, Rfl: 3   bisacodyl (DULCOLAX) 5 MG EC tablet, Take 5 mg by mouth daily as needed for mild constipation., Disp: , Rfl:    Continuous Blood Gluc Sensor (DEXCOM G6 SENSOR) MISC, Apply 1 sensor to the skin every 10 days for continuous glucose monitoring., Disp: , Rfl:    Continuous Blood Gluc Transmit (DEXCOM G6 TRANSMITTER) MISC, Use as directed for continuous glucose monitoring. Reuse transmitter for 90 days then discard and replace., Disp: , Rfl:    fluticasone (FLOVENT HFA) 44 MCG/ACT inhaler, Inhale 2 puffs into the lungs 2 (two) times daily., Disp: 1 each, Rfl: 6   folic acid (FOLVITE) 1 MG tablet, Take 1 mg by mouth daily., Disp: , Rfl:    gabapentin (NEURONTIN) 100 MG capsule, Take 1 capsule (100 mg total) by mouth 2 (two) times daily., Disp: 60 capsule, Rfl: 0   glucagon 1 MG injection, Inject 1 mg into the skin once as needed (low blood sugar)., Disp: , Rfl:    insulin glargine (LANTUS SOLOSTAR) 100 UNIT/ML Solostar Pen, Inject 20 Units into the skin daily. If pump failure, take 20 units, Disp: , Rfl:    insulin glulisine (APIDRA) 100 UNIT/ML injection, Inject via insulin pump up to 60 units daily for T1DM, Disp: , Rfl:    linaclotide (LINZESS) 290 MCG CAPS capsule, Take 290 mcg by mouth daily. , Disp: , Rfl:    methotrexate (RHEUMATREX) 2.5 MG tablet, Take 15 mg by mouth once a week. Take 6 tablets (12.5 mg total) by mouth once a week, Disp: , Rfl:    Multiple Vitamin (MULTI-VITAMINS) TABS, Take 1 tablet by mouth daily., Disp: , Rfl:    nitrofurantoin, macrocrystal-monohydrate, (MACROBID) 100 MG capsule, Take 1 capsule (100 mg total) by  mouth as needed (with sex)., Disp: 30 capsule, Rfl: 2   ONETOUCH ULTRA test strip, TEST 4 TO 6 TIMES DAILY AS DIRECTED, Disp: , Rfl:    pantoprazole (PROTONIX) 40 MG tablet, TAKE 1 TABLET BY MOUTH ONCE DAILY, Disp: 30 tablet, Rfl: 1   predniSONE (DELTASONE) 20 MG tablet, Prednisone 40 mg for 5 days, 20 mg for 5 days and 10 mg for 5 days, Disp: 18 tablet, Rfl: 0   SUMAtriptan (IMITREX) 100 MG tablet, Take 100 mg by mouth as needed for migraine., Disp: , Rfl:    Urine Glucose-Ketones Test (KETO-DIASTIX) STRP, Use to check urine for ketones with high blood glucose, Disp: , Rfl:      Objective:     Vitals:   09/16/22 1331 BP: 132/80 Pulse: 98 SpO2: 99% Weight: 150 lb (68 kg) Height: '5\' 7"'$  (1.702 m)     Body mass index is 23.49 kg/m.     Physical Exam:    Neck Exam: Cervical Spine- Posture normal Skin- normal, intact   Neurological-  Strength-        Right Left  Deltoid (C5)    3/5 5/5 Bicep/Brachioradialis (C5/6)  4/5  5/5 Wrist Extension (C6)   4/5 5/5 Tricep (C7)    4/5 5/5 Wrist Flexion (C7)   4/5 5/5 Grip (C8)    3/5 5/5 Finger Abduction (T1)  3/5 5/5   Sensation: Mildly decreased sensation to right upper extremity compared to left    Spurling's: Painful, though no radicular symptoms Neck ROM: Severely reduced range of motion  in all directions TTP: Cervical spinous processes, cervical paraspinal, right trapezius, right thoracic paraspinal NTTP: Left thoracic paraspinal, left trapezius        Electronically signed by:  Tiffany [name] D.[name] 11:46 AM 09/26/22

## 2022-09-26 NOTE — Patient Instructions (Addendum)
Good to see you Cervical MRI Discontinue prednisone - Start meloxicam 15 mg daily x2 weeks.  If still having pain after 2 weeks, complete 3rd-week of meloxicam. May use remaining meloxicam as needed once daily for pain control.  Do not to use additional NSAIDs while taking meloxicam.  May use Tylenol [phone] mg 2 to 3 times a day for breakthrough pain. Increase gabapentin 200-300 mg 2 times daily  Continue flexeril 5-10 mg nightly  Call us if you need a refill of any medications Follow up 3 days after MRI to discuss results

## 2022-10-01 DIAGNOSIS — E1043 Type 1 diabetes mellitus with diabetic autonomic (poly)neuropathy: Secondary | ICD-10-CM | POA: Diagnosis not present

## 2022-10-01 DIAGNOSIS — E109 Type 1 diabetes mellitus without complications: Secondary | ICD-10-CM | POA: Diagnosis not present

## 2022-10-01 DIAGNOSIS — E1065 Type 1 diabetes mellitus with hyperglycemia: Secondary | ICD-10-CM | POA: Diagnosis not present

## 2022-10-01 DIAGNOSIS — Z794 Long term (current) use of insulin: Secondary | ICD-10-CM | POA: Diagnosis not present

## 2022-10-05 ENCOUNTER — Other Ambulatory Visit: Payer: BC Managed Care – PPO

## 2022-10-12 ENCOUNTER — Ambulatory Visit (INDEPENDENT_AMBULATORY_CARE_PROVIDER_SITE_OTHER): Payer: BC Managed Care – PPO

## 2022-10-12 DIAGNOSIS — M542 Cervicalgia: Secondary | ICD-10-CM

## 2022-10-12 DIAGNOSIS — R29898 Other symptoms and signs involving the musculoskeletal system: Secondary | ICD-10-CM | POA: Diagnosis not present

## 2022-10-13 DIAGNOSIS — K3184 Gastroparesis: Secondary | ICD-10-CM | POA: Diagnosis not present

## 2022-10-13 DIAGNOSIS — Z9641 Presence of insulin pump (external) (internal): Secondary | ICD-10-CM | POA: Diagnosis not present

## 2022-10-13 DIAGNOSIS — E1065 Type 1 diabetes mellitus with hyperglycemia: Secondary | ICD-10-CM | POA: Diagnosis not present

## 2022-10-13 DIAGNOSIS — E1040 Type 1 diabetes mellitus with diabetic neuropathy, unspecified: Secondary | ICD-10-CM | POA: Diagnosis not present

## 2022-10-13 DIAGNOSIS — E1043 Type 1 diabetes mellitus with diabetic autonomic (poly)neuropathy: Secondary | ICD-10-CM | POA: Diagnosis not present

## 2022-10-17 ENCOUNTER — Ambulatory Visit (INDEPENDENT_AMBULATORY_CARE_PROVIDER_SITE_OTHER): Payer: BC Managed Care – PPO | Admitting: Sports Medicine

## 2022-10-17 VITALS — HR 66 | Ht 67.0 in | Wt 149.0 lb

## 2022-10-17 DIAGNOSIS — M542 Cervicalgia: Secondary | ICD-10-CM

## 2022-10-17 DIAGNOSIS — S46811A Strain of other muscles, fascia and tendons at shoulder and upper arm level, right arm, initial encounter: Secondary | ICD-10-CM

## 2022-10-17 DIAGNOSIS — S46811D Strain of other muscles, fascia and tendons at shoulder and upper arm level, right arm, subsequent encounter: Secondary | ICD-10-CM

## 2022-10-17 NOTE — Patient Instructions (Addendum)
Good to see you  Pt referral  4 week follow up   

## 2022-10-17 NOTE — Progress Notes (Signed)
Tiffany [name] D.[place] Phone: [phone]   Assessment and Plan:     1. Cervical spine pain 2. Neck pain 3. Trapezius strain, right, subsequent encounter  -Chronic with exacerbation, subsequent visit - [name], though pain is no longer radiating down into right arm.  Symptoms appear to be most consistent with right trapezius strain at this time - Patient has had only mild improvement with prednisone course, meloxicam course, gabapentin, Flexeril course.  She was not able to tolerate gabapentin or Flexeril due to sleepiness - MRI obtained and reviewed with patient in office today.  No significant changes that would explain patient's right-sided symptoms.  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  Referral sent today - Discussed trigger point injections today.  1 trigger point was performed, but patient could not handle anymore due to discomfort, so procedure was stopped  Trigger Point Injection: After informed consent was obtained, skin cleaned with alcohol  prep.  A total of 1 trigger points identified along right trapezius.  Injections given over area of pain for total injection of 1 ml of a mixture of 3 mL lidocaine 1% w/o epi and 1 mL Kenalog 40 mg/ML.  Patient had significant pain and was unable to tolerate any further injections after first injection, so procedure was stopped.  Pt given signs of infection to watch for.   Pertinent previous records reviewed include MRI C-spine 10/12/2022   Follow Up: 4 weeks for reevaluation   Subjective:   I, Tiffany [name], am serving as a Education administrator for Doctor Glennon Mac   Chief Complaint: neck , shoulder and upper trap    HPI:    09/05/2022 Tiffany [name] is a 52 y.o. female  coming in with complaint of right shoulder pain going on for about 3 days. Patient locates pain to right shoulder  between the shoulder blade and neck. Pain is shooting down her back and down the front of her arm has not noticed any numbness or tingling yet. Patient is very stiff while sitting and does not move the shoulder or turn her neck. Patient states she can move her lower arm around but not her shoulder.   Patient woke up 3 days ago and she had some pain not aware of any MOI   Last night the pain was getting worse and this morning the paine was unbearable     Duration- constant  Character- aching Aggravating factors- pressure, laying down, sensitive to the touch Reliving factors- none Therapies tried- tylenol, heat, Biofreeze and tiger balm   09/16/2022 Patient states that the pain has not gone away she has had 3 shots, is seeing her rheumatologist next Friday , she isn't able to sleep   10/17/2022 Patient states she is the same   Relevant Historical Information: DM type I, RA, Sjogren's  Additional pertinent review of systems negative.   Current Outpatient Medications:    albuterol (VENTOLIN HFA) 108 (90 Base) MCG/ACT inhaler, Inhale 1-2 puffs into the lungs every 6 (six) hours as needed for wheezing or shortness of breath., Disp: 1 each, Rfl: 0   aluminum chloride (DRYSOL) 20 % external solution, Apply topically every other day. For sweating. (Patient taking differently: Apply 1 application  topically every other day. For sweating.), Disp: 35 mL, Rfl:  1   APIDRA SOLOSTAR 100 UNIT/ML Solostar Pen, Inject into the skin., Disp: , Rfl:    atorvastatin (LIPITOR) 40 MG tablet, Take 1 tablet (40 mg total) by mouth every morning., Disp: 90 tablet, Rfl: 3   bisacodyl (DULCOLAX) 5 MG EC tablet, Take 5 mg by mouth daily as needed for mild constipation., Disp: , Rfl:    Continuous Blood Gluc Sensor (DEXCOM G6 SENSOR) MISC, Apply 1 sensor to the skin every 10 days for continuous glucose  monitoring., Disp: , Rfl:    Continuous Blood Gluc Transmit (DEXCOM G6 TRANSMITTER) MISC, Use as directed for continuous glucose monitoring. Reuse transmitter for 90 days then discard and replace., Disp: , Rfl:    fluticasone (FLOVENT HFA) 44 MCG/ACT inhaler, Inhale 2 puffs into the lungs 2 (two) times daily., Disp: 1 each, Rfl: 6   folic acid (FOLVITE) 1 MG tablet, Take 1 mg by mouth daily., Disp: , Rfl:    gabapentin (NEURONTIN) 100 MG capsule, Take 1 capsule (100 mg total) by mouth 2 (two) times daily., Disp: 60 capsule, Rfl: 0   glucagon 1 MG injection, Inject 1 mg into the skin once as needed (low blood sugar)., Disp: , Rfl:    insulin glargine (LANTUS SOLOSTAR) 100 UNIT/ML Solostar Pen, Inject 20 Units into the skin daily. If pump failure, take 20 units, Disp: , Rfl:    insulin glulisine (APIDRA) 100 UNIT/ML injection, Inject via insulin pump up to 60 units daily for T1DM, Disp: , Rfl:    linaclotide (LINZESS) 290 MCG CAPS capsule, Take 290 mcg by mouth daily. , Disp: , Rfl:    meloxicam (MOBIC) 15 MG tablet, Take 1 tablet (15 mg total) by mouth daily., Disp: 30 tablet, Rfl: 0   methotrexate (RHEUMATREX) 2.5 MG tablet, Take 15 mg by mouth once a week. Take 6 tablets (12.5 mg total) by mouth once a week, Disp: , Rfl:    Multiple Vitamin (MULTI-VITAMINS) TABS, Take 1 tablet by mouth daily., Disp: , Rfl:    nitrofurantoin, macrocrystal-monohydrate, (MACROBID) 100 MG capsule, Take 1 capsule (100 mg total) by mouth as needed (with sex)., Disp: 30 capsule, Rfl: 2   ONETOUCH ULTRA test strip, TEST 4 TO 6 TIMES DAILY AS DIRECTED, Disp: , Rfl:    pantoprazole (PROTONIX) 40 MG tablet, TAKE 1 TABLET BY MOUTH ONCE DAILY, Disp: 30 tablet, Rfl: 1   predniSONE (DELTASONE) 20 MG tablet, Prednisone 40 mg for 5 days, 20 mg for 5 days and 10 mg for 5 days, Disp: 18 tablet, Rfl: 0   SUMAtriptan (IMITREX) 100 MG tablet, Take 100 mg by mouth as needed for migraine., Disp: , Rfl:    Urine Glucose-Ketones Test  (KETO-DIASTIX) STRP, Use to check urine for ketones with high blood glucose, Disp: , Rfl:    Objective:     Vitals:   10/17/22 0903  Pulse: 66  SpO2: 100%  Weight: 149 lb (67.6 kg)  Height: 5\' 7"  (1.702 m)      Body mass index is 23.34 kg/m.    Physical Exam:    Neck Exam: Cervical Spine- Posture normal Skin- normal, intact  Neuro:  Strength-  Right Left   Deltoid (C5) 5/5 5/5  Bicep/Brachioradialis (C5/6) 5/5  5/5  Wrist Extension (C6) 5/5 5/5  Tricep (C7) 5/5 5/5  Wrist Flexion (C7) 5/5 5/5  Grip (C8) 5/5 5/5  Finger Abduction (T1) 5/5 5/5   Sensation: intact to light touch in upper extremities bilaterally  Spurling's:  negative bilaterally Neck ROM: Decreased  left sidebending due to right sided tension, decreased rotation, otherwise full active ROM TTP: Right cervical paraspinal, right trapezius, right levator, right thoracic paraspinal NTTP: Left-sided cervical spinous processes, cervical paraspinal, thoracic paraspinal, trapezius    Electronically signed by:  Tiffany [name] D.[name] 10:36 AM 10/17/22

## 2022-10-22 ENCOUNTER — Encounter: Payer: BC Managed Care – PPO | Admitting: Family Medicine

## 2022-10-28 ENCOUNTER — Other Ambulatory Visit: Payer: Self-pay | Admitting: Sports Medicine

## 2022-10-28 DIAGNOSIS — M542 Cervicalgia: Secondary | ICD-10-CM

## 2022-10-28 DIAGNOSIS — R29898 Other symptoms and signs involving the musculoskeletal system: Secondary | ICD-10-CM

## 2022-10-29 ENCOUNTER — Ambulatory Visit: Payer: BC Managed Care – PPO | Admitting: Hematology and Oncology

## 2022-11-03 ENCOUNTER — Inpatient Hospital Stay: Payer: BC Managed Care – PPO | Admitting: Hematology and Oncology

## 2022-11-03 ENCOUNTER — Other Ambulatory Visit: Payer: Self-pay

## 2022-11-03 VITALS — BP 122/58 | HR 100 | Temp 97.7°F | Resp 18 | Ht 67.0 in | Wt 148.5 lb

## 2022-11-03 NOTE — Assessment & Plan Note (Signed)
Mammogram 09/20/2020: Benign, breast density category C With family history of sister diagnosed with breast cancer at age 53, Tyrer-Cuzick estimating a lifetime risk of development of breast cancer 22.1% patient was referred to [place].   Risk assessment: Tyrer-Cuzick 10-year risk: 5.3% (average risk 2.7%) Tyrer-Cuzick lifetime risk: 20% (average risk 9%) Gail risk: 5-year risk: 1.9% (average risk 1.2) -------------------------------------------------------------------------------------------------------------------- Risk reduction: We discussed the role of tamoxifen and she decided not to take it.  Breast cancer surveillance: Breast exam 11/03/2022: Benign Mammograms alternating with breast MRIs

## 2022-11-06 ENCOUNTER — Ambulatory Visit: Payer: BC Managed Care – PPO | Attending: Sports Medicine | Admitting: Physical Therapy

## 2022-11-23 NOTE — Assessment & Plan Note (Deleted)
Mammogram 09/20/2020: Benign, breast density category C With family history of sister diagnosed with breast cancer at age 53, Tyrer-Cuzick estimating a lifetime risk of development of breast cancer 22.1% patient was referred to [place].   Risk assessment: Tyrer-Cuzick 10-year risk: 5.3% (average risk 2.7%) Tyrer-Cuzick lifetime risk: 20% (average risk 9%) Gail risk: 5-year risk: 1.9% (average risk 1.2)   Based on Gail risk score, she qualifies for adjuvant tamoxifen therapy.   Tamoxifen Toxicities:  Breast Cancer Surveillance: Breast Exam: 11/24/22: Benign Mammogram:  Breast MRI  RTC in 1 year

## 2022-11-24 ENCOUNTER — Inpatient Hospital Stay: Payer: BC Managed Care – PPO | Attending: Hematology and Oncology | Admitting: Hematology and Oncology

## 2022-11-24 ENCOUNTER — Telehealth: Payer: Self-pay | Admitting: Internal Medicine

## 2022-11-24 DIAGNOSIS — Z9189 Other specified personal risk factors, not elsewhere classified: Secondary | ICD-10-CM

## 2022-11-24 NOTE — Telephone Encounter (Signed)
Patient is requesting a transfer of care from [facility] requested records for review.

## 2022-12-02 ENCOUNTER — Encounter: Payer: Self-pay | Admitting: Physician Assistant

## 2022-12-02 ENCOUNTER — Ambulatory Visit (INDEPENDENT_AMBULATORY_CARE_PROVIDER_SITE_OTHER): Payer: BC Managed Care – PPO | Admitting: Physician Assistant

## 2022-12-02 VITALS — BP 118/70 | HR 92 | Ht 67.0 in | Wt 147.0 lb

## 2022-12-02 DIAGNOSIS — G8929 Other chronic pain: Secondary | ICD-10-CM

## 2022-12-02 DIAGNOSIS — M545 Low back pain, unspecified: Secondary | ICD-10-CM

## 2022-12-02 DIAGNOSIS — Z87440 Personal history of urinary (tract) infections: Secondary | ICD-10-CM | POA: Diagnosis not present

## 2022-12-02 DIAGNOSIS — N390 Urinary tract infection, site not specified: Secondary | ICD-10-CM

## 2022-12-02 LAB — URINALYSIS, COMPLETE
Bilirubin, UA: NEGATIVE
Ketones, UA: NEGATIVE
Leukocytes,UA: NEGATIVE
Nitrite, UA: NEGATIVE
Protein,UA: NEGATIVE
RBC, UA: NEGATIVE
Specific Gravity, UA: 1.02 (ref 1.005–1.030)
Urobilinogen, Ur: 0.2 mg/dL (ref 0.2–1.0)
pH, UA: 5.5 (ref 5.0–7.5)

## 2022-12-02 LAB — MICROSCOPIC EXAMINATION: Epithelial Cells (non renal): >10 /hpf — AB (ref 0–10)

## 2022-12-02 NOTE — Progress Notes (Signed)
12/02/2022 1:47 PM   Tiffany L Clear 03-21-1971 [id number]  CC: Chief Complaint  Patient presents with   Recurrent UTI   HPI: Tiffany [name] is a 52 y.o. female with PMH DM1, chronic constipation, and recurrent UTIs on postcoital prophylaxis who presents today for routine follow-up.   Today she reports ongoing right low back pain with bowel movements, also associated with vomiting.  She has been seeing GI for this and they referred her to mobility doctor at [facility].  She thinks she may have had a UTI since she last saw me in clinic 3 months ago.  She took a single dose of Cipro p.o. and her symptoms resolved.  In-office UA today positive for trace glucose; urine microscopy with >10 epithelial cells/hpf and moderate bacteria.Marland Kitchen  PMH: Past Medical History:  Diagnosis Date   Arthritis    knees, lower back right side   Connective tissue disease (HCC)    Depression    Diabetes mellitus    Diabetes mellitus type 1, uncontrolled, insulin dependent 04/03/2017   Fibromyalgia 12/02/2017   G6PD deficiency 10/26/2017   Gastroparesis    Headache(784.0)    Heart murmur    as a child - no problem as adult   History of kidney stones    no surgery   Hyperlipidemia    Lupus (HCC)    Migraines 06/05/2020   Rheumatoid arthritis (HCC) 06/05/2020   Sjogren's syndrome without extraglandular involvement (HCC) 02/24/2018    Surgical History: Past Surgical History:  Procedure Laterality Date   CESAREAN SECTION  1994, 2006   x 2   COLONOSCOPY WITH PROPOFOL N/A 03/16/2013   Procedure: COLONOSCOPY WITH PROPOFOL;  Surgeon: Willis Modena, MD;  Location: [facility];  Service: Endoscopy;  Laterality: N/A;   HYSTEROSCOPY WITH D & C N/A 07/11/2013   Procedure: DILATATION AND CURETTAGE /HYSTEROSCOPY;  Surgeon: Serita Kyle, MD;  Location: [facility];  Service: Gynecology;  Laterality: N/A;   left surgery shoulder     ROBOTIC ASSISTED LAPAROSCOPIC LYSIS OF ADHESION N/A 07/11/2013   Procedure: ROBOTIC  ASSISTED LAPAROSCOPIC LYSIS OF ADHESIONS ; REPAIR OF UTERINE DEHISENCE;  Surgeon: Serita Kyle, MD;  Location: [facility];  Service: Gynecology;  Laterality: N/A;   TUBAL LIGATION      Home Medications:  Allergies as of 12/02/2022       Reactions   Morphine Itching, Hives   Duloxetine Hcl    Other reaction(s): constipation   Gabapentin    Leg Pain   Morphine And Related Hives, Itching   Tramadol Hives, Itching        Medication List        Accurate as of Dec 02, 2022  1:47 PM. If you have any questions, ask your nurse or doctor.          albuterol 108 (90 Base) MCG/ACT inhaler Commonly known as: VENTOLIN HFA Inhale 1-2 puffs into the lungs every 6 (six) hours as needed for wheezing or shortness of breath.   aluminum chloride 20 % external solution Commonly known as: DRYSOL Apply topically every other day. For sweating. What changed: how much to take   Apidra 100 UNIT/ML injection Generic drug: insulin glulisine Inject via insulin pump up to 60 units daily for T1DM   Apidra SoloStar 100 UNIT/ML Solostar Pen Generic drug: insulin glulisine Inject into the skin.   atorvastatin 40 MG tablet Commonly known as: LIPITOR Take 1 tablet (40 mg total) by mouth every morning.   bisacodyl 5 MG EC  tablet Commonly known as: DULCOLAX Take 5 mg by mouth daily as needed for mild constipation.   Dexcom G6 Sensor Misc Apply 1 sensor to the skin every 10 days for continuous glucose monitoring.   Dexcom G6 Transmitter Misc Use as directed for continuous glucose monitoring. Reuse transmitter for 90 days then discard and replace.   fluticasone 44 MCG/ACT inhaler Commonly known as: Flovent HFA Inhale 2 puffs into the lungs 2 (two) times daily.   folic acid 1 MG tablet Commonly known as: FOLVITE Take 1 mg by mouth daily.   gabapentin 100 MG capsule Commonly known as: NEURONTIN Take 1 capsule (100 mg total) by mouth 2 (two) times daily.   glucagon 1 MG injection Inject  1 mg into the skin once as needed (low blood sugar).   Keto-Diastix Strp Use to check urine for ketones with high blood glucose   Lantus SoloStar 100 UNIT/ML Solostar Pen Generic drug: insulin glargine Inject 20 Units into the skin daily. If pump failure, take 20 units   linaclotide 290 MCG Caps capsule Commonly known as: LINZESS Take 290 mcg by mouth daily.   meloxicam 15 MG tablet Commonly known as: MOBIC Take 1 tablet (15 mg total) by mouth daily.   methotrexate 2.5 MG tablet Commonly known as: RHEUMATREX Take 15 mg by mouth once a week. Take 6 tablets (12.5 mg total) by mouth once a week   Multi-Vitamins Tabs Take 1 tablet by mouth daily.   nitrofurantoin (macrocrystal-monohydrate) 100 MG capsule Commonly known as: MACROBID Take 1 capsule (100 mg total) by mouth as needed (with sex).   OneTouch Ultra test strip Generic drug: glucose blood TEST 4 TO 6 TIMES DAILY AS DIRECTED   pantoprazole 40 MG tablet Commonly known as: PROTONIX TAKE 1 TABLET BY MOUTH ONCE DAILY   predniSONE 20 MG tablet Commonly known as: DELTASONE Prednisone 40 mg for 5 days, 20 mg for 5 days and 10 mg for 5 days   SUMAtriptan 100 MG tablet Commonly known as: IMITREX Take 100 mg by mouth as needed for migraine.        Allergies:  Allergies  Allergen Reactions   Morphine Itching and Hives   Duloxetine Hcl     Other reaction(s): constipation   Gabapentin     Leg Pain   Morphine And Related Hives and Itching   Tramadol Hives and Itching    Family History: Family History  Problem Relation Age of Onset   Lung cancer Mother        smoker   Uterine cancer Mother        ?Uterine cancer   Diabetes Sister    Arthritis Sister    Hyperlipidemia Sister    Alzheimer's disease Maternal Grandmother     Social History:   reports that she has never smoked. She has never used smokeless tobacco. She reports that she does not drink alcohol and does not use drugs.  Physical Exam: BP 118/70    Pulse 92   Ht 5\' 7"  (1.702 m)   Wt 147 lb (66.7 kg)   BMI 23.02 kg/m   Constitutional:  Alert and oriented, no acute distress, nontoxic appearing HEENT: [place], AT Cardiovascular: No clubbing, cyanosis, or edema Respiratory: Normal respiratory effort, no increased work of breathing Skin: No rashes, bruises or suspicious lesions Neurologic: Grossly intact, no focal deficits, moving all 4 extremities Psychiatric: Normal mood and affect  Laboratory Data: Results for orders placed or performed in visit on 12/02/22  Microscopic Examination  Urine  Result Value Ref Range   WBC, UA 0-5 0 - 5 /hpf   RBC, Urine 0-2 0 - 2 /hpf   Epithelial Cells (non renal) >10 (A) 0 - 10 /hpf   Bacteria, UA Moderate (A) None seen/Few  Urinalysis, Complete  Result Value Ref Range   Specific Gravity, UA 1.020 1.005 - 1.030   pH, UA 5.5 5.0 - 7.5   Color, UA Yellow Yellow   Appearance Ur Clear Clear   Leukocytes,UA Negative Negative   Protein,UA Negative Negative/Trace   Glucose, UA Trace (A) Negative   Ketones, UA Negative Negative   RBC, UA Negative Negative   Bilirubin, UA Negative Negative   Urobilinogen, Ur 0.2 0.2 - 1.0 mg/dL   Nitrite, UA Negative Negative   Microscopic Examination See below:    Assessment & Plan:   1. Recurrent UTI Possibly 1 breakthrough infection in the past 3 months, however her symptoms resolved after single dose of Cipro, so this is less likely.  Her UA today is bland, though contaminated.  She was previously noted to have microscopic hematuria in the setting of recent treatment for cystitis and menstruation but this has resolved today in the absence of other obvious contributing factors.  No further workup necessary for micro heme. - Urinalysis, Complete - US RENAL; Future  2. Chronic right-sided low back pain without sciatica Low suspicion for urologic source of her right low back pain, and this appears to be more associated with bowel movements, however with her  history of recurrent UTIs and diabetes I did offer her a renal ultrasound today and she agreed. - US RENAL; Future   Return in about 1 year (around 12/02/2023) for Annual follow-up, Will call with results.  Carman Ching, PA-C  [facility]  [address], [address] [place], [place] [address] [phone]

## 2022-12-12 DIAGNOSIS — E1043 Type 1 diabetes mellitus with diabetic autonomic (poly)neuropathy: Secondary | ICD-10-CM | POA: Diagnosis not present

## 2022-12-16 ENCOUNTER — Ambulatory Visit: Payer: BC Managed Care – PPO

## 2022-12-16 DIAGNOSIS — N959 Unspecified menopausal and perimenopausal disorder: Secondary | ICD-10-CM | POA: Diagnosis not present

## 2022-12-16 DIAGNOSIS — D259 Leiomyoma of uterus, unspecified: Secondary | ICD-10-CM | POA: Diagnosis not present

## 2022-12-17 ENCOUNTER — Ambulatory Visit: Admission: RE | Admit: 2022-12-17 | Payer: BC Managed Care – PPO | Source: Ambulatory Visit

## 2023-01-26 ENCOUNTER — Other Ambulatory Visit: Payer: Self-pay | Admitting: Physician Assistant

## 2023-01-26 ENCOUNTER — Ambulatory Visit
Admission: RE | Admit: 2023-01-26 | Discharge: 2023-01-26 | Disposition: A | Discharge status: Home / Self Care | Payer: BC Managed Care – PPO | Source: Ambulatory Visit | Attending: Physician Assistant | Admitting: Physician Assistant

## 2023-01-26 DIAGNOSIS — N289 Disorder of kidney and ureter, unspecified: Secondary | ICD-10-CM | POA: Diagnosis not present

## 2023-01-26 DIAGNOSIS — E1043 Type 1 diabetes mellitus with diabetic autonomic (poly)neuropathy: Secondary | ICD-10-CM | POA: Diagnosis not present

## 2023-01-26 DIAGNOSIS — K3184 Gastroparesis: Secondary | ICD-10-CM | POA: Diagnosis not present

## 2023-01-26 DIAGNOSIS — G8929 Other chronic pain: Secondary | ICD-10-CM | POA: Diagnosis not present

## 2023-01-26 DIAGNOSIS — N390 Urinary tract infection, site not specified: Secondary | ICD-10-CM | POA: Insufficient documentation

## 2023-01-26 DIAGNOSIS — E1065 Type 1 diabetes mellitus with hyperglycemia: Secondary | ICD-10-CM | POA: Diagnosis not present

## 2023-01-26 DIAGNOSIS — Z4681 Encounter for fitting and adjustment of insulin pump: Secondary | ICD-10-CM | POA: Diagnosis not present

## 2023-01-26 DIAGNOSIS — R8290 Unspecified abnormal findings in urine: Secondary | ICD-10-CM | POA: Diagnosis not present

## 2023-01-26 DIAGNOSIS — Z9641 Presence of insulin pump (external) (internal): Secondary | ICD-10-CM | POA: Diagnosis not present

## 2023-01-26 DIAGNOSIS — M545 Low back pain, unspecified: Secondary | ICD-10-CM | POA: Diagnosis not present

## 2023-02-16 DIAGNOSIS — E1065 Type 1 diabetes mellitus with hyperglycemia: Secondary | ICD-10-CM | POA: Diagnosis not present

## 2023-02-24 ENCOUNTER — Ambulatory Visit
Admission: RE | Admit: 2023-02-24 | Discharge: 2023-02-24 | Disposition: A | Discharge status: Home / Self Care | Payer: BC Managed Care – PPO | Source: Ambulatory Visit | Attending: Physician Assistant | Admitting: Physician Assistant

## 2023-02-24 DIAGNOSIS — N289 Disorder of kidney and ureter, unspecified: Secondary | ICD-10-CM | POA: Diagnosis not present

## 2023-02-24 DIAGNOSIS — N2889 Other specified disorders of kidney and ureter: Secondary | ICD-10-CM | POA: Diagnosis not present

## 2023-02-24 LAB — POCT I-STAT CREATININE: Creatinine, Ser: 0.7 mg/dL (ref 0.44–1.00)

## 2023-02-24 MED ORDER — IOHEXOL 300 MG/ML  SOLN
100.0000 mL | Freq: Once | INTRAMUSCULAR | Status: AC | PRN
  Administered 2023-02-24: 100 mL via INTRAVENOUS

## 2023-02-25 NOTE — Progress Notes (Signed)
This encounter was created in error - please disregard.

## 2023-02-26 ENCOUNTER — Telehealth: Payer: Self-pay | Admitting: Physician Assistant

## 2023-02-26 NOTE — Telephone Encounter (Signed)
Pt informed, voiced understanding

## 2023-02-26 NOTE — Telephone Encounter (Signed)
CT has not resulted yet, will reach out when available.

## 2023-02-26 NOTE — Telephone Encounter (Signed)
Patient called to ask about CT results from 02/24/23, and requests call.

## 2023-03-03 ENCOUNTER — Other Ambulatory Visit: Payer: Self-pay | Admitting: Physician Assistant

## 2023-03-03 DIAGNOSIS — N2889 Other specified disorders of kidney and ureter: Secondary | ICD-10-CM

## 2023-03-03 DIAGNOSIS — N289 Disorder of kidney and ureter, unspecified: Secondary | ICD-10-CM

## 2023-03-06 DIAGNOSIS — D509 Iron deficiency anemia, unspecified: Secondary | ICD-10-CM | POA: Diagnosis not present

## 2023-03-06 DIAGNOSIS — M0609 Rheumatoid arthritis without rheumatoid factor, multiple sites: Secondary | ICD-10-CM | POA: Diagnosis not present

## 2023-03-06 DIAGNOSIS — G5603 Carpal tunnel syndrome, bilateral upper limbs: Secondary | ICD-10-CM | POA: Diagnosis not present

## 2023-03-06 DIAGNOSIS — Z79899 Other long term (current) drug therapy: Secondary | ICD-10-CM | POA: Diagnosis not present

## 2023-03-06 DIAGNOSIS — M545 Low back pain, unspecified: Secondary | ICD-10-CM | POA: Diagnosis not present

## 2023-03-18 DIAGNOSIS — G5603 Carpal tunnel syndrome, bilateral upper limbs: Secondary | ICD-10-CM | POA: Diagnosis not present

## 2023-03-24 DIAGNOSIS — G5603 Carpal tunnel syndrome, bilateral upper limbs: Secondary | ICD-10-CM | POA: Diagnosis not present

## 2023-03-24 DIAGNOSIS — M65351 Trigger finger, right little finger: Secondary | ICD-10-CM | POA: Diagnosis not present

## 2023-03-24 DIAGNOSIS — M65342 Trigger finger, left ring finger: Secondary | ICD-10-CM | POA: Diagnosis not present

## 2023-04-01 DIAGNOSIS — E1043 Type 1 diabetes mellitus with diabetic autonomic (poly)neuropathy: Secondary | ICD-10-CM | POA: Diagnosis not present

## 2023-05-28 ENCOUNTER — Encounter: Payer: Self-pay | Admitting: Family Medicine

## 2023-05-28 ENCOUNTER — Ambulatory Visit (INDEPENDENT_AMBULATORY_CARE_PROVIDER_SITE_OTHER): Payer: BC Managed Care – PPO | Admitting: Family Medicine

## 2023-05-28 VITALS — BP 124/72 | HR 90 | Ht 67.0 in | Wt 153.2 lb

## 2023-05-28 DIAGNOSIS — D849 Immunodeficiency, unspecified: Secondary | ICD-10-CM | POA: Insufficient documentation

## 2023-05-28 DIAGNOSIS — H5712 Ocular pain, left eye: Secondary | ICD-10-CM | POA: Diagnosis not present

## 2023-05-28 DIAGNOSIS — Z8619 Personal history of other infectious and parasitic diseases: Secondary | ICD-10-CM | POA: Diagnosis not present

## 2023-05-28 MED ORDER — VALACYCLOVIR HCL 1 G PO TABS: 1000.0000 mg | ORAL_TABLET | Freq: Three times a day (TID) | ORAL | 0 refills | Status: DC

## 2023-05-28 NOTE — Progress Notes (Signed)
Patient ID: Tiffany [name], female    DOB: [birth year], 52 y.o.   MRN: [id number]  This visit was conducted in person.  BP 124/72   Pulse 90   Ht 5\' 7"  (1.702 m)   Wt 153 lb 4 oz (69.5 kg)   LMP  (Within Months)   SpO2 98%   BMI 24.00 kg/m    CC:  Chief Complaint  Patient presents with   Eye Pain    C/o L eye pain/pressure. H/o shingles in eye.     Subjective:   HPI: Tiffany [name] is a 52 y.o. female  patient of Mort Sawyers, NP with history presenting on 05/28/2023 for Eye Pain (C/o L eye pain/pressure. H/o shingles in eye. )  Hx of RA, immunocompromised on methotrexate.  She reports history of shingles in her left eye in past.   Yesterday started with new onset pressure behind eye.  Slightly blurry in left eye, pain with moving eye to right.  No eye redness, no rash. Last week felt some tingling in forehead, but on right, no left EYE was [facility], prior ophthalmologist at [facility].   No fever  Always has nasal ccngestion, no change.. using nasal spray.     Told cannot get the Shingrix vaccine.  Relevant past medical, surgical, family and social history reviewed and updated as indicated. Interim medical history since our last visit reviewed. Allergies and medications reviewed and updated. Outpatient Medications Prior to Visit  Medication Sig Dispense Refill   albuterol (VENTOLIN HFA) 108 (90 Base) MCG/ACT inhaler Inhale 1-2 puffs into the lungs every 6 (six) hours as needed for wheezing or shortness of breath. 1 each 0   aluminum chloride (DRYSOL) 20 % external solution Apply topically every other day. For sweating. (Patient taking differently: Apply 1 application  topically every other day. For sweating.) 35 mL 1   APIDRA SOLOSTAR 100 UNIT/ML Solostar Pen Inject into the skin.     atorvastatin (LIPITOR) 40 MG tablet Take 1 tablet (40 mg total) by mouth every morning. 90 tablet 3   bisacodyl (DULCOLAX) 5 MG EC tablet Take 5 mg by mouth daily as needed for  mild constipation.     Continuous Blood Gluc Sensor (DEXCOM G6 SENSOR) MISC Apply 1 sensor to the skin every 10 days for continuous glucose monitoring.     Continuous Blood Gluc Transmit (DEXCOM G6 TRANSMITTER) MISC Use as directed for continuous glucose monitoring. Reuse transmitter for 90 days then discard and replace.     fluticasone (FLOVENT HFA) 44 MCG/ACT inhaler Inhale 2 puffs into the lungs 2 (two) times daily. 1 each 6   folic acid (FOLVITE) 1 MG tablet Take 1 mg by mouth daily.     gabapentin (NEURONTIN) 100 MG capsule Take 1 capsule (100 mg total) by mouth 2 (two) times daily. 60 capsule 0   glucagon 1 MG injection Inject 1 mg into the skin once as needed (low blood sugar).     insulin glargine (LANTUS SOLOSTAR) 100 UNIT/ML Solostar Pen Inject 20 Units into the skin daily. If pump failure, take 20 units     insulin glulisine (APIDRA) 100 UNIT/ML injection Inject via insulin pump up to 60 units daily for T1DM     linaclotide (LINZESS) 290 MCG CAPS capsule Take 290 mcg by mouth daily.      meloxicam (MOBIC) 15 MG tablet Take 1 tablet (15 mg total) by mouth daily. 30 tablet 0   methotrexate (RHEUMATREX) 2.5 MG tablet  Take 15 mg by mouth once a week. Take 6 tablets (12.5 mg total) by mouth once a week     Multiple Vitamin (MULTI-VITAMINS) TABS Take 1 tablet by mouth daily.     nitrofurantoin, macrocrystal-monohydrate, (MACROBID) 100 MG capsule Take 1 capsule (100 mg total) by mouth as needed (with sex). 30 capsule 2   ONETOUCH ULTRA test strip TEST 4 TO 6 TIMES DAILY AS DIRECTED     pantoprazole (PROTONIX) 40 MG tablet TAKE 1 TABLET BY MOUTH ONCE DAILY 30 tablet 1   SUMAtriptan (IMITREX) 100 MG tablet Take 100 mg by mouth as needed for migraine.     Urine Glucose-Ketones Test (KETO-DIASTIX) STRP Use to check urine for ketones with high blood glucose     predniSONE (DELTASONE) 20 MG tablet Prednisone 40 mg for 5 days, 20 mg for 5 days and 10 mg for 5 days 18 tablet 0   No  facility-administered medications prior to visit.     Per HPI unless specifically indicated in ROS section below Review of Systems  Constitutional:  Negative for fatigue and fever.  HENT:  Negative for congestion.   Eyes:  Negative for pain.  Respiratory:  Negative for cough and shortness of breath.   Cardiovascular:  Negative for chest pain, palpitations and leg swelling.  Gastrointestinal:  Negative for abdominal pain.  Genitourinary:  Negative for dysuria and vaginal bleeding.  Musculoskeletal:  Negative for back pain.  Neurological:  Negative for syncope, light-headedness and headaches.  Psychiatric/Behavioral:  Negative for dysphoric mood.    Objective:  BP 124/72   Pulse 90   Ht 5\' 7"  (1.702 m)   Wt 153 lb 4 oz (69.5 kg)   LMP  (Within Months)   SpO2 98%   BMI 24.00 kg/m   Wt Readings from Last 3 Encounters:  05/28/23 153 lb 4 oz (69.5 kg)  12/02/22 147 lb (66.7 kg)  11/03/22 148 lb 8 oz (67.4 kg)      Physical Exam Constitutional:      General: She is not in acute distress.    Appearance: Normal appearance. She is well-developed. She is not ill-appearing or toxic-appearing.  HENT:     Head: Normocephalic.     Right Ear: Hearing, tympanic membrane, ear canal and external ear normal. Tympanic membrane is not erythematous, retracted or bulging.     Left Ear: Hearing, tympanic membrane, ear canal and external ear normal. Tympanic membrane is not erythematous, retracted or bulging.     Nose: No mucosal edema or rhinorrhea.     Right Sinus: No maxillary sinus tenderness or frontal sinus tenderness.     Left Sinus: No maxillary sinus tenderness or frontal sinus tenderness.     Mouth/Throat:     Mouth: Oropharynx is clear and moist and mucous membranes are normal.     Pharynx: Uvula midline.  Eyes:     General: Lids are normal. Lids are everted, no foreign bodies appreciated. Vision grossly intact. Gaze aligned appropriately. No allergic shiner or visual field deficit.        Right eye: No foreign body, discharge or hordeolum.        Left eye: No foreign body, discharge or hordeolum.     Extraocular Movements: EOM normal.     Right eye: Normal extraocular motion and no nystagmus.     Left eye: Normal extraocular motion and no nystagmus.     Conjunctiva/sclera: Conjunctivae normal.     Right eye: Right conjunctiva is not injected. No chemosis,  exudate or hemorrhage.    Left eye: Left conjunctiva is not injected. No chemosis, exudate or hemorrhage.    Pupils: Pupils are equal, round, and reactive to light.     Comments: Pain with left eye, looking right  Neck:     Thyroid: No thyroid mass or thyromegaly.     Vascular: No carotid bruit.     Trachea: Trachea normal.  Cardiovascular:     Rate and Rhythm: Normal rate and regular rhythm.     Pulses: Normal pulses.     Heart sounds: Normal heart sounds, S1 normal and S2 normal. No murmur heard.    No friction rub. No gallop.  Pulmonary:     Effort: Pulmonary effort is normal. No tachypnea or respiratory distress.     Breath sounds: Normal breath sounds. No decreased breath sounds, wheezing, rhonchi or rales.  Abdominal:     General: Bowel sounds are normal.     Palpations: Abdomen is soft.     Tenderness: There is no abdominal tenderness.  Musculoskeletal:     Cervical back: Normal range of motion and neck supple.  Skin:    General: Skin is warm, dry and intact.     Findings: No rash.  Neurological:     Mental Status: She is alert.  Psychiatric:        Mood and Affect: Mood is not anxious or depressed.        Speech: Speech normal.        Behavior: Behavior normal. Behavior is cooperative.        Thought Content: Thought content normal.        Cognition and Memory: Cognition and memory normal.        Judgment: Judgment normal.       Results for orders placed or performed during the hospital encounter of 02/24/23  I-STAT creatinine  Result Value Ref Range   Creatinine, Ser 0.70 0.44 - 1.00 mg/dL     Assessment and Plan  Left eye pain  History of herpes zoster of eye  Immunocompromised (HCC)  Other orders -     valACYclovir HCl; Take 1 tablet (1,000 mg total) by mouth 3 (three) times daily.  Dispense: 21 tablet; Refill: 0  No clear obvious alternative etiology of left eye pain.  No evidence of referred ear issue, throat issue.  Allergies well-controlled, no sign of sinus infection.  No lymphadenopathy. 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  Will start on valacyclovir 1000 mg 3 times daily for 7 days.  Recommended urgent ophthalmology exam with her ophthalmologist at [facility].  She will contact them to request for urgent appointment but if she has difficulty she will call for referral to alternative office to hopefully have her seen today or tomorrow.   No follow-ups on file.   Kerby Nora, MD

## 2023-06-01 DIAGNOSIS — G8929 Other chronic pain: Secondary | ICD-10-CM | POA: Diagnosis not present

## 2023-06-01 DIAGNOSIS — M545 Low back pain, unspecified: Secondary | ICD-10-CM | POA: Diagnosis not present

## 2023-06-16 DIAGNOSIS — E1065 Type 1 diabetes mellitus with hyperglycemia: Secondary | ICD-10-CM | POA: Diagnosis not present

## 2023-06-16 LAB — HEMOGLOBIN A1C: Hemoglobin A1C: 8.8

## 2023-06-18 DIAGNOSIS — M47816 Spondylosis without myelopathy or radiculopathy, lumbar region: Secondary | ICD-10-CM | POA: Diagnosis not present

## 2023-07-10 DIAGNOSIS — E1043 Type 1 diabetes mellitus with diabetic autonomic (poly)neuropathy: Secondary | ICD-10-CM | POA: Diagnosis not present

## 2023-07-13 DIAGNOSIS — Z1231 Encounter for screening mammogram for malignant neoplasm of breast: Secondary | ICD-10-CM | POA: Diagnosis not present

## 2023-07-15 ENCOUNTER — Ambulatory Visit: Payer: Self-pay | Admitting: Family

## 2023-07-15 NOTE — Telephone Encounter (Signed)
Spoke with pt to get more information. States that she has been dealing with lower back pain for the last 5 days. Pain is sharp and radiating into her hips, some times down to her knees. Reports that her pain is a 10/10. Pt did not seem to be in any kind of distress while speaking to her. I asked her about the request to be referred to a GI specialist; states that she wants to make sure her back pain doesn't have anything to do with her "colon." She has been told in the past that her back pain could be related to bowel issues or it could be arthritis. I have advised the pt that if she is in the amount of pain that she claims she is in, she needs to seek emergency care. Pt verbalized understanding but has declined at this time. States, "I will go if it gets to the point that I can't stand it."

## 2023-07-15 NOTE — Telephone Encounter (Signed)
  Chief Complaint: back pain Symptoms: low back pain Frequency: 5 days Pertinent Negatives: Patient denies denies GU s/s. Denies fever. Denies new bowel issues. Denies heavy lifting/new exercise.  Disposition: [x] ED /[] Urgent Care (no appt availability in office) / [x] Appointment(In office/virtual)/ []  [facility]/ [] Home Care/ [x] Refused Recommended Disposition /[] [facility]/ []  Follow-up with PCP  Additional Notes: Pt does not want to go to the ED despite 10/10 pain. Pt would like to see pcp to get a referral for a GI specialist. Pt advised to call back should s/s increase. Pt advised to go to ED, pt not agreeable-stating they will just tell her to get a specialist. Sched with PCP.  Copied from CRM [id number]. Topic: Clinical - Red Word Triage >> Jul 15, 2023 10:38 AM Elizebeth Brooking wrote: Red Word that prompted transfer to Nurse Triage: Patient stated she wants to come in to see Dr she is in a lot of pain Reason for Disposition  [1] SEVERE back pain (e.g., excruciating, unable to do any normal activities) AND [2] not improved 2 hours after pain medicine  Answer Assessment - Initial Assessment Questions 1. ONSET: "When did the pain begin?"      5 days ago 2. LOCATION: "Where does it hurt?" (upper, mid or lower back)     Lower back 3. SEVERITY: "How bad is the pain?"  (e.g., Scale 1-10; mild, moderate, or severe)   - MILD (1-3): Doesn't interfere with normal activities.    - MODERATE (4-7): Interferes with normal activities or awakens from sleep.    - SEVERE (8-10): Excruciating pain, unable to do any normal activities.      10 4. PATTERN: "Is the pain constant?" (e.g., yes, no; constant, intermittent)      Yes with movement 5. RADIATION: "Does the pain shoot into your legs or somewhere else?"     yes 6. CAUSE:  "What do you think is causing the back pain?"      I don't know if its from the arthritis or from the colon 7. BACK OVERUSE:  "Any recent lifting of heavy  objects, strenuous work or exercise?"     denies 8. MEDICINES: "What have you taken so far for the pain?" (e.g., nothing, acetaminophen, NSAIDS)     Last night took prednisone and tylenol, ointments 9. NEUROLOGIC SYMPTOMS: "Do you have any weakness, numbness, or problems with bowel/bladder control?"     States bowel control has been an issues for years 10. OTHER SYMPTOMS: "Do you have any other symptoms?" (e.g., fever, abdomen pain, burning with urination, blood in urine)       denies 11. PREGNANCY: "Is there any chance you are pregnant?" "When was your last menstrual period?"       denies  Protocols used: Back Pain-A-AH

## 2023-07-17 ENCOUNTER — Ambulatory Visit: Payer: BC Managed Care – PPO | Admitting: Family

## 2023-07-17 ENCOUNTER — Other Ambulatory Visit: Payer: Self-pay | Admitting: Family

## 2023-07-17 ENCOUNTER — Encounter: Payer: Self-pay | Admitting: Family

## 2023-07-17 VITALS — BP 132/88 | HR 100 | Temp 97.8°F | Ht 67.0 in | Wt 150.0 lb

## 2023-07-17 DIAGNOSIS — Z9189 Other specified personal risk factors, not elsewhere classified: Secondary | ICD-10-CM

## 2023-07-17 DIAGNOSIS — R7989 Other specified abnormal findings of blood chemistry: Secondary | ICD-10-CM

## 2023-07-17 DIAGNOSIS — F4329 Adjustment disorder with other symptoms: Secondary | ICD-10-CM | POA: Diagnosis not present

## 2023-07-17 DIAGNOSIS — R300 Dysuria: Secondary | ICD-10-CM | POA: Diagnosis not present

## 2023-07-17 DIAGNOSIS — E1143 Type 2 diabetes mellitus with diabetic autonomic (poly)neuropathy: Secondary | ICD-10-CM

## 2023-07-17 DIAGNOSIS — R232 Flushing: Secondary | ICD-10-CM

## 2023-07-17 DIAGNOSIS — K3184 Gastroparesis: Secondary | ICD-10-CM

## 2023-07-17 DIAGNOSIS — K1379 Other lesions of oral mucosa: Secondary | ICD-10-CM | POA: Diagnosis not present

## 2023-07-17 DIAGNOSIS — N390 Urinary tract infection, site not specified: Secondary | ICD-10-CM | POA: Diagnosis not present

## 2023-07-17 DIAGNOSIS — N898 Other specified noninflammatory disorders of vagina: Secondary | ICD-10-CM | POA: Diagnosis not present

## 2023-07-17 DIAGNOSIS — Z794 Long term (current) use of insulin: Secondary | ICD-10-CM

## 2023-07-17 DIAGNOSIS — R1084 Generalized abdominal pain: Secondary | ICD-10-CM

## 2023-07-17 DIAGNOSIS — K5904 Chronic idiopathic constipation: Secondary | ICD-10-CM

## 2023-07-17 DIAGNOSIS — D500 Iron deficiency anemia secondary to blood loss (chronic): Secondary | ICD-10-CM

## 2023-07-17 LAB — IBC + FERRITIN
Ferritin: 6.5 ng/mL — ABNORMAL LOW (ref 10.0–291.0)
Iron: 34 ug/dL — ABNORMAL LOW (ref 42–145)
Saturation Ratios: 5.8 % — ABNORMAL LOW (ref 20.0–50.0)
TIBC: 582.4 ug/dL — ABNORMAL HIGH (ref 250.0–450.0)
Transferrin: 416 mg/dL — ABNORMAL HIGH (ref 212.0–360.0)

## 2023-07-17 LAB — CBC WITH DIFFERENTIAL/PLATELET
Basophils Absolute: 0 10*3/uL (ref 0.0–0.1)
Basophils Relative: 0.5 % (ref 0.0–3.0)
Eosinophils Absolute: 0 10*3/uL (ref 0.0–0.7)
Eosinophils Relative: 0.4 % (ref 0.0–5.0)
HCT: 36.9 % (ref 36.0–46.0)
Hemoglobin: 11.5 g/dL — ABNORMAL LOW (ref 12.0–15.0)
Lymphocytes Relative: 16.2 % (ref 12.0–46.0)
Lymphs Abs: 1.5 10*3/uL (ref 0.7–4.0)
MCHC: 31 g/dL (ref 30.0–36.0)
MCV: 75.5 fL — ABNORMAL LOW (ref 78.0–100.0)
Monocytes Absolute: 0.5 10*3/uL (ref 0.1–1.0)
Monocytes Relative: 5.5 % (ref 3.0–12.0)
Neutro Abs: 7 10*3/uL (ref 1.4–7.7)
Neutrophils Relative %: 77.4 % — ABNORMAL HIGH (ref 43.0–77.0)
Platelets: 394 10*3/uL (ref 150.0–400.0)
RBC: 4.89 Mil/uL (ref 3.87–5.11)
RDW: 17.2 % — ABNORMAL HIGH (ref 11.5–15.5)
WBC: 9.1 10*3/uL (ref 4.0–10.5)

## 2023-07-17 LAB — POC URINALSYSI DIPSTICK (AUTOMATED)
Bilirubin, UA: NEGATIVE
Glucose, UA: POSITIVE — AB
Ketones, UA: NEGATIVE
Nitrite, UA: NEGATIVE
Protein, UA: POSITIVE — AB
Spec Grav, UA: 1.02 (ref 1.010–1.025)
Urobilinogen, UA: 0.2 U/dL
pH, UA: 6 (ref 5.0–8.0)

## 2023-07-17 LAB — VITAMIN B12: Vitamin B-12: 798 pg/mL (ref 211–911)

## 2023-07-17 LAB — TSH: TSH: 2.18 u[IU]/mL (ref 0.35–5.50)

## 2023-07-17 MED ORDER — LIDOCAINE VISCOUS HCL 2 % MT SOLN: 5.0000 mL | Freq: Four times a day (QID) | OROMUCOSAL | 0 refills | Status: DC

## 2023-07-17 MED ORDER — LINACLOTIDE 145 MCG PO CAPS
145.0000 ug | ORAL_CAPSULE | Freq: Every day | ORAL | 0 refills | Status: AC
Start: 2023-07-17 — End: ?

## 2023-07-17 NOTE — Telephone Encounter (Unsigned)
Copied from CRM [id number]. Topic: Clinical - Medication Refill >> Jul 17, 2023  3:36 PM Leavy Cella D wrote: Most Recent Primary Care Visit:  Provider: Kerby Nora E  Department: [facility]  Visit Type: OFFICE VISIT  Date: 05/28/2023  Medication: Cipro  Has the patient contacted their pharmacy? No (Agent: If no, request that the patient contact the pharmacy for the refill. If patient does not wish to contact the pharmacy document the reason why and proceed with request.) (Agent: If yes, when and what did the pharmacy advise?)  Is this the correct pharmacy for this prescription? Yes If no, delete pharmacy and type the correct one.  This is the patient's preferred pharmacy:  [facility] [phone] [place] - [address] [place] [address] Phone: [phone] Fax: [phone]     Has the prescription been filled recently? No  Is the patient out of the medication? Yes  Has the patient been seen for an appointment in the last year OR does the patient have an upcoming appointment? Yes  Can we respond through MyChart? Yes  Agent: Please be advised that Rx refills may take up to 3 business days. We ask that you follow-up with your pharmacy.

## 2023-07-17 NOTE — Progress Notes (Unsigned)
Established Patient Office Visit  Subjective:   Patient ID: Tiffany [name], female    DOB: [birth year]  Age: 52 y.o. MRN: [id number]  CC:  Chief Complaint  Patient presents with   Back Pain    HPI: Tiffany [name] is a 52 y.o. female presenting on 07/17/2023 for Back Pain  Back pain, states 'killing her now' she is seen by pain management as well as orthopedist, states they are not finding any reason for the back pain which is frustrating. Also sees rheumatologist.   UTI like symptoms, noticed to days ago with urinary odor. No urinary frequency and or urgency. A little bit of dysuria. But she is also on prednisone per her rheumatologist which also can affect her diabetes. Some thin white milky discharge this am vaginally as well. Denies flank pain, just back pain that is chronic and not new.   Mouth sores in her mouth, started last night. This is normal for her to flare up at times , she is on methotrexate and folic acid.   Chronic constipation, takes linzess 290 mcg once daily as well as dulcolax for this. she states at times when bowel is solid her back pain will be worse, but then when on the looser side the back pain is better. She had a colonoscopy 2023 per her record (we do not have outside records) she states it was 'normal' without any abn. She does state that she has gastroparesis. Abd pain at times, will cramp up 'alot'. Abd pain is very painful, worse after having a bowel movement as well.           ROS: Negative unless specifically indicated above in HPI.   Relevant past medical history reviewed and updated as indicated.   Allergies and medications reviewed and updated.   Current Outpatient Medications:    albuterol (VENTOLIN HFA) 108 (90 Base) MCG/ACT inhaler, Inhale 1-2 puffs into the lungs every 6 (six) hours as needed for wheezing or shortness of breath., Disp: 1 each, Rfl: 0   aluminum chloride (DRYSOL) 20 % external solution, Apply topically every other day.  For sweating. (Patient taking differently: Apply 1 application  topically every other day. For sweating.), Disp: 35 mL, Rfl: 1   APIDRA SOLOSTAR 100 UNIT/ML Solostar Pen, Inject into the skin., Disp: , Rfl:    atorvastatin (LIPITOR) 40 MG tablet, Take 1 tablet (40 mg total) by mouth every morning., Disp: 90 tablet, Rfl: 3   bisacodyl (DULCOLAX) 5 MG EC tablet, Take 5 mg by mouth daily as needed for mild constipation., Disp: , Rfl:    Continuous Blood Gluc Sensor (DEXCOM G6 SENSOR) MISC, Apply 1 sensor to the skin every 10 days for continuous glucose monitoring., Disp: , Rfl:    Continuous Blood Gluc Transmit (DEXCOM G6 TRANSMITTER) MISC, Use as directed for continuous glucose monitoring. Reuse transmitter for 90 days then discard and replace., Disp: , Rfl:    fluticasone (FLOVENT HFA) 44 MCG/ACT inhaler, Inhale 2 puffs into the lungs 2 (two) times daily., Disp: 1 each, Rfl: 6   folic acid (FOLVITE) 1 MG tablet, Take 1 mg by mouth daily., Disp: , Rfl:    gabapentin (NEURONTIN) 100 MG capsule, Take 1 capsule (100 mg total) by mouth 2 (two) times daily., Disp: 60 capsule, Rfl: 0   glucagon 1 MG injection, Inject 1 mg into the skin once as needed (low blood sugar)., Disp: , Rfl:    insulin glargine (LANTUS SOLOSTAR) 100 UNIT/ML Solostar Pen, Inject 20 Units  into the skin daily. If pump failure, take 20 units, Disp: , Rfl:    insulin glulisine (APIDRA) 100 UNIT/ML injection, Inject via insulin pump up to 60 units daily for T1DM, Disp: , Rfl:    linaclotide (LINZESS) 145 MCG CAPS capsule, Take 1 capsule (145 mcg total) by mouth daily before breakfast., Disp: 90 capsule, Rfl: 0   methotrexate (RHEUMATREX) 2.5 MG tablet, Take 15 mg by mouth once a week. Take 6 tablets (12.5 mg total) by mouth once a week, Disp: , Rfl:    Multiple Vitamin (MULTI-VITAMINS) TABS, Take 1 tablet by mouth daily., Disp: , Rfl:    nitrofurantoin, macrocrystal-monohydrate, (MACROBID) 100 MG capsule, Take 1 capsule (100 mg total) by  mouth as needed (with sex)., Disp: 30 capsule, Rfl: 2   ONETOUCH ULTRA test strip, TEST 4 TO 6 TIMES DAILY AS DIRECTED, Disp: , Rfl:    pantoprazole (PROTONIX) 40 MG tablet, TAKE 1 TABLET BY MOUTH ONCE DAILY, Disp: 30 tablet, Rfl: 1   SUMAtriptan (IMITREX) 100 MG tablet, Take 100 mg by mouth as needed for migraine., Disp: , Rfl:    Urine Glucose-Ketones Test (KETO-DIASTIX) STRP, Use to check urine for ketones with high blood glucose, Disp: , Rfl:    magic mouthwash (lidocaine, diphenhydrAMINE, alum & mag hydroxide) suspension, Swish and spit 5 mLs 4 (four) times daily for 5 days., Disp: 100 mL, Rfl: 0   sulfamethoxazole-trimethoprim (BACTRIM DS) 800-160 MG tablet, Take 1 tablet by mouth 2 (two) times daily. For urinary tract infection., Disp: 14 tablet, Rfl: 0  Allergies  Allergen Reactions   Morphine Itching and Hives   Duloxetine Hcl     Other reaction(s): constipation   Gabapentin     Leg Pain   Morphine And Codeine Hives and Itching   Tramadol Hives and Itching    Objective:   BP 132/88 (BP Location: Left Arm, Patient Position: Sitting, Cuff Size: Normal)   Pulse 100   Temp 97.8 F (36.6 C) (Temporal)   Ht 5\' 7"  (1.702 m)   Wt 150 lb (68 kg)   SpO2 100%   BMI 23.49 kg/m    Physical Exam Constitutional:      General: She is not in acute distress.    Appearance: Normal appearance. She is normal weight. She is not ill-appearing, toxic-appearing or diaphoretic.  HENT:     Head: Normocephalic.  Cardiovascular:     Rate and Rhythm: Normal rate.  Pulmonary:     Effort: Pulmonary effort is normal.  Abdominal:     Tenderness: There is abdominal tenderness in the right lower quadrant and left lower quadrant. There is no guarding or rebound.  Musculoskeletal:        General: Normal range of motion.  Neurological:     General: No focal deficit present.     Mental Status: She is alert and oriented to person, place, and time. Mental status is at baseline.  Psychiatric:         Mood and Affect: Mood normal.        Behavior: Behavior normal.        Thought Content: Thought content normal.        Judgment: Judgment normal.     Assessment & Plan:  Dysuria -     POCT Urinalysis Dipstick (Automated) -     Urinalysis w microscopic + reflex cultur  Gastroparesis due to DM [facility]) Assessment & Plan: Ongoing and worsening. Continue linzess, referral placed for Gi for further eval/treat workup  Orders: -     Ambulatory referral to Gastroenterology -     Celiac Pnl 2 rflx Endomysial Ab Ttr  Chronic idiopathic constipation Assessment & Plan: CIC. Refilled linzess 290 mcg once daily.  If pain ongoing, then will need to see GI.   Orders: -     Ambulatory referral to Gastroenterology -     linaCLOtide; Take 1 capsule (145 mcg total) by mouth daily before breakfast.  Dispense: 90 capsule; Refill: 0  At high risk for breast cancer Assessment & Plan: Has not been to her knowledge tested for BRCA and or other genetic mutations, she would like to have a referral. Referral placed for genetics counselor to evaluate further.   Orders: -     Ambulatory referral to Genetics  Stress and adjustment reaction -     TSH  Hot flashes -     TSH  Iron deficiency anemia due to chronic blood loss Assessment & Plan: Cbc ibc ferritin ordered pending results.   Orders: -     CBC with Differential/Platelet -     IBC + Ferritin; Future  High serum vitamin B12 -     Vitamin B12  Generalized abdominal pain Assessment & Plan: Ongoing. Ordering celiac panel pending results.  Did d/w pt monitoring diet, elimination diet trial.    Orders: -     Celiac Pnl 2 rflx Endomysial Ab Ttr  Vaginal discharge -     WET PREP BY MOLECULAR PROBE  Mouth sores Assessment & Plan: Sending in magic mouth wash RX to pharmacy    Recurrent UTI Assessment & Plan: Poct urine  Urine culture ordered pending results.  D/w pt red flag symptoms. Encouraged oral water hydration.  For slight  vaginal discharge will also order wet prep ddx bv vs yeast   Other orders -     Urine Culture -     REFLEXIVE URINE CULTURE     Follow up plan: Return in about 6 weeks (around 08/28/2023).  Mort Sawyers, FNP

## 2023-07-17 NOTE — Patient Instructions (Addendum)
  Try 100 mg nightly for gabapentin rather than twice daily due to drowsiness.   ------------------------------------  Trial without atorvastatin for about four days to see if any improvement.   ------------------------------------  Do some food trials for at least 14 days.   Dairy Gluten  Soy  Sugar   ------------------------------------  A referral was placed today to GI.  Please let us know if you have not heard back within 2 weeks about the referral.

## 2023-07-20 ENCOUNTER — Telehealth: Payer: Self-pay

## 2023-07-20 ENCOUNTER — Telehealth: Payer: Self-pay | Admitting: *Deleted

## 2023-07-20 DIAGNOSIS — M0609 Rheumatoid arthritis without rheumatoid factor, multiple sites: Secondary | ICD-10-CM | POA: Diagnosis not present

## 2023-07-20 DIAGNOSIS — Z79899 Other long term (current) drug therapy: Secondary | ICD-10-CM | POA: Diagnosis not present

## 2023-07-20 DIAGNOSIS — E1065 Type 1 diabetes mellitus with hyperglycemia: Secondary | ICD-10-CM | POA: Diagnosis not present

## 2023-07-20 DIAGNOSIS — K1379 Other lesions of oral mucosa: Secondary | ICD-10-CM

## 2023-07-20 LAB — URINALYSIS W MICROSCOPIC + REFLEX CULTURE
Bilirubin Urine: NEGATIVE
Hyaline Cast: NONE SEEN /LPF
Ketones, ur: NEGATIVE
Nitrites, Initial: NEGATIVE
Specific Gravity, Urine: 1.022 (ref 1.001–1.035)
WBC, UA: >=60 /[HPF] — AB (ref 0–5)
pH: 6.5 (ref 5.0–8.0)

## 2023-07-20 LAB — URINE CULTURE
MICRO NUMBER:: 15879749
SPECIMEN QUALITY:: ADEQUATE

## 2023-07-20 LAB — CULTURE INDICATED

## 2023-07-20 MED ORDER — SULFAMETHOXAZOLE-TRIMETHOPRIM 800-160 MG PO TABS: 1.0000 | ORAL_TABLET | Freq: Two times a day (BID) | ORAL | 0 refills | Status: DC

## 2023-07-20 MED ORDER — LIDOCAINE VISCOUS HCL 2 % MT SOLN: 5.0000 mL | Freq: Four times a day (QID) | OROMUCOSAL | 0 refills | Status: AC

## 2023-07-20 NOTE — Telephone Encounter (Signed)
Copied from CRM [id number]. Topic: Clinical - Prescription Issue >> Jul 17, 2023  4:40 PM Sonny Dandy B wrote: Reason for CRM: pt called to advise the provider the pharmacy does not have lidocaine, can not fill the prescription. Pt is requesting provider send  another medication to the pharmacy. Pt states she need this today. Pt also requesting  medication for  UTI. Ask if provider will urgently sent in order.

## 2023-07-20 NOTE — Telephone Encounter (Signed)
Per Wyatt Mage - send in Bactrium DS 800/160mg  BID x7 days.  Spoke with pt and she is aware that this medication has been sent in. States that [facility] for magic mouth wash. They advised her that they didn't have the medications to be able to "mix" it. [facility], was advised that [facility] not [facility]ounded [facility]. The closest location that does is [facility]. Advised pt of this and she is fine with [place] sending this prescription there. This has been taken care of.

## 2023-07-20 NOTE — Telephone Encounter (Signed)
Rx has been sent in for Bactrium per [name]. See other telephone encounter.

## 2023-07-20 NOTE — Telephone Encounter (Signed)
Noted  

## 2023-07-20 NOTE — Telephone Encounter (Signed)
Copied from CRM [id number]. Topic: Clinical - Medication Refill >> Jul 17, 2023  3:39 PM Leavy Cella D wrote: Most Recent Primary Care Visit:  Provider: Kerby Nora E  Department: [facility]  Visit Type: OFFICE VISIT  Date: 05/28/2023  Medication: Cipro  Has the patient contacted their pharmacy? No (Agent: If no, request that the patient contact the pharmacy for the refill. If patient does not wish to contact the pharmacy document the reason why and proceed with request.) (Agent: If yes, when and what did the pharmacy advise?)  Is this the correct pharmacy for this prescription? Yes If no, delete pharmacy and type the correct one.  This is the patient's preferred pharmacy:  [phone] [address] [place] [address] Phone: [phone] Fax: [phone]    Has the prescription been filled recently? No  Is the patient out of the medication? Yes  Has the patient been seen for an appointment in the last year OR does the patient have an upcoming appointment? Yes  Can we respond through MyChart? Yes  Agent: Please be advised that Rx refills may take up to 3 business days. We ask that you follow-up with your pharmacy.

## 2023-07-21 LAB — WET PREP BY MOLECULAR PROBE
Candida species: DETECTED — AB
MICRO NUMBER:: 15877095
SPECIMEN QUALITY:: ADEQUATE
Trichomonas vaginosis: NOT DETECTED

## 2023-07-22 DIAGNOSIS — K1379 Other lesions of oral mucosa: Secondary | ICD-10-CM | POA: Insufficient documentation

## 2023-07-22 NOTE — Assessment & Plan Note (Signed)
Ongoing. Ordering celiac panel pending results.  Did d/w pt monitoring diet, elimination diet trial.

## 2023-07-22 NOTE — Assessment & Plan Note (Signed)
Cbc ibc ferritin ordered pending results

## 2023-07-22 NOTE — Assessment & Plan Note (Signed)
Ongoing and worsening. Continue linzess, referral placed for Gi for further eval/treat workup

## 2023-07-22 NOTE — Assessment & Plan Note (Signed)
Sending in magic mouth wash RX to pharmacy

## 2023-07-22 NOTE — Assessment & Plan Note (Signed)
Poct urine  Urine culture ordered pending results.  D/w pt red flag symptoms. Encouraged oral water hydration.  For slight vaginal discharge will also order wet prep ddx bv vs yeast

## 2023-07-22 NOTE — Assessment & Plan Note (Signed)
CIC. Refilled linzess 290 mcg once daily.  If pain ongoing, then will need to see GI.

## 2023-07-22 NOTE — Assessment & Plan Note (Signed)
Has not been to her knowledge tested for BRCA and or other genetic mutations, she would like to have a referral. Referral placed for genetics counselor to evaluate further.

## 2023-07-23 ENCOUNTER — Other Ambulatory Visit: Payer: Self-pay | Admitting: Family

## 2023-07-23 DIAGNOSIS — B9689 Other specified bacterial agents as the cause of diseases classified elsewhere: Secondary | ICD-10-CM

## 2023-07-23 MED ORDER — METRONIDAZOLE 500 MG PO TABS: 500.0000 mg | ORAL_TABLET | Freq: Two times a day (BID) | ORAL | 0 refills | Status: AC

## 2023-07-24 ENCOUNTER — Ambulatory Visit: Payer: BC Managed Care – PPO | Admitting: Family

## 2023-07-27 LAB — CELIAC PNL 2 RFLX ENDOMYSIAL AB TTR
(tTG) Ab, IgA: <1 U/mL
(tTG) Ab, IgG: <1 U/mL
Endomysial Ab IgA: NEGATIVE
Gliadin IgA: 4.3 U/mL
Gliadin IgG: <1 U/mL
Immunoglobulin A: 324 mg/dL — ABNORMAL HIGH (ref 47–310)

## 2023-07-31 ENCOUNTER — Encounter: Payer: Self-pay | Admitting: Family

## 2023-07-31 DIAGNOSIS — N390 Urinary tract infection, site not specified: Secondary | ICD-10-CM

## 2023-08-02 NOTE — Telephone Encounter (Signed)
 Confirm she  completed the metronidazole, the bactrim and diflucan for yeast.   Let's have her come in and leave a repeat urine.  What symptoms exactly is she having when she says she is still having the UTI but not other symptoms?

## 2023-08-05 NOTE — Progress Notes (Signed)
 REFERRING PROVIDER: Corwin Antu, FNP [address] [place],  [place] [address]  PRIMARY PROVIDER:  Corwin Antu, FNP  PRIMARY REASON FOR VISIT:  Encounter Diagnoses  Name Primary?   Family history of breast cancer Yes   Family history of ovarian cancer     HISTORY OF PRESENT ILLNESS:   Tiffany [name], a 53 y.o. female, was seen for a [facility] cancer genetics consultation at the request of Antu Corwin, FNP due to a family history of breast and ovarian cancer.  Tiffany [name] presents to clinic today to discuss the possibility of a hereditary predisposition to cancer, to discuss genetic testing, and to further clarify her future cancer risks, as well as potential cancer risks for family members.   Tiffany [name] is a 53 y.o. female with no personal history of cancer.    CANCER HISTORY:  Oncology History   No history exists.    RISK FACTORS:  Mammogram within the last year: yes; category C density Breast MRIs: no Number of breast biopsies:  no . Colonoscopy: yes; most recent 2-3 years ago.  No polyps per patient.  Hysterectomy: no.  Ovaries intact: yes.  Menarche was at age 32.  First live birth at age 75.  Menopausal status: perimenopausal.  OCP use for approximately  6  years.  HRT use: 0 years. Dermatology screening: yes; as needed.   Past Medical History:  Diagnosis Date   Arthritis    knees, lower back right side   Connective tissue disease (HCC)    Depression    Diabetes mellitus    Diabetes mellitus type 1, uncontrolled, insulin  dependent 04/03/2017   Fibromyalgia 12/02/2017   G6PD deficiency 10/26/2017   Gastroparesis    Headache(784.0)    Heart murmur    as a child - no problem as adult   History of kidney stones    no surgery   Hyperlipidemia    Lupus    Migraines 06/05/2020   Rheumatoid arthritis (HCC) 06/05/2020   Sjogren's syndrome without extraglandular involvement (HCC) 02/24/2018    Past Surgical History:  Procedure Laterality Date   CESAREAN  SECTION  1994, 2006   x 2   COLONOSCOPY WITH PROPOFOL  N/A 03/16/2013   Procedure: COLONOSCOPY WITH PROPOFOL ;  Surgeon: Elsie Cree, MD;  Location: [facility];  Service: Endoscopy;  Laterality: N/A;   HYSTEROSCOPY WITH D & C N/A 07/11/2013   Procedure: DILATATION AND CURETTAGE /HYSTEROSCOPY;  Surgeon: Dickie DELENA Carder, MD;  Location: [facility];  Service: Gynecology;  Laterality: N/A;   left surgery shoulder     ROBOTIC ASSISTED LAPAROSCOPIC LYSIS OF ADHESION N/A 07/11/2013   Procedure: ROBOTIC ASSISTED LAPAROSCOPIC LYSIS OF ADHESIONS ; REPAIR OF UTERINE DEHISENCE;  Surgeon: Dickie DELENA Carder, MD;  Location: [facility];  Service: Gynecology;  Laterality: N/A;   TUBAL LIGATION         FAMILY HISTORY:  We obtained a detailed, 4-generation family history.  Significant diagnoses are listed below: Family History  Problem Relation Age of Onset   Lung cancer Mother        or other primary? mets d. 79   Ovarian cancer Mother        dx late 66s   Breast cancer Half-Sister 34       d. 62; mat half sister     Tiffany [name] is unaware of previous family history of genetic testing for hereditary cancer risks.  Other relatives are unavailable for genetic testing at this time.   She does  not have information about her paternal family.   There is no reported Ashkenazi Jewish ancestry. There is no known consanguinity.  GENETIC COUNSELING ASSESSMENT: Tiffany [name] is a 53 y.o. female with a family history of breast cancer which is somewhat suggestive of a hereditary cancer syndrome and predisposition to cancer given her sister's age of diagnosis. We, therefore, discussed and recommended the following at today's visit.   DISCUSSION: We discussed that 5 - 10% of cancer is hereditary, with most cases of hereditary breast cancer associated with mutations in BRCA1/2.  There are other genes that can be associated with hereditary breast, ovarian, or other cancer syndromes.  We discussed that testing is  beneficial for several reasons, including knowing about other cancer risks, identifying potential screening and risk-reduction options that may be appropriate, and understanding if other family members could be at risk for cancer and allowing them to undergo genetic testing.  We reviewed the characteristics, features and inheritance patterns of hereditary cancer syndromes. We also discussed genetic testing, including the appropriate family members to test, the process of testing, insurance coverage and turn-around-time for results. We discussed the implications of a negative, positive, and variant of uncertain significant result. We discussed that negative results would be uninformative given that Tiffany [name] does not have a personal history of cancer. We recommended Tiffany [name] pursue genetic testing for a panel that contains genes associated with breast, ovarian (epithelial and non-epithelial), and other cancers.  Tiffany [name] was offered a common hereditary cancer panel (~40 genes) and an expanded pan-cancer panel (~70 genes). Tiffany [name] was informed of the benefits and limitations of each panel, including that expanded pan-cancer panels contain several genes that do not have clear management guidelines at this point in time.  We also discussed that as the number of genes included on a panel increases, the chances of variants of uncertain significance increases.  After considering the benefits and limitations of each gene panel, Tiffany [name] elected to have an expanded psychologist, occupational through [employer]   The CancerNext-Expanded gene panel offered by [facility] and includes sequencing, rearrangement, and RNA analysis for the following 76 genes: AIP, ALK, APC, ATM, AXIN2, BAP1, BARD1, BMPR1A, BRCA1, BRCA2, BRIP1, CDC73, CDH1, CDK4, CDKN1B, CDKN2A, CEBPA, CHEK2, CTNNA1, DDX41, DICER1, ETV6, FH, FLCN, GATA2, LZTR1, MAX, MBD4, MEN1, MET, MLH1, MSH2, MSH3, MSH6, MUTYH, NF1, NF2, NTHL1, PALB2, PHOX2B, PMS2,  POT1, PRKAR1A, PTCH1, PTEN, RAD51C, RAD51D, RB1, RET, RUNX1, SDHA, SDHAF2, SDHB, SDHC, SDHD, SMAD4, SMARCA4, SMARCB1, SMARCE1, STK11, SUFU, TMEM127, TP53, TSC1, TSC2, VHL, and WT1 (sequencing and deletion/duplication); EGFR, HOXB13, KIT, MITF, PDGFRA, POLD1, and POLE (sequencing only); EPCAM and GREM1 (deletion/duplication only).    Based on Tiffany [name]'s family history of cancer of breast cancer in her sister before age 60, she meets NCCN criteria for genetic testing.  Other relatives are unavailable for genetic testing at this time.  If her out of pocket cost for testing is over $100, the laboratory should contact them to discuss self-pay options and/or patient pay assistance programs.   We discussed the Genetic Information Non-Discrimination Act (GINA) of 2008, which helps protect individuals against genetic discrimination based on their genetic test results.  It impacts both health insurance and employment.  With health insurance, it protects against genetic test results being used for increased premiums or policy termination. For employment, it protects against hiring, firing and promoting decisions based on genetic test results.  GINA does not apply to those in the [employer], those who work for companies with  less than 15 employees, and new life insurance or long-term disability medical illustrator.  Health status due to a cancer diagnosis is not protected under GINA.  PLAN: After considering the risks, benefits, and limitations, Tiffany [name] provided informed consent to pursue genetic testing and the blood sample was sent to [facility] for analysis of the CancerNext-Expanded +RNAinsight Panel. Results should be available within approximately 3 weeks' time, at which point they will be disclosed by telephone to Tiffany [name], as will any additional recommendations warranted by these results. Tiffany [name] will receive a summary of her genetic counseling visit and a copy of her results once available. This  information will also be available in Epic.   Tiffany [name] questions were answered to her satisfaction today. Our contact information was provided should additional questions or concerns arise. Thank you for the referral and allowing us  to share in the care of your patient.   Jazminn Pomales M. Nydia, MS, [facility] Genetic Counselor [email or web address] (P) [phone]    50 minutes were spent on the date of the encounter in service to the patient including preparation, face-to-face consultation, documentation and care coordination.  The patient was seen alone.  Drs. Iruku, Gudena and/or Lanny were available to discuss this case as needed.   _______________________________________________________________________ For Office Staff:  Number of people involved in session: 1 Was an Intern/ student involved with case: no

## 2023-08-06 ENCOUNTER — Inpatient Hospital Stay: Payer: BC Managed Care – PPO | Attending: Nurse Practitioner | Admitting: Genetic Counselor

## 2023-08-06 ENCOUNTER — Inpatient Hospital Stay: Payer: BC Managed Care – PPO

## 2023-08-06 ENCOUNTER — Encounter: Payer: Self-pay | Admitting: Genetic Counselor

## 2023-08-06 DIAGNOSIS — Z803 Family history of malignant neoplasm of breast: Secondary | ICD-10-CM | POA: Diagnosis not present

## 2023-08-06 DIAGNOSIS — Z8041 Family history of malignant neoplasm of ovary: Secondary | ICD-10-CM

## 2023-08-06 DIAGNOSIS — Z7183 Encounter for nonprocreative genetic counseling: Secondary | ICD-10-CM

## 2023-08-06 LAB — GENETIC SCREENING ORDER

## 2023-08-27 ENCOUNTER — Encounter: Payer: Self-pay | Admitting: Family

## 2023-08-31 ENCOUNTER — Ambulatory Visit: Payer: Self-pay | Admitting: Genetic Counselor

## 2023-08-31 DIAGNOSIS — Z1379 Encounter for other screening for genetic and chromosomal anomalies: Secondary | ICD-10-CM | POA: Insufficient documentation

## 2023-08-31 DIAGNOSIS — Z8041 Family history of malignant neoplasm of ovary: Secondary | ICD-10-CM

## 2023-08-31 DIAGNOSIS — Z803 Family history of malignant neoplasm of breast: Secondary | ICD-10-CM

## 2023-08-31 NOTE — Progress Notes (Addendum)
HPI:   Ms. Shipton was previously seen in the [facility] due to a family history of breast and ovarian cancer and concerns regarding a hereditary predisposition to cancer.    Ms. Stembridge recent genetic test results were disclosed to her by telephone. These results and recommendations are discussed in more detail below.  CANCER HISTORY:  Oncology History   No history exists.    FAMILY HISTORY:  We obtained a detailed, 4-generation family history.  Significant diagnoses are listed below: Family History  Problem Relation Age of Onset   Lung cancer Mother        or other primary? mets d. 47   Ovarian cancer Mother        dx late 28s   Diabetes Sister    Arthritis Sister    Hyperlipidemia Sister    Alzheimer's disease Maternal Grandmother    Breast cancer Half-Sister 45       d. 72; mat half sister       Ms. Youkhana is unaware of previous family history of genetic testing for hereditary cancer risks.  Other relatives are unavailable for genetic testing at this time.    She does not have information about her paternal family.    There is no reported Ashkenazi Jewish ancestry. There is no known consanguinity.  GENETIC TEST RESULTS:  The [facility] CancerNext-Expanded +RNAinsight Panel found no pathogenic mutations.   The CancerNext-Expanded gene panel offered by [facility] and includes sequencing, rearrangement, and RNA analysis for the following 76 genes: AIP, ALK, APC, ATM, AXIN2, BAP1, BARD1, BMPR1A, BRCA1, BRCA2, BRIP1, CDC73, CDH1, CDK4, CDKN1B, CDKN2A, CEBPA, CHEK2, CTNNA1, DDX41, DICER1, ETV6, FH, FLCN, GATA2, LZTR1, MAX, MBD4, MEN1, MET, MLH1, MSH2, MSH3, MSH6, MUTYH, NF1, NF2, NTHL1, PALB2, PHOX2B, PMS2, POT1, PRKAR1A, PTCH1, PTEN, RAD51C, RAD51D, RB1, RET, RUNX1, SDHA, SDHAF2, SDHB, SDHC, SDHD, SMAD4, SMARCA4, SMARCB1, SMARCE1, STK11, SUFU, TMEM127, TP53, TSC1, TSC2, VHL, and WT1 (sequencing and deletion/duplication); EGFR, HOXB13, KIT, MITF, PDGFRA,  POLD1, and POLE (sequencing only); EPCAM and GREM1 (deletion/duplication only).   The test report has been scanned into EPIC and is located under the Molecular Pathology section of the Results Review tab.  A portion of the result report is included below for reference. Genetic testing reported out on August 26, 2023.     Genetic testing did identify four variants of uncertain significance (VUS) - one in the ALK gene called  p.P835L (c.2504C>T), two in the [facility] gene called  p.E307V (c.920A>T) and  p.K309E (c.925A>G), and one in the MSH6 gene called  p.C1294F (c.3881G>T).  At this time, it is unknown if these variants are associated with increased cancer risk or if they are normal findings, but most variants such as these get reclassified to being inconsequential. They should not be used to make medical management decisions. With time, we suspect the lab will determine the significance of these variants, if any. If we do learn more about them, we will try to contact Ms. Sevillano53 to discuss it further. However, it is important to stay in touch with [place] periodically and keep the address and phone number up to date.  Even though a pathogenic variant was not identified, possible explanations for the cancer in the family may include: There may be no hereditary risk for cancer in the family. The cancers in Ms. Mendosa's family may be sporadic/familial or due to other genetic and environmental factors.  Most cancer is not hereditary.  There may be a gene mutation in one of  these genes that current testing methods cannot detect but that chance is small. There could be another gene that has not yet been discovered, or that we have not yet tested, that is responsible for the cancer diagnoses in the family.  It is also possible there is a hereditary cause for the cancer in the family that Ms. Goosby did not inherit.   Therefore, it is important to remain in touch with cancer genetics in the future so that we can  continue to offer Ms. Killilea the most up to date genetic testing.    ADDITIONAL GENETIC TESTING:   Ms. Torgeson genetic testing was fairly extensive.  If there are additional relevant genes identified to increase cancer risk that can be analyzed in the future, we would be happy to discuss and coordinate this testing at that time.     CANCER SCREENING RECOMMENDATIONS:  Ms. Demonte test result is considered negative (normal).  This means that we have not identified a hereditary cause for her family history of breast or ovarian at this time.   An individual's cancer risk and medical management are not determined by genetic test results alone. Overall cancer risk assessment incorporates additional factors, including personal medical history, family history, and any available genetic information that may result in a personalized plan for cancer prevention and surveillance. Therefore, it is recommended she continue to follow the cancer management and screening guidelines provided by her primary healthcare provider.  Her lifetime risk for breast cancer based on Tyrer-Cuzick risk model and reported personal/family history is 12.9%.  This is elevated compared to the general population but not in the 'high risk for breast cancer' category per NCCN guidelines (>20%).  This risk estimate can change over time and may be repeated to reflect new information in her personal or family history in the future.  She should continue to be diligent about annual breast imaging.     RECOMMENDATIONS FOR FAMILY MEMBERS:   Since she did not inherit a identifiable mutation in a cancer predisposition gene included on this panel, her children could not have inherited a known mutation from her in one of these genes. Individuals in this family might be at some increased risk of developing cancer, over the general population risk, due to the family history of cancer.  Individuals in the family should notify their providers of the  family history of cancer. We recommend women in this family have a yearly mammogram beginning at age 54, or 67 years younger than the earliest onset of cancer, an annual clinical breast exam, and perform monthly breast self-exams.  Risk models that take into account family history and hormonal history may be helpful in determining appropriate breast cancer screening options for family members.  Other members of the family may still carry a pathogenic variant in one of these genes that Ms. Lemire did not inherit. Based on the family history, we recommend first degree relatives of her mother, who had ovarian cancer, as well as first degree relatives of her half sister, who had breast cancer, have genetic counseling and testing at the appropriate time. Ms. Liberati can let us know if we can be of any assistance in coordinating genetic counseling and/or testing for these family members.   We do not recommend familial testing for the ALK, FLCN, or MSH6 variants of uncertain significance (VUS).  FOLLOW-UP:  Cancer genetics is a rapidly advancing field and it is possible that new genetic tests will be appropriate for her and/or her family members  in the future. We encourage Ms. Stthomas to remain in contact with cancer genetics, so we can update her personal and family histories and let her know of advances in cancer genetics that may benefit this family.   Our contact number was provided.  They are welcome to call us at anytime with additional questions or concerns.   Lita Flynn M. Rennie Plowman, MS, [facility] Genetic Counselor [email or web address] [phone]) [phone]

## 2023-10-02 ENCOUNTER — Telehealth: Payer: Self-pay | Admitting: Family

## 2023-10-02 NOTE — Telephone Encounter (Signed)
 Per our records, pt is due for her mammogram. LM for pt to return call.

## 2023-10-02 NOTE — Telephone Encounter (Signed)
 Copied from CRM [id number]. Topic: General - Other >> Oct 02, 2023  1:30 PM [place] E wrote: Reason for CRM: Patient called back after receiving a message from Dr. Fredric Dine team. Pulled are patient's notes and saw this was in reference to a mammogram. Relayed that message to the patient and she confirmed she did have a mammogram completed about 2 months ago through her gynecologist.

## 2023-10-02 NOTE — Telephone Encounter (Signed)
 Letter has been sent to the OBGYN we have on file for her to get a copy of her most recent mammogram.

## 2023-11-26 ENCOUNTER — Ambulatory Visit (INDEPENDENT_AMBULATORY_CARE_PROVIDER_SITE_OTHER): Admitting: Family

## 2023-11-26 ENCOUNTER — Ambulatory Visit: Payer: Self-pay

## 2023-11-26 VITALS — BP 130/82 | HR 101 | Temp 97.9°F | Wt 153.2 lb

## 2023-11-26 DIAGNOSIS — R81 Glycosuria: Secondary | ICD-10-CM

## 2023-11-26 DIAGNOSIS — R8290 Unspecified abnormal findings in urine: Secondary | ICD-10-CM

## 2023-11-26 DIAGNOSIS — R82998 Other abnormal findings in urine: Secondary | ICD-10-CM | POA: Insufficient documentation

## 2023-11-26 DIAGNOSIS — R350 Frequency of micturition: Secondary | ICD-10-CM | POA: Diagnosis not present

## 2023-11-26 DIAGNOSIS — R809 Proteinuria, unspecified: Secondary | ICD-10-CM | POA: Diagnosis not present

## 2023-11-26 LAB — POCT URINE DIPSTICK
Bilirubin, UA: NEGATIVE
Blood, UA: NEGATIVE
Glucose, UA: 100 mg/dL — AB
Ketones, POC UA: NEGATIVE mg/dL
Nitrite, UA: NEGATIVE
Spec Grav, UA: 1.015 (ref 1.010–1.025)
Urobilinogen, UA: 0.2 U/dL
pH, UA: 7 (ref 5.0–8.0)

## 2023-11-26 MED ORDER — CEPHALEXIN 500 MG PO CAPS: 500.0000 mg | ORAL_CAPSULE | Freq: Three times a day (TID) | ORAL | 0 refills | Status: AC

## 2023-11-26 NOTE — Assessment & Plan Note (Signed)
 Leuks in urine glucosuria, not on sglt2 concerning as pt with blurry vision, frequency and urinary odor however other sx consistent for DKA reportedly negatively.  Pt states she is going by endo office later, advised pt to call endo in regards to glucosuria and symptoms as they may need to intervene.  Discussed ER precautions.  Treating for potential UTI due to ROS and also poct dip sending for urine culture to confirm.  Rx cepahlexin tid x 7 days

## 2023-11-26 NOTE — Telephone Encounter (Signed)
 Saw patient already, thank you for speaking with the patient.  Please see note for further information.

## 2023-11-26 NOTE — Progress Notes (Signed)
 Established Patient Office Visit  Subjective:   Patient ID: Tiffany [name], female    DOB: May 24, 1971  Age: 53 y.o. MRN: [id number]  CC:  Chief Complaint  Patient presents with   Urinary Tract Infection    HPI: Tiffany [name] is a 53 y.o. female presenting on 11/26/2023 for Urinary Tract Infection   Thinks she might have another UTI.  Last night she had urinary odor and then again this am.  She thinks her sugars have been high and 'out of control' so thinks that might be contributing. She does note urinary frequency. No flank pain. Slight blurry vision because sugar has been but she states she lost her meter she is going to endo today to get a new meter, so she states this is contributing to her high sugars. She denies lethargy, fatigue, confusion, worse than normal abdominal pain and or excessive thirst. She is not taking Azo. Recent injections of steroids bil hands about three weeks ago which she thinks might have contributed to her sugar increase.   No fever or chills.  No vaginal discharge or itching.   Dm1: she is being followed by endocrinology. Her last A1c last month was 10.2  Last seen in 05/2023 with endo.   Constipation ongoing, being seen and evaluated currently by GI. Often bloated with lower abd tenderness. linzess  and dulcolax without much relief. She does also see hematology for IDA, she is taking a MVI iron but she struggles because she has constipation.       ROS: Negative unless specifically indicated above in HPI.   Relevant past medical history reviewed and updated as indicated.   Allergies and medications reviewed and updated.   Current Outpatient Medications:    albuterol  (VENTOLIN  HFA) 108 (90 Base) MCG/ACT inhaler, Inhale 1-2 puffs into the lungs every 6 (six) hours as needed for wheezing or shortness of breath., Disp: 1 each, Rfl: 0   aluminum  chloride (DRYSOL) 20 % external solution, Apply topically every other day. For sweating. (Patient taking  differently: Apply 1 application  topically every other day. For sweating.), Disp: 35 mL, Rfl: 1   APIDRA SOLOSTAR 100 UNIT/ML Solostar Pen, Inject into the skin., Disp: , Rfl:    atorvastatin  (LIPITOR) 40 MG tablet, Take 1 tablet (40 mg total) by mouth every morning., Disp: 90 tablet, Rfl: 3   bisacodyl  (DULCOLAX) 5 MG EC tablet, Take 5 mg by mouth daily as needed for mild constipation., Disp: , Rfl:    cephALEXin  (KEFLEX ) 500 MG capsule, Take 1 capsule (500 mg total) by mouth 3 (three) times daily for 7 days., Disp: 21 capsule, Rfl: 0   Continuous Blood Gluc Sensor (DEXCOM G6 SENSOR) MISC, Apply 1 sensor to the skin every 10 days for continuous glucose monitoring., Disp: , Rfl:    Continuous Blood Gluc Transmit (DEXCOM G6 TRANSMITTER) MISC, Use as directed for continuous glucose monitoring. Reuse transmitter for 90 days then discard and replace., Disp: , Rfl:    fluticasone  (FLOVENT  HFA) 44 MCG/ACT inhaler, Inhale 2 puffs into the lungs 2 (two) times daily., Disp: 1 each, Rfl: 6   folic acid  (FOLVITE ) 1 MG tablet, Take 1 mg by mouth daily., Disp: , Rfl:    gabapentin  (NEURONTIN ) 100 MG capsule, Take 1 capsule (100 mg total) by mouth 2 (two) times daily., Disp: 60 capsule, Rfl: 0   glucagon 1 MG injection, Inject 1 mg into the skin once as needed (low blood sugar)., Disp: , Rfl:    insulin  glargine (  LANTUS  SOLOSTAR) 100 UNIT/ML Solostar Pen, Inject 20 Units into the skin daily. If pump failure, take 20 units, Disp: , Rfl:    insulin  glulisine (APIDRA) 100 UNIT/ML injection, Inject via insulin  pump up to 60 units daily for T1DM, Disp: , Rfl:    linaclotide  (LINZESS ) 145 MCG CAPS capsule, Take 1 capsule (145 mcg total) by mouth daily before breakfast., Disp: 90 capsule, Rfl: 0   methotrexate (RHEUMATREX) 2.5 MG tablet, Take 15 mg by mouth once a week. Take 6 tablets (12.5 mg total) by mouth once a week, Disp: , Rfl:    Multiple Vitamin (MULTI-VITAMINS) TABS, Take 1 tablet by mouth daily., Disp: , Rfl:     nitrofurantoin , macrocrystal-monohydrate, (MACROBID ) 100 MG capsule, Take 1 capsule (100 mg total) by mouth as needed (with sex)., Disp: 30 capsule, Rfl: 2   ONETOUCH ULTRA test strip, TEST 4 TO 6 TIMES DAILY AS DIRECTED, Disp: , Rfl:    pantoprazole  (PROTONIX ) 40 MG tablet, TAKE 1 TABLET BY MOUTH ONCE DAILY, Disp: 30 tablet, Rfl: 1   sulfamethoxazole -trimethoprim  (BACTRIM  DS) 800-160 MG tablet, Take 1 tablet by mouth 2 (two) times daily. For urinary tract infection., Disp: 14 tablet, Rfl: 0   SUMAtriptan (IMITREX) 100 MG tablet, Take 100 mg by mouth as needed for migraine., Disp: , Rfl:    Urine Glucose-Ketones Test (KETO-DIASTIX) STRP, Use to check urine for ketones with high blood glucose, Disp: , Rfl:   Allergies  Allergen Reactions   Morphine  Itching and Hives   Duloxetine Hcl     Other reaction(s): constipation   Gabapentin      Leg Pain   Morphine  And Codeine Hives and Itching   Tramadol Hives and Itching    Objective:   BP 130/82 (BP Location: Left Arm, Patient Position: Sitting, Cuff Size: Normal)   Pulse (!) 101   Temp 97.9 F (36.6 C) (Temporal)   Wt 153 lb 3.2 oz (69.5 kg)   SpO2 98%   BMI 23.99 kg/m    Physical Exam  Assessment & Plan:  Urinary frequency -     POCT URINE DIPSTICK  Bad odor of urine -     POCT URINE DIPSTICK  Proteinuria, unspecified type -     Urinalysis w microscopic + reflex cultur  Leukocytes in urine Assessment & Plan: Leuks in urine glucosuria, not on sglt2 concerning as pt with blurry vision, frequency and urinary odor however other sx consistent for DKA reportedly negatively.  Pt states she is going by endo office later, advised pt to call endo in regards to glucosuria and symptoms as they may need to intervene.  Discussed ER precautions.  Treating for potential UTI due to ROS and also poct dip sending for urine culture to confirm.  Rx cepahlexin tid x 7 days   Orders: -     Urinalysis w microscopic + reflex cultur -      Cephalexin ; Take 1 capsule (500 mg total) by mouth 3 (three) times daily for 7 days.  Dispense: 21 capsule; Refill: 0  Glucosuria     Follow up plan: Return if symptoms worsen or fail to improve.  Felicita Horns, FNP

## 2023-11-26 NOTE — Telephone Encounter (Signed)
 Copied from CRM [id number]. Topic: Clinical - Red Word Triage >> Nov 26, 2023  8:33 AM [place] E wrote: [facility] that prompted transfer to Nurse Triage: Possible UTI with stomach pain. Patient started having pain in her abdomen last night and noticed an odor when urinating. Patient rated the pain a level 6 out of 10. Symptoms have been going on for a day.  Chief Complaint: UTI symptoms Symptoms: Urinary odor, frequency, urgency, abdominal pain Frequency: Since last night Pertinent Negatives: Patient denies fever Disposition: [] ED /[] Urgent Care (no appt availability in office) / [x] Appointment(In office/virtual)/ []  [place] [facility]/ [] Home Care/ [] Refused Recommended Disposition /[] [place] [facility]/ []  Follow-up with PCP Additional Notes: Patient called in to report urinary symptoms that have been ongoing since last night. Patient reported urinary odor, frequency, urgency and abdominal pain. Patient denied fever and hematuria. Advised patient to be seen within 24 hours, per protocol. Scheduled with PCP. Provided care advice and instructed patient to call back if symptoms worsen. Patient complied.   Reason for Disposition  Urinating more frequently than usual (i.e., frequency)  Answer Assessment - Initial Assessment Questions 1. SYMPTOM: "What's the main symptom you're concerned about?" (e.g., frequency, incontinence)     Urinary odor 2. ONSET: "When did the symptoms start?"     Last night 3. PAIN: "Is there any pain?" If Yes, ask: "How bad is it?" (Scale: 1-10; mild, moderate, severe)     Rates abdominal pain about a 6 4. CAUSE: "What do you think is causing the symptoms?"     UTI 5. OTHER SYMPTOMS: "Do you have any other symptoms?" (e.g., blood in urine, fever, flank pain, pain with urination)     Urinary urge and frequency Denies burning while urinating, denies fever, denies hematuria  Protocols used: Urinary Symptoms-A-AH

## 2023-11-27 ENCOUNTER — Encounter: Payer: Self-pay | Admitting: Family

## 2023-11-27 LAB — URINALYSIS W MICROSCOPIC + REFLEX CULTURE
Bilirubin Urine: NEGATIVE
Hgb urine dipstick: NEGATIVE
Hyaline Cast: NONE SEEN /LPF
Ketones, ur: NEGATIVE
Leukocyte Esterase: NEGATIVE
Nitrites, Initial: NEGATIVE
Protein, ur: NEGATIVE
RBC / HPF: NONE SEEN /HPF (ref 0–2)
Specific Gravity, Urine: 1.02 (ref 1.001–1.035)
WBC, UA: NONE SEEN /HPF (ref 0–5)
pH: 7 (ref 5.0–8.0)

## 2023-11-27 LAB — NO CULTURE INDICATED

## 2023-12-03 ENCOUNTER — Ambulatory Visit (INDEPENDENT_AMBULATORY_CARE_PROVIDER_SITE_OTHER): Payer: Self-pay | Admitting: Physician Assistant

## 2023-12-03 VITALS — BP 130/75 | HR 96 | Ht 67.0 in | Wt 157.2 lb

## 2023-12-03 DIAGNOSIS — N390 Urinary tract infection, site not specified: Secondary | ICD-10-CM

## 2023-12-03 DIAGNOSIS — N289 Disorder of kidney and ureter, unspecified: Secondary | ICD-10-CM | POA: Diagnosis not present

## 2023-12-03 MED ORDER — NITROFURANTOIN MONOHYD MACRO 100 MG PO CAPS: 100.0000 mg | ORAL_CAPSULE | Freq: Every day | ORAL | 11 refills | Status: DC

## 2023-12-03 NOTE — Progress Notes (Signed)
 12/03/2023 9:13 AM   Tiffany [name] 03-20-1971 [id number]  CC: Chief Complaint  Patient presents with   Follow-up   HPI: Tiffany [name] is a 53 y.o. female with PMH DM1, chronic constipation, recurrent UTIs on postcoital prophylaxis, and small indeterminate left renal lesion who presents today for annual follow-up.  She is accompanied today by her husband, who contributes to HPI.  Today she reports she continues to have UTIs every 2 to 3 months despite postcoital Macrobid , which she attributes to recent hyperglycemia and ongoing alternating constipation and diarrhea.  Her most recent A1c was 10.2.  Per chart review, she had a positive urine culture on 07/05/2023, which grew pansensitive E. coli.  PMH: Past Medical History:  Diagnosis Date   Arthritis    knees, lower back right side   Connective tissue disease (HCC)    Depression    Diabetes mellitus    Diabetes mellitus type 1, uncontrolled, insulin  dependent 04/03/2017   Fibromyalgia 12/02/2017   G6PD deficiency 10/26/2017   Gastroparesis    Headache(784.0)    Heart murmur    as a child - no problem as adult   History of kidney stones    no surgery   Hyperlipidemia    Lupus    Migraines 06/05/2020   Rheumatoid arthritis (HCC) 06/05/2020   Sjogren's syndrome without extraglandular involvement (HCC) 02/24/2018    Surgical History: Past Surgical History:  Procedure Laterality Date   CESAREAN SECTION  1994, 2006   x 2   COLONOSCOPY WITH PROPOFOL  N/A 03/16/2013   Procedure: COLONOSCOPY WITH PROPOFOL ;  Surgeon: Evangeline Hilts, MD;  Location: [facility];  Service: Endoscopy;  Laterality: N/A;   HYSTEROSCOPY WITH D & C N/A 07/11/2013   Procedure: DILATATION AND CURETTAGE /HYSTEROSCOPY;  Surgeon: Kandra Orn, MD;  Location: [facility];  Service: Gynecology;  Laterality: N/A;   left surgery shoulder     ROBOTIC ASSISTED LAPAROSCOPIC LYSIS OF ADHESION N/A 07/11/2013   Procedure: ROBOTIC ASSISTED LAPAROSCOPIC LYSIS OF  ADHESIONS ; REPAIR OF UTERINE DEHISENCE;  Surgeon: Kandra Orn, MD;  Location: [facility];  Service: Gynecology;  Laterality: N/A;   TUBAL LIGATION      Home Medications:  Allergies as of 12/03/2023       Reactions   Morphine  Itching, Hives   Duloxetine Hcl    Other reaction(s): constipation   Gabapentin     Leg Pain   Morphine  And Codeine Hives, Itching   Tramadol Hives, Itching        Medication List        Accurate as of Dec 03, 2023  9:13 AM. If you have any questions, ask your nurse or doctor.          albuterol  108 (90 Base) MCG/ACT inhaler Commonly known as: VENTOLIN  HFA Inhale 1-2 puffs into the lungs every 6 (six) hours as needed for wheezing or shortness of breath.   aluminum  chloride 20 % external solution Commonly known as: DRYSOL Apply topically every other day. For sweating. What changed: how much to take   Apidra 100 UNIT/ML injection Generic drug: insulin  glulisine Inject via insulin  pump up to 60 units daily for T1DM   Apidra SoloStar 100 UNIT/ML Solostar Pen Generic drug: insulin  glulisine Inject into the skin.   atorvastatin  40 MG tablet Commonly known as: LIPITOR Take 1 tablet (40 mg total) by mouth every morning.   bisacodyl  5 MG EC tablet Commonly known as: DULCOLAX Take 5 mg by mouth daily as needed for mild  constipation.   cephALEXin  500 MG capsule Commonly known as: KEFLEX  Take 1 capsule (500 mg total) by mouth 3 (three) times daily for 7 days.   Dexcom G6 Sensor Misc Apply 1 sensor to the skin every 10 days for continuous glucose monitoring.   Dexcom G6 Transmitter Misc Use as directed for continuous glucose monitoring. Reuse transmitter for 90 days then discard and replace.   fluticasone  44 MCG/ACT inhaler Commonly known as: Flovent  HFA Inhale 2 puffs into the lungs 2 (two) times daily.   folic acid  1 MG tablet Commonly known as: FOLVITE  Take 1 mg by mouth daily.   gabapentin  100 MG capsule Commonly known as:  NEURONTIN  Take 1 capsule (100 mg total) by mouth 2 (two) times daily.   glucagon 1 MG injection Inject 1 mg into the skin once as needed (low blood sugar).   Keto-Diastix Strp Use to check urine for ketones with high blood glucose   Lantus  SoloStar 100 UNIT/ML Solostar Pen Generic drug: insulin  glargine Inject 20 Units into the skin daily. If pump failure, take 20 units   linaclotide  145 MCG Caps capsule Commonly known as: Linzess  Take 1 capsule (145 mcg total) by mouth daily before breakfast.   methotrexate 2.5 MG tablet Commonly known as: RHEUMATREX Take 15 mg by mouth once a week. Take 6 tablets (12.5 mg total) by mouth once a week   Multi-Vitamins Tabs Take 1 tablet by mouth daily.   nitrofurantoin  (macrocrystal-monohydrate) 100 MG capsule Commonly known as: MACROBID  Take 1 capsule (100 mg total) by mouth as needed (with sex).   OneTouch Ultra test strip Generic drug: glucose blood TEST 4 TO 6 TIMES DAILY AS DIRECTED   pantoprazole  40 MG tablet Commonly known as: PROTONIX  TAKE 1 TABLET BY MOUTH ONCE DAILY   sulfamethoxazole -trimethoprim  800-160 MG tablet Commonly known as: BACTRIM  DS Take 1 tablet by mouth 2 (two) times daily. For urinary tract infection.   SUMAtriptan 100 MG tablet Commonly known as: IMITREX Take 100 mg by mouth as needed for migraine.        Allergies:  Allergies  Allergen Reactions   Morphine  Itching and Hives   Duloxetine Hcl     Other reaction(s): constipation   Gabapentin      Leg Pain   Morphine  And Codeine Hives and Itching   Tramadol Hives and Itching    Family History: Family History  Problem Relation Age of Onset   Lung cancer Mother        or other primary? mets d. 57   Ovarian cancer Mother        dx late 59s   Diabetes Sister    Arthritis Sister    Hyperlipidemia Sister    Alzheimer's disease Maternal Grandmother    Breast cancer Half-Sister 45       d. 21; mat half sister    Social History:   reports that  she has never smoked. She has never used smokeless tobacco. She reports that she does not drink alcohol  and does not use drugs.  Physical Exam: BP 130/75   Pulse 96   Ht 5\' 7"  (1.702 m)   Wt 157 lb 3.2 oz (71.3 kg)   BMI 24.62 kg/m   Constitutional:  Alert and oriented, no acute distress, nontoxic appearing HEENT: [place], AT Cardiovascular: No clubbing, cyanosis, or edema Respiratory: Normal respiratory effort, no increased work of breathing Skin: No rashes, bruises or suspicious lesions Neurologic: Grossly intact, no focal deficits, moving all 4 extremities Psychiatric: Normal mood and affect  Assessment & Plan:   1. Recurrent UTI (Primary) She reports breakthrough UTIs every 2 to 3 months despite postcoital Macrobid , and I see 1 positive urine culture from the last year.  Using shared decision making, we elected to increase her antibiotic suppression to once daily.  She understands that her blood sugars and bowel issues are contributory. - nitrofurantoin , macrocrystal-monohydrate, (MACROBID ) 100 MG capsule; Take 1 capsule (100 mg total) by mouth daily.  Dispense: 30 capsule; Refill: 11  2. Lesion of left native kidney MR abdomen with and without contrast for further evaluation has been ordered, I gave her the phone number for scheduling and we will contact her with results when available.  Return in about 1 year (around 12/02/2024) for Annual f/u, will call with results.  Kathreen Pare, PA-C  [facility] [address] [place], [place] [address] [phone]

## 2023-12-03 NOTE — Patient Instructions (Signed)
 Please call the imaging department to schedule your MRI: [phone]

## 2024-02-29 ENCOUNTER — Ambulatory Visit: Payer: Self-pay | Admitting: Physician Assistant

## 2024-03-15 ENCOUNTER — Other Ambulatory Visit: Payer: Self-pay

## 2024-03-15 DIAGNOSIS — D49512 Neoplasm of unspecified behavior of left kidney: Secondary | ICD-10-CM

## 2024-03-29 ENCOUNTER — Ambulatory Visit (HOSPITAL_BASED_OUTPATIENT_CLINIC_OR_DEPARTMENT_OTHER): Admitting: Physician Assistant

## 2024-03-29 ENCOUNTER — Encounter (HOSPITAL_BASED_OUTPATIENT_CLINIC_OR_DEPARTMENT_OTHER): Payer: Self-pay | Admitting: Physician Assistant

## 2024-03-29 DIAGNOSIS — M79641 Pain in right hand: Secondary | ICD-10-CM | POA: Diagnosis not present

## 2024-03-29 DIAGNOSIS — M79642 Pain in left hand: Secondary | ICD-10-CM

## 2024-03-29 NOTE — Progress Notes (Signed)
 Office Visit Note   Patient: Tiffany [name]           Date of Birth: August 22, 1970           MRN: [id number] Visit Date: 03/29/2024              Requested by: Corwin Antu, FNP [address] [place],  [place] [address] PCP: Corwin Antu, FNP   Assessment & Plan: Visit Diagnoses:  1. Bilateral hand pain     Plan: Patient is a 53 year old woman with a history of uncontrolled diabetes.  She has been followed by [facility] for her hands.  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  She was to undergo surgery as as she has had injections and they did not last as long as she would like.  She has had no new injury but continues to have pain.  She was unable to get surgery because her A1c was over 10.  Unfortunately her most recent A1c is still over 8.  I have recommended even though she is not ready to do surgery that she should follow-up as she is planning to have having surgery with the hand surgeons at [facility].  Do not see any red flags she also may have a component of neuropathy.  No evidence of infection or vascular compromise  Follow-Up Instructions: No follow-ups on file.   Orders:  No orders of the defined types were placed in this encounter.  No orders of the defined types were placed in this encounter.     Procedures: No procedures performed   Clinical Data: No additional findings.   Subjective: Chief Complaint  Patient presents with   Left Hand - Pain   Right Hand - Pain    HPI pleasant 53 year old woman comes in with bilateral hand pain no particular injury has a history of these issues and has been seen by [facility] who follows her for her hands.  Comes in today because she had some numbness in her fingers last night and her hands continue to bother her.  She has had trigger fingers injections in the past wonder if that is a possibility  Review of Systems  All other systems reviewed and are negative.    Objective: Vital Signs: There were no vitals  taken for this visit.  Physical Exam Constitutional:      Appearance: Normal appearance.  Pulmonary:     Effort: Pulmonary effort is normal.     Breath sounds: Normal breath sounds.  Skin:    General: Skin is warm and dry.  Neurological:     General: No focal deficit present.     Mental Status: She is alert and oriented to person, place, and time.  Psychiatric:        Mood and Affect: Mood normal.        Behavior: Behavior normal.     Ortho Exam Bilateral hands no redness no erythema no swelling she has triggering of the small finger as well as the ring finger on opposite hands.  She grip strength is intact she has brisk capillary refill less than 10 seconds and pulses are intact. Specialty Comments:  No specialty comments available.  Imaging: No results found.   PMFS History: Patient Active Problem List   Diagnosis Date Noted   Bilateral hand pain 03/29/2024   Glucosuria 11/26/2023   Genetic testing 08/31/2023   Mouth sores 07/22/2023   Vaginal discharge 07/17/2023   Generalized abdominal pain 07/17/2023  High serum vitamin B12 07/17/2023   Immunocompromised (HCC) 05/28/2023   Hot flashes 09/09/2022   Iron deficiency anemia 09/09/2022   Globus sensation 07/22/2022   Adjustment disorder with depressed mood 05/03/2022   Intentional overdose (HCC) 05/02/2022   Stress and adjustment reaction 02/19/2022   Housing instability 02/19/2022   Memory difficulty 02/04/2022   Nocturnal leg cramps 02/04/2022   Perspiration excessive 11/11/2021   Seasonal allergies 11/11/2021   At high risk for breast cancer 10/28/2021   Chronic right-sided low back pain without sciatica 09/23/2021   Tinea 09/23/2021   Diabetic retinopathy associated with type 1 diabetes mellitus (HCC) 11/22/2020   External hemorrhoid 09/18/2020   History of herpes zoster of eye 08/22/2020   Rheumatoid arthritis (HCC) 06/05/2020   Oral herpes simplex infection 06/05/2020   Migraines 06/05/2020   Fibroids  06/05/2020   Recurrent UTI 06/05/2020   Primary osteoarthritis of right hip 09/28/2018   Adhesive capsulitis of left shoulder 03/24/2018   Sjogren's syndrome without extraglandular involvement (HCC) 02/24/2018   SS-A antibody positive 02/24/2018   Fibromyalgia 12/02/2017   G6PD deficiency 10/26/2017   Type 1 diabetes mellitus with complications (HCC) 04/03/2017   Primary generalized (osteo)arthritis 11/17/2016   Primary osteoarthritis, left hand 07/13/2015   Gastroparesis due to DM (HCC) 08/30/2013   Pelvic floor dysfunction 08/18/2013   Constipation 08/03/2013   Past Medical History:  Diagnosis Date   Arthritis    knees, lower back right side   Connective tissue disease (HCC)    Depression    Diabetes mellitus    Diabetes mellitus type 1, uncontrolled, insulin  dependent 04/03/2017   Fibromyalgia 12/02/2017   G6PD deficiency 10/26/2017   Gastroparesis    Headache(784.0)    Heart murmur    as a child - no problem as adult   History of kidney stones    no surgery   Hyperlipidemia    Lupus    Migraines 06/05/2020   Rheumatoid arthritis (HCC) 06/05/2020   Sjogren's syndrome without extraglandular involvement (HCC) 02/24/2018    Family History  Problem Relation Age of Onset   Lung cancer Mother        or other primary? mets d. 34   Ovarian cancer Mother        dx late 23s   Diabetes Sister    Arthritis Sister    Hyperlipidemia Sister    Alzheimer's disease Maternal Grandmother    Breast cancer Half-Sister 72       d. 74; mat half sister    Past Surgical History:  Procedure Laterality Date   CESAREAN SECTION  1994, 2006   x 2   COLONOSCOPY WITH PROPOFOL  N/A 03/16/2013   Procedure: COLONOSCOPY WITH PROPOFOL ;  Surgeon: Elsie Cree, MD;  Location: [facility];  Service: Endoscopy;  Laterality: N/A;   HYSTEROSCOPY WITH D & C N/A 07/11/2013   Procedure: DILATATION AND CURETTAGE /HYSTEROSCOPY;  Surgeon: Dickie DELENA Carder, MD;  Location: [facility];  Service: Gynecology;   Laterality: N/A;   left surgery shoulder     ROBOTIC ASSISTED LAPAROSCOPIC LYSIS OF ADHESION N/A 07/11/2013   Procedure: ROBOTIC ASSISTED LAPAROSCOPIC LYSIS OF ADHESIONS ; REPAIR OF UTERINE DEHISENCE;  Surgeon: Dickie DELENA Carder, MD;  Location: [facility];  Service: Gynecology;  Laterality: N/A;   TUBAL LIGATION     Social History   Occupational History   Occupation: Unemployed  Tobacco Use   Smoking status: Never   Smokeless tobacco: Never  Vaping Use   Vaping status: Never Used  Substance and  Sexual Activity   Alcohol  use: No   Drug use: No   Sexual activity: Yes    Partners: Male    Birth control/protection: Surgical

## 2024-03-31 ENCOUNTER — Telehealth: Payer: Self-pay | Admitting: Family

## 2024-03-31 NOTE — Telephone Encounter (Signed)
 Spoke with pt. She is needing an A1C done by tomorrow. Advised her that Ginger is not in the office today and I would address this with her tomorrow. Pt verbalized understanding.

## 2024-03-31 NOTE — Telephone Encounter (Signed)
 Copied from CRM #[id number]. Topic: Clinical - Request for Lab/Test Order >> Mar 31, 2024  2:25 PM Aisha D wrote: Reason for CRM: Pt stated that she needs labs ordered to have her A1c checked. Pt stated that she needs the labs done by tomorrow and would like to have them submitted today. Pt would like a callback with an update on this request.   ----------------------------------------------------------------------- From previous Reason for Contact - Lab/Test Results: Reason for CRM:

## 2024-04-01 NOTE — Telephone Encounter (Signed)
 Spoke with pt and she is aware of [name]'s message.

## 2024-04-01 NOTE — Telephone Encounter (Signed)
 This needs to be done by her endo since she is a type 1 I do not manage

## 2024-04-23 ENCOUNTER — Emergency Department
Admission: EM | Admit: 2024-04-23 | Discharge: 2024-04-23 | Discharge status: Against Medical Advice | Attending: Emergency Medicine | Admitting: Emergency Medicine

## 2024-04-23 ENCOUNTER — Other Ambulatory Visit: Payer: Self-pay

## 2024-04-23 ENCOUNTER — Encounter: Payer: Self-pay | Admitting: Emergency Medicine

## 2024-04-23 DIAGNOSIS — Z5321 Procedure and treatment not carried out due to patient leaving prior to being seen by health care provider: Secondary | ICD-10-CM | POA: Diagnosis not present

## 2024-04-23 DIAGNOSIS — R21 Rash and other nonspecific skin eruption: Secondary | ICD-10-CM | POA: Diagnosis not present

## 2024-04-23 DIAGNOSIS — J029 Acute pharyngitis, unspecified: Secondary | ICD-10-CM | POA: Insufficient documentation

## 2024-04-23 LAB — GROUP A STREP BY PCR: Group A Strep by PCR: NOT DETECTED

## 2024-04-23 NOTE — ED Triage Notes (Signed)
 Pt to ED c/o sore throat 20 minutes PTA.  States painful to swallow.  No obvious swelling, maintaining secretions, chest rise even and unlabored, in NAD at this time.  Took benadryl  around 2300 d/t rash on her back that has cleared.  Just returned from [place] .

## 2024-04-25 ENCOUNTER — Encounter: Payer: Self-pay | Admitting: Family

## 2024-04-25 ENCOUNTER — Ambulatory Visit: Admitting: Family

## 2024-04-25 VITALS — BP 124/72 | HR 93 | Temp 98.2°F | Ht 67.0 in | Wt 156.0 lb

## 2024-04-25 DIAGNOSIS — F418 Other specified anxiety disorders: Secondary | ICD-10-CM

## 2024-04-25 DIAGNOSIS — J029 Acute pharyngitis, unspecified: Secondary | ICD-10-CM

## 2024-04-25 LAB — POCT RAPID STREP A (OFFICE): Rapid Strep A Screen: NEGATIVE

## 2024-04-25 MED ORDER — ALPRAZOLAM 0.25 MG PO TABS: ORAL_TABLET | ORAL | 0 refills | Status: DC

## 2024-04-25 NOTE — Progress Notes (Signed)
 e  Established Patient Office Visit  Subjective:      CC:  Chief Complaint  Patient presents with   Follow-up    Pt is scheduled tomorrow for cataract removal. She would like for a sedative to be given to her. States that her surgeon only advised for her to take tylenol .    HPI: Tiffany [name] is a 53 y.o. female presenting on 04/25/2024 for Follow-up (Pt is scheduled tomorrow for cataract removal. She would like for a sedative to be given to her. States that her surgeon only advised for her to take tylenol .) .  Discussed the use of AI scribe software for clinical note transcription with the patient, who gave verbal consent to proceed.  History of Present Illness Tiffany [name] is a 53 year old female who presents with anxiety regarding an upcoming cataract procedure.  She is experiencing significant anxiety about an upcoming cataract procedure scheduled for tomorrow. She fears the pain associated with the procedure, particularly due to her diabetes-related cataracts. She recalls previous distressing experiences with eye injections and is concerned about anticipated discomfort during the procedure.  A couple of days ago, she developed a sore throat after a plane ride, describing it as 'real scratchy and sore'. She was initially concerned and visited a hospital where strep throat was ruled out. She took Benadryl  around 11 PM and notes improvement in her symptoms, feeling a lot better today. No sinus pressure, ear pain, or cough.  She experienced a rash on her back after consuming seafood, which was painful during a two and a half hour ride home. Her husband experienced a mild reaction as well. She is not allergic to seafood and suspects it might have been related to something consumed at the airport. The rash has since resolved.  She traveled from [place]  and mentions a preference for [facility] despite its expense.         Social history:  Relevant past medical, surgical,  family and social history reviewed and updated as indicated. Interim medical history since our last visit reviewed.  Allergies and medications reviewed and updated.  DATA REVIEWED: CHART IN EPIC     ROS: Negative unless specifically indicated above in HPI.    Current Outpatient Medications:    albuterol  (VENTOLIN  HFA) 108 (90 Base) MCG/ACT inhaler, Inhale 1-2 puffs into the lungs every 6 (six) hours as needed for wheezing or shortness of breath., Disp: 1 each, Rfl: 0   ALPRAZolam (XANAX) 0.25 MG tablet, Take one po 1 hour prior to procedure prn anxiety, Disp: 6 tablet, Rfl: 0   aluminum  chloride (DRYSOL) 20 % external solution, Apply topically every other day. For sweating. (Patient taking differently: Apply 1 application  topically every other day. For sweating.), Disp: 35 mL, Rfl: 1   APIDRA SOLOSTAR 100 UNIT/ML Solostar Pen, Inject into the skin., Disp: , Rfl:    atorvastatin  (LIPITOR) 40 MG tablet, Take 1 tablet (40 mg total) by mouth every morning., Disp: 90 tablet, Rfl: 3   bisacodyl  (DULCOLAX) 5 MG EC tablet, Take 5 mg by mouth daily as needed for mild constipation., Disp: , Rfl:    Continuous Blood Gluc Sensor (DEXCOM G6 SENSOR) MISC, Apply 1 sensor to the skin every 10 days for continuous glucose monitoring., Disp: , Rfl:    Continuous Blood Gluc Transmit (DEXCOM G6 TRANSMITTER) MISC, Use as directed for continuous glucose monitoring. Reuse transmitter for 90 days then discard and replace., Disp: , Rfl:    fluticasone  (FLOVENT  HFA) 44  MCG/ACT inhaler, Inhale 2 puffs into the lungs 2 (two) times daily., Disp: 1 each, Rfl: 6   folic acid  (FOLVITE ) 1 MG tablet, Take 1 mg by mouth daily., Disp: , Rfl:    gabapentin  (NEURONTIN ) 100 MG capsule, Take 1 capsule (100 mg total) by mouth 2 (two) times daily., Disp: 60 capsule, Rfl: 0   glucagon 1 MG injection, Inject 1 mg into the skin once as needed (low blood sugar)., Disp: , Rfl:    insulin  glargine (LANTUS  SOLOSTAR) 100 UNIT/ML Solostar  Pen, Inject 20 Units into the skin daily. If pump failure, take 20 units, Disp: , Rfl:    insulin  glulisine (APIDRA) 100 UNIT/ML injection, Inject via insulin  pump up to 60 units daily for T1DM, Disp: , Rfl:    linaclotide  (LINZESS ) 145 MCG CAPS capsule, Take 1 capsule (145 mcg total) by mouth daily before breakfast., Disp: 90 capsule, Rfl: 0   methotrexate (RHEUMATREX) 2.5 MG tablet, Take 15 mg by mouth once a week. Take 6 tablets (12.5 mg total) by mouth once a week, Disp: , Rfl:    Multiple Vitamin (MULTI-VITAMINS) TABS, Take 1 tablet by mouth daily., Disp: , Rfl:    nitrofurantoin , macrocrystal-monohydrate, (MACROBID ) 100 MG capsule, Take 1 capsule (100 mg total) by mouth daily., Disp: 30 capsule, Rfl: 11   ONETOUCH ULTRA test strip, TEST 4 TO 6 TIMES DAILY AS DIRECTED, Disp: , Rfl:    pantoprazole  (PROTONIX ) 40 MG tablet, TAKE 1 TABLET BY MOUTH ONCE DAILY, Disp: 30 tablet, Rfl: 1   sulfamethoxazole -trimethoprim  (BACTRIM  DS) 800-160 MG tablet, Take 1 tablet by mouth 2 (two) times daily. For urinary tract infection., Disp: 14 tablet, Rfl: 0   SUMAtriptan (IMITREX) 100 MG tablet, Take 100 mg by mouth as needed for migraine., Disp: , Rfl:    Urine Glucose-Ketones Test (KETO-DIASTIX) STRP, Use to check urine for ketones with high blood glucose, Disp: , Rfl:         Objective:        BP 124/72 (BP Location: Left Arm, Patient Position: Sitting, Cuff Size: Normal)   Pulse 93   Temp 98.2 F (36.8 C) (Temporal)   Ht 5' 7 (1.702 m)   Wt 156 lb (70.8 kg)   SpO2 98%   BMI 24.43 kg/m   Physical Exam HEENT: Throat slightly erythematous.  Wt Readings from Last 3 Encounters:  04/25/24 156 lb (70.8 kg)  12/03/23 157 lb 3.2 oz (71.3 kg)  11/26/23 153 lb 3.2 oz (69.5 kg)    Physical Exam Constitutional:      General: She is not in acute distress.    Appearance: Normal appearance. She is normal weight. She is not ill-appearing, toxic-appearing or diaphoretic.  HENT:     Head:  Normocephalic.     Right Ear: Tympanic membrane normal.     Left Ear: Tympanic membrane normal.     Nose: Nose normal.     Mouth/Throat:     Mouth: Mucous membranes are dry.     Pharynx: Posterior oropharyngeal erythema and postnasal drip present. No oropharyngeal exudate.     Tonsils: 0 on the right. 0 on the left.  Eyes:     Extraocular Movements: Extraocular movements intact.     Pupils: Pupils are equal, round, and reactive to light.  Cardiovascular:     Rate and Rhythm: Normal rate and regular rhythm.     Pulses: Normal pulses.     Heart sounds: Normal heart sounds.  Pulmonary:     Effort: Pulmonary  effort is normal.     Breath sounds: Normal breath sounds.  Musculoskeletal:     Cervical back: Normal range of motion.  Lymphadenopathy:     Cervical: Cervical adenopathy present.     Right cervical: Superficial cervical adenopathy present.     Left cervical: Superficial cervical adenopathy present.  Neurological:     General: No focal deficit present.     Mental Status: She is alert and oriented to person, place, and time. Mental status is at baseline.  Psychiatric:        Mood and Affect: Mood normal.        Behavior: Behavior normal.        Thought Content: Thought content normal.        Judgment: Judgment normal.          Results LABS Strep Throat Culture: Negative (04/23/2024)  Assessment & Plan:   Assessment and Plan Assessment & Plan Situational anxiety related to upcoming cataract surgery Anxiety related to anticipated pain and discomfort from upcoming cataract surgery. Concerns about the procedure and potential pain management options. No history of addictive behavior or prior use of benzodiazepines. Discussed the use of Xanax for situational anxiety, emphasizing its calming effect rather than pain relief. - Prescribe Xanax to be taken one hour prior to the procedure to manage situational anxiety. Pdmp reviewed, advised do not take xanax with alcohol  or other  sedative type medications  - Advise to try Xanax the night before the procedure to assess its effects. - Ensure someone is available to drive her home post-procedure.  Cataract (planned surgery) Scheduled for cataract surgery. Concerns about pain management during the procedure due to previous experiences with eye injections. The cataracts are complicated by diabetes, making numbing difficult. Discussed that most patients manage with Tylenol , and the surgical team should be consulted for pain management options. - Proceed with cataract surgery as planned.  Recent resolved sore throat Sore throat experienced after a flight, resolved over a few days. No strep throat confirmed by previous hospital visit. Mild residual redness noted on examination. - Perform a strep test to confirm resolution, negative in office  -Advised patient on supportive measures:  Be sure to rest, drink plenty of fluids, and use tylenol  or ibuprofen  as needed for pain. Follow up if fever >101, if symptoms worsen or if symptoms are not improved in 3 days. Patient verbalizes understanding.    Recent resolved rash after seafood ingestion Rash developed after seafood ingestion, resolved subsequently. No known seafood allergy , but both she and her husband experienced symptoms, raising concerns about the seafood quality. Continue to assess need for epi pen   Recording duration: 9 minutes      Return if symptoms worsen or fail to improve.     Ginger Patrick, MSN, APRN, FNP-C [facility]

## 2024-05-22 ENCOUNTER — Ambulatory Visit
Admission: RE | Admit: 2024-05-22 | Discharge: 2024-05-22 | Disposition: A | Discharge status: Home / Self Care | Source: Ambulatory Visit

## 2024-05-22 DIAGNOSIS — D49512 Neoplasm of unspecified behavior of left kidney: Secondary | ICD-10-CM

## 2024-05-22 MED ORDER — GADOPICLENOL 0.5 MMOL/ML IV SOLN
7.0000 mL | Freq: Once | INTRAVENOUS | Status: AC | PRN
  Administered 2024-05-22: 7 mL via INTRAVENOUS

## 2024-05-30 ENCOUNTER — Encounter: Payer: Self-pay | Admitting: Radiology

## 2024-07-30 ENCOUNTER — Ambulatory Visit (INDEPENDENT_AMBULATORY_CARE_PROVIDER_SITE_OTHER)

## 2024-07-30 ENCOUNTER — Ambulatory Visit
Admission: EM | Admit: 2024-07-30 | Discharge: 2024-07-30 | Disposition: A | Discharge status: Home / Self Care | Attending: Family Medicine | Admitting: Family Medicine

## 2024-07-30 DIAGNOSIS — J40 Bronchitis, not specified as acute or chronic: Secondary | ICD-10-CM

## 2024-07-30 DIAGNOSIS — J329 Chronic sinusitis, unspecified: Secondary | ICD-10-CM

## 2024-07-30 DIAGNOSIS — R058 Other specified cough: Secondary | ICD-10-CM | POA: Diagnosis not present

## 2024-07-30 LAB — POC COVID19/FLU A&B COMBO
Covid Antigen, POC: NEGATIVE
Influenza A Antigen, POC: NEGATIVE
Influenza B Antigen, POC: NEGATIVE

## 2024-07-30 MED ORDER — IBUPROFEN 400 MG PO TABS: 400.0000 mg | ORAL_TABLET | Freq: Three times a day (TID) | ORAL | 0 refills | Status: DC | PRN

## 2024-07-30 MED ORDER — ACETAMINOPHEN 325 MG PO TABS: 650.0000 mg | ORAL_TABLET | Freq: Four times a day (QID) | ORAL | 0 refills | Status: AC | PRN

## 2024-07-30 MED ORDER — ALBUTEROL SULFATE HFA 108 (90 BASE) MCG/ACT IN AERS: 1.0000 | INHALATION_SPRAY | Freq: Four times a day (QID) | RESPIRATORY_TRACT | 0 refills | Status: AC | PRN

## 2024-07-30 MED ORDER — PROMETHAZINE-DM 6.25-15 MG/5ML PO SYRP: 5.0000 mL | ORAL_SOLUTION | Freq: Three times a day (TID) | ORAL | 0 refills | Status: DC | PRN

## 2024-07-30 NOTE — Discharge Instructions (Addendum)
 We will manage this as a viral respiratory illness. For sore throat or cough try using a honey-based tea. Use 3 teaspoons of honey with juice squeezed from half lemon. Place shaved pieces of ginger into 1/2-1 cup of water  and warm over stove top. Then mix the ingredients and repeat every 4 hours as needed. Please take ibuprofen  400mg  every 8 hours with food alternating with OR taken together with Tylenol  500mg -650mg  every 6 hours for throat pain, fevers, aches and pains. Hydrate very well with at least 2 liters of water . Eat light meals such as soups (chicken and noodles, vegetable, chicken and wild rice).  Do not eat foods that you are allergic to.  Taking an antihistamine like Zyrtec (10mg  daily) can help against postnasal drainage, sinus congestion which can cause sinus pain, sinus headaches, throat pain, painful swallowing, coughing.  You can take this together with cough medication as needed.

## 2024-07-30 NOTE — ED Triage Notes (Signed)
 Pt c/o prod cough, head/chest congestion, sore throat, HA x 4 days-taking mucinex and zicam-NAD-steady gait

## 2024-07-30 NOTE — ED Provider Notes (Signed)
 " [facility]  Note:  This document was prepared using Conservation officer, historic buildings and may include unintentional dictation errors.  MRN: [id number] DOB: [birth year]  Subjective:   Tiffany [name] is a 54 y.o. female presenting for 4-day history of sinus and chest congestion, throat pain, drainage, headaches.  Cough has now become productive and is eliciting chest pain, shortness of breath, wheezing.  Has used Mucinex and Zicam.  No history of asthma.  No smoking of any kind including cigarettes, cigars, vaping, marijuana use.  Has type 1 diabetes, blood sugars are not well-controlled.    Current Outpatient Medications  Medication Instructions   albuterol  (VENTOLIN  HFA) 108 (90 Base) MCG/ACT inhaler 1-2 puffs, Inhalation, Every 6 hours PRN   ALPRAZolam  (XANAX ) 0.25 MG tablet Take one po 1 hour prior to procedure prn anxiety   aluminum  chloride (DRYSOL) 20 % external solution Topical, Every other day, For sweating.   APIDRA SOLOSTAR 100 UNIT/ML Solostar Pen Inject into the skin.   atorvastatin  (LIPITOR) 40 mg, Oral, Every morning   bisacodyl  (DULCOLAX) 5 mg, Daily PRN   Continuous Blood Gluc Sensor (DEXCOM G6 SENSOR) MISC Apply 1 sensor to the skin every 10 days for continuous glucose monitoring.   Continuous Blood Gluc Transmit (DEXCOM G6 TRANSMITTER) MISC Use as directed for continuous glucose monitoring. Reuse transmitter for 90 days then discard and replace.   fluticasone  (FLOVENT  HFA) 44 MCG/ACT inhaler 2 puffs, Inhalation, 2 times daily   folic acid  (FOLVITE ) 1 mg, Daily   gabapentin  (NEURONTIN ) 100 mg, Oral, 2 times daily   glucagon 1 mg, Once PRN   insulin  glulisine (APIDRA) 100 UNIT/ML injection Inject via insulin  pump up to 60 units daily for T1DM   Lantus  SoloStar 20 Units, Daily   linaclotide  (LINZESS ) 145 mcg, Oral, Daily before breakfast   methotrexate (RHEUMATREX) 15 mg, Weekly   Multiple Vitamin (MULTI-VITAMINS) TABS 1 tablet, Daily    nitrofurantoin  (macrocrystal-monohydrate) (MACROBID ) 100 mg, Oral, Daily   ONETOUCH ULTRA test strip TEST 4 TO 6 TIMES DAILY AS DIRECTED   pantoprazole  (PROTONIX ) 40 mg, Oral, Daily   sulfamethoxazole -trimethoprim  (BACTRIM  DS) 800-160 MG tablet 1 tablet, Oral, 2 times daily, For urinary tract infection.   SUMAtriptan (IMITREX) 100 mg, As needed   Urine Glucose-Ketones Test (KETO-DIASTIX) STRP Use to check urine for ketones with high blood glucose    Allergies[1]  Past Medical History:  Diagnosis Date   Arthritis    knees, lower back right side   Connective tissue disease    Depression    Diabetes mellitus    Diabetes mellitus type 1, uncontrolled, insulin  dependent 04/03/2017   Fibromyalgia 12/02/2017   G6PD deficiency 10/26/2017   Gastroparesis    Headache(784.0)    Heart murmur    as a child - no problem as adult   History of kidney stones    no surgery   Hyperlipidemia    Lupus    Migraines 06/05/2020   Rheumatoid arthritis (HCC) 06/05/2020   Sjogren's syndrome without extraglandular involvement 02/24/2018     Past Surgical History:  Procedure Laterality Date   CESAREAN SECTION  1994, 2006   x 2   COLONOSCOPY WITH PROPOFOL  N/A 03/16/2013   Procedure: COLONOSCOPY WITH PROPOFOL ;  Surgeon: Elsie Cree, MD;  Location: [facility];  Service: Endoscopy;  Laterality: N/A;   HYSTEROSCOPY WITH D & C N/A 07/11/2013   Procedure: DILATATION AND CURETTAGE /HYSTEROSCOPY;  Surgeon: Dickie DELENA Carder, MD;  Location: [facility];  Service: Gynecology;  Laterality: N/A;   left surgery shoulder     ROBOTIC ASSISTED LAPAROSCOPIC LYSIS OF ADHESION N/A 07/11/2013   Procedure: ROBOTIC ASSISTED LAPAROSCOPIC LYSIS OF ADHESIONS ; REPAIR OF UTERINE DEHISENCE;  Surgeon: Dickie DELENA Carder, MD;  Location: [facility];  Service: Gynecology;  Laterality: N/A;   TUBAL LIGATION      Family History  Problem Relation Age of Onset   Lung cancer Mother        or other primary? mets d. 24   Ovarian cancer  Mother        dx late 11s   Diabetes Sister    Arthritis Sister    Hyperlipidemia Sister    Alzheimer's disease Maternal Grandmother    Breast cancer Half-Sister 75       d. 73; mat half sister    Social History   Occupational History   Occupation: Unemployed  Tobacco Use   Smoking status: Never   Smokeless tobacco: Never  Vaping Use   Vaping status: Never Used  Substance and Sexual Activity   Alcohol  use: No   Drug use: No   Sexual activity: Yes    Partners: Male    Birth control/protection: Surgical     ROS   Objective:   Vitals: BP (!) 163/82 (BP Location: Right Arm)   Pulse (!) 112   Temp 98.3 F (36.8 C) (Oral)   Resp 20   SpO2 96%   BP Readings from Last 3 Encounters:  07/30/24 (!) 163/82  04/25/24 124/72  12/03/23 130/75    Physical Exam Constitutional:      General: She is not in acute distress.    Appearance: Normal appearance. She is well-developed. She is not ill-appearing, toxic-appearing or diaphoretic.  HENT:     Head: Normocephalic and atraumatic.     Nose: Congestion present. No rhinorrhea.     Mouth/Throat:     Mouth: Mucous membranes are moist.     Pharynx: No pharyngeal swelling, oropharyngeal exudate, posterior oropharyngeal erythema or uvula swelling.     Tonsils: No tonsillar exudate or tonsillar abscesses. 0 on the right. 0 on the left.     Comments: Thick streaks of postnasal drainage overlying pharynx.  Eyes:     General: No scleral icterus.       Right eye: No discharge.        Left eye: No discharge.     Extraocular Movements: Extraocular movements intact.  Cardiovascular:     Rate and Rhythm: Normal rate and regular rhythm.     Heart sounds: Normal heart sounds. No murmur heard.    No friction rub. No gallop.  Pulmonary:     Effort: Pulmonary effort is normal. No respiratory distress.     Breath sounds: No stridor. No wheezing, rhonchi or rales.  Chest:     Chest wall: No tenderness.  Skin:    General: Skin is warm  and dry.  Neurological:     General: No focal deficit present.     Mental Status: She is alert and oriented to person, place, and time.     Cranial Nerves: No cranial nerve deficit.     Motor: No weakness.     Coordination: Coordination normal.     Gait: Gait normal.  Psychiatric:        Mood and Affect: Mood normal.        Behavior: Behavior normal.     Results for orders placed or performed during the hospital encounter of 07/30/24 (from the past  24 hours)  POC Covid19/Flu A&B Antigen     Status: None   Collection Time: 07/30/24 12:53 PM  Result Value Ref Range   Influenza A Antigen, POC Negative Negative   Influenza B Antigen, POC Negative Negative   Covid Antigen, POC Negative Negative   DG Chest 2 View Result Date: 07/30/2024 CLINICAL DATA:  Productive cough EXAM: DG CHEST 2V COMPARISON:  July 22, 2022 FINDINGS: The heart size and mediastinal contours are within normal limits. Both lungs are clear. The visualized skeletal structures are unremarkable. IMPRESSION: No active cardiopulmonary disease. Electronically Signed   By: Lynwood Landy Raddle M.D.   On: 07/30/2024 13:15    Assessment and Plan :   PDMP not reviewed this encounter.  1. Sinobronchitis   2. Productive cough      Recommend managing for sinobronchitis with supportive care, albuterol  for her shortness of breath, wheezing and respiratory symptoms.  Push fluids.  Counseled patient on potential for adverse effects with medications prescribed/recommended today, ER and return-to-clinic precautions discussed, patient verbalized understanding.     [1]  Allergies Allergen Reactions   Morphine  Itching and Hives   Duloxetine Hcl     Other reaction(s): constipation   Gabapentin      Leg Pain   Morphine  And Codeine Hives and Itching   Tramadol Hives and Itching     Christopher Savannah, PA-C 07/30/24 1356  "

## 2024-08-02 ENCOUNTER — Ambulatory Visit: Payer: Self-pay

## 2024-08-02 NOTE — Telephone Encounter (Signed)
 NOTED

## 2024-08-02 NOTE — Telephone Encounter (Signed)
 FYI Only or Action Required?: FYI only for provider: appointment scheduled on 08/03/24.  Patient was last seen in primary care on 04/25/2024 by Corwin Antu, FNP.  Called Nurse Triage reporting Cough.  Symptoms began several days ago.  Interventions attempted: Other: Inhaler, promethazine , Mucinex.  Symptoms are: gradually worsening.  Triage Disposition: See Physician Within 24 Hours  Patient/caregiver understands and will follow disposition?: Yes        Copied from CRM [id number]. Topic: Clinical - Red Word Triage >> Aug 02, 2024  1:54 PM Tiffany [name] wrote: Red Word that prompted transfer to Nurse Triage: tightness in chest,cough ,nasal congestion Reason for Disposition  [1] Continuous (nonstop) coughing interferes with work or school AND [2] no improvement using cough treatment per Care Advice  Answer Assessment - Initial Assessment Questions 1. ONSET: When did the cough begin?      X 5 days ago   2. SEVERITY: How bad is the cough today?      Intermittent, deep cough   3. SPUTUM: Describe the color of your sputum (e.g., none, dry cough; clear, white, yellow, green)     Dry cough   4. HEMOPTYSIS: Are you coughing up any blood? If Yes, ask: How much? (e.g., flecks, streaks, tablespoons, etc.)     No   5. DIFFICULTY BREATHING: Are you having difficulty breathing? If Yes, ask: How bad is it? (e.g., mild, moderate, severe)      No   6. FEVER: Do you have a fever? If Yes, ask: What is your temperature, how was it measured, and when did it start?     No   7. CARDIAC HISTORY: Do you have any history of heart disease? (e.g., heart attack, congestive heart failure)      No   8. LUNG HISTORY: Do you have any history of lung disease?  (e.g., pulmonary embolus, asthma, emphysema)     No   10. OTHER SYMPTOMS: Do you have any other symptoms? (e.g., runny nose, wheezing, chest pain)       Nasal congestion, runny nose      Patient called in to triage with  complaints of chest tightness related to cough, cough, nasal congestion.  This has been ongoing for 5 days The patient stated she was in the UC on 1/3 and dx. With Sinobronchitis. She has completed the promethazine  as prescribed, and is using the inhaler provided.  For home care, the patient is taking Mucinex.  Virtual UC Appointment scheduled for further evaluation on 1/7 as there are no appts. In office/virtual until 1/8 per Epic; Patient agrees with the plan of care, and will reach out if symptoms worsen or persist.  Protocols used: Cough - Acute Productive-A-AH

## 2024-08-03 ENCOUNTER — Telehealth

## 2024-08-03 ENCOUNTER — Other Ambulatory Visit: Payer: Self-pay | Admitting: Emergency Medicine

## 2024-08-03 DIAGNOSIS — B9689 Other specified bacterial agents as the cause of diseases classified elsewhere: Secondary | ICD-10-CM | POA: Diagnosis not present

## 2024-08-03 DIAGNOSIS — J208 Acute bronchitis due to other specified organisms: Secondary | ICD-10-CM | POA: Diagnosis not present

## 2024-08-03 MED ORDER — PROMETHAZINE-DM 6.25-15 MG/5ML PO SYRP: 5.0000 mL | ORAL_SOLUTION | Freq: Three times a day (TID) | ORAL | 0 refills | Status: AC | PRN

## 2024-08-03 MED ORDER — FLUTICASONE PROPIONATE HFA 44 MCG/ACT IN AERO: 2.0000 | INHALATION_SPRAY | Freq: Two times a day (BID) | RESPIRATORY_TRACT | 0 refills | Status: DC

## 2024-08-03 MED ORDER — AZITHROMYCIN 250 MG PO TABS: ORAL_TABLET | ORAL | 0 refills | Status: AC

## 2024-08-03 NOTE — Progress Notes (Signed)
 " Virtual Visit Consent   Tiffany [name], you are scheduled for a virtual visit with a [facility] provider today. Just as with appointments in the office, your consent must be obtained to participate. Your consent will be active for this visit and any virtual visit you may have with one of our providers in the next 365 days. If you have a MyChart account, a copy of this consent can be sent to you electronically.  As this is a virtual visit, video technology does not allow for your provider to perform a traditional examination. This may limit your provider's ability to fully assess your condition. If your provider identifies any concerns that need to be evaluated in person or the need to arrange testing (such as labs, EKG, etc.), we will make arrangements to do so. Although advances in technology are sophisticated, we cannot ensure that it will always work on either your end or our end. If the connection with a video visit is poor, the visit may have to be switched to a telephone visit. With either a video or telephone visit, we are not always able to ensure that we have a secure connection.  By engaging in this virtual visit, you consent to the provision of healthcare and authorize for your insurance to be billed (if applicable) for the services provided during this visit. Depending on your insurance coverage, you may receive a charge related to this service.  I need to obtain your verbal consent now. Are you willing to proceed with your visit today? Tiffany [name] has provided verbal consent on 08/03/2024 for a virtual visit (video or telephone). Tiffany CHRISTELLA Belt, NP  Date: 08/03/2024 11:09 AM   Virtual Visit via Video Note   I, Tiffany [name], connected with  Tiffany [name]  ([id number], Jul 28, 1971) on 08/03/2024 at 11:00 AM EST by a video-enabled telemedicine application and verified that I am speaking with the correct person using two identifiers.  Location: Patient: Virtual Visit Location Patient:  Home Provider: Virtual Visit Location Provider: Home Office   I discussed the limitations of evaluation and management by telemedicine and the availability of in person appointments. The patient expressed understanding and agreed to proceed.    History of Present Illness: Tiffany [name] is a 54 y.o. who identifies as a female who was assigned female at birth, and is being seen today for illness  Still sick after UC encounter 1/3 and I need some more medicine.   Sx started 7 days ago, mild scratchy throat and runny nose but mostly was cough right from the beginning. Still coughing. Cough medicine doesn't help but does help her to sleep  Now also has head congestion and headache.   Nasal discharge is clear.   Sputum is yellow.   Blood sugar has been ok.   No fever or chills.   Taking albuterol  rarely because she thought it was a steroid  Has not been using saline spray with this illness but has in hte past  HPI: HPI  Problems:  Patient Active Problem List   Diagnosis Date Noted   Situational anxiety 04/25/2024   Bilateral hand pain 03/29/2024   Glucosuria 11/26/2023   Genetic testing 08/31/2023   Mouth sores 07/22/2023   Vaginal discharge 07/17/2023   Generalized abdominal pain 07/17/2023   High serum vitamin B12 07/17/2023   Immunocompromised 05/28/2023   Hot flashes 09/09/2022   Iron deficiency anemia 09/09/2022   Globus sensation 07/22/2022   Adjustment disorder with depressed mood  05/03/2022   Intentional overdose (HCC) 05/02/2022   Stress and adjustment reaction 02/19/2022   Housing instability 02/19/2022   Memory difficulty 02/04/2022   Nocturnal leg cramps 02/04/2022   Perspiration excessive 11/11/2021   Seasonal allergies 11/11/2021   At high risk for breast cancer 10/28/2021   Chronic right-sided low back pain without sciatica 09/23/2021   Tinea 09/23/2021   Diabetic retinopathy associated with type 1 diabetes mellitus (HCC) 11/22/2020   External  hemorrhoid 09/18/2020   History of herpes zoster of eye 08/22/2020   Rheumatoid arthritis (HCC) 06/05/2020   Oral herpes simplex infection 06/05/2020   Migraines 06/05/2020   Fibroids 06/05/2020   Recurrent UTI 06/05/2020   Primary osteoarthritis of right hip 09/28/2018   Adhesive capsulitis of left shoulder 03/24/2018   Sjogren's syndrome without extraglandular involvement 02/24/2018   SS-A antibody positive 02/24/2018   Fibromyalgia 12/02/2017   G6PD deficiency 10/26/2017   Type 1 diabetes mellitus with complications (HCC) 04/03/2017   Primary generalized (osteo)arthritis 11/17/2016   Primary osteoarthritis, left hand 07/13/2015   Gastroparesis due to DM (HCC) 08/30/2013   Pelvic floor dysfunction 08/18/2013   Constipation 08/03/2013    Allergies: Allergies[1] Medications: Current Medications[2]  Observations/Objective: Patient is well-developed, well-nourished in no acute distress.  Resting comfortably  at home.  Head is normocephalic, atraumatic.  No labored breathing.  Speech is clear and coherent with logical content.  Patient is alert and oriented at baseline.    Assessment and Plan: 1. Acute bacterial bronchitis (Primary)  Explained how to use albuterol  and that it is not a steroid. I do think she would benefit from steroid but appreciate she has DM1.  Will try flovent  inhaler. If not feeling better within 2 days, she should be checked in person. Also in person if significantly worsening in that time period  Follow Up Instructions: I discussed the assessment and treatment plan with the patient. The patient was provided an opportunity to ask questions and all were answered. The patient agreed with the plan and demonstrated an understanding of the instructions.  A copy of instructions were sent to the patient via MyChart unless otherwise noted below.   The patient was advised to call back or seek an in-person evaluation if the symptoms worsen or if the condition fails to  improve as anticipated.    Tiffany CHRISTELLA Belt, NP    [1]  Allergies Allergen Reactions   Morphine  Itching and Hives   Duloxetine Hcl     Other reaction(s): constipation   Gabapentin      Leg Pain   Morphine  And Codeine Hives and Itching   Tramadol Hives and Itching  [2]  Current Outpatient Medications:    azithromycin  (ZITHROMAX ) 250 MG tablet, Take 2 tablets on day 1, then 1 tablet daily on days 2 through 5, Disp: 6 tablet, Rfl: 0   fluticasone  (FLOVENT  HFA) 44 MCG/ACT inhaler, Inhale 2 puffs into the lungs 2 (two) times daily., Disp: 1 each, Rfl: 0   acetaminophen  (TYLENOL ) 325 MG tablet, Take 2 tablets (650 mg total) by mouth every 6 (six) hours as needed for moderate pain (pain score 4-6)., Disp: 30 tablet, Rfl: 0   albuterol  (VENTOLIN  HFA) 108 (90 Base) MCG/ACT inhaler, Inhale 1-2 puffs into the lungs every 6 (six) hours as needed for wheezing or shortness of breath., Disp: 8 g, Rfl: 0   ALPRAZolam  (XANAX ) 0.25 MG tablet, Take one po 1 hour prior to procedure prn anxiety, Disp: 6 tablet, Rfl: 0   aluminum  chloride (DRYSOL) 20 %  external solution, Apply topically every other day. For sweating. (Patient taking differently: Apply 1 application  topically every other day. For sweating.), Disp: 35 mL, Rfl: 1   APIDRA SOLOSTAR 100 UNIT/ML Solostar Pen, Inject into the skin., Disp: , Rfl:    atorvastatin  (LIPITOR) 40 MG tablet, Take 1 tablet (40 mg total) by mouth every morning., Disp: 90 tablet, Rfl: 3   bisacodyl  (DULCOLAX) 5 MG EC tablet, Take 5 mg by mouth daily as needed for mild constipation., Disp: , Rfl:    Continuous Blood Gluc Sensor (DEXCOM G6 SENSOR) MISC, Apply 1 sensor to the skin every 10 days for continuous glucose monitoring., Disp: , Rfl:    Continuous Blood Gluc Transmit (DEXCOM G6 TRANSMITTER) MISC, Use as directed for continuous glucose monitoring. Reuse transmitter for 90 days then discard and replace., Disp: , Rfl:    folic acid  (FOLVITE ) 1 MG tablet, Take 1 mg by mouth  daily., Disp: , Rfl:    gabapentin  (NEURONTIN ) 100 MG capsule, Take 1 capsule (100 mg total) by mouth 2 (two) times daily., Disp: 60 capsule, Rfl: 0   glucagon 1 MG injection, Inject 1 mg into the skin once as needed (low blood sugar)., Disp: , Rfl:    ibuprofen  (ADVIL ) 400 MG tablet, Take 1 tablet (400 mg total) by mouth every 8 (eight) hours as needed., Disp: 30 tablet, Rfl: 0   insulin  glargine (LANTUS  SOLOSTAR) 100 UNIT/ML Solostar Pen, Inject 20 Units into the skin daily. If pump failure, take 20 units, Disp: , Rfl:    insulin  glulisine (APIDRA) 100 UNIT/ML injection, Inject via insulin  pump up to 60 units daily for T1DM, Disp: , Rfl:    linaclotide  (LINZESS ) 145 MCG CAPS capsule, Take 1 capsule (145 mcg total) by mouth daily before breakfast., Disp: 90 capsule, Rfl: 0   methotrexate (RHEUMATREX) 2.5 MG tablet, Take 15 mg by mouth once a week. Take 6 tablets (12.5 mg total) by mouth once a week, Disp: , Rfl:    Multiple Vitamin (MULTI-VITAMINS) TABS, Take 1 tablet by mouth daily., Disp: , Rfl:    ONETOUCH ULTRA test strip, TEST 4 TO 6 TIMES DAILY AS DIRECTED, Disp: , Rfl:    pantoprazole  (PROTONIX ) 40 MG tablet, TAKE 1 TABLET BY MOUTH ONCE DAILY, Disp: 30 tablet, Rfl: 1   promethazine -dextromethorphan (PROMETHAZINE -DM) 6.25-15 MG/5ML syrup, Take 5 mLs by mouth 3 (three) times daily as needed for cough., Disp: 200 mL, Rfl: 0   SUMAtriptan (IMITREX) 100 MG tablet, Take 100 mg by mouth as needed for migraine., Disp: , Rfl:    Urine Glucose-Ketones Test (KETO-DIASTIX) STRP, Use to check urine for ketones with high blood glucose, Disp: , Rfl:   "

## 2024-08-03 NOTE — Patient Instructions (Signed)
 " Tiffany [name], thank you for joining Tiffany CHRISTELLA Belt, NP for today's virtual visit.  While this provider is not your primary care provider (PCP), if your PCP is located in our provider database this encounter information will be shared with them immediately following your visit.   A [facility] MyChart account gives you access to today's visit and all your visits, tests, and labs performed at [facility]  click here if you don't have a [facility] MyChart account or go to [email or web address]  Consent: (Patient) Tiffany [name] provided verbal consent for this virtual visit at the beginning of the encounter.  Current Medications:  Current Outpatient Medications:    azithromycin  (ZITHROMAX ) 250 MG tablet, Take 2 tablets on day 1, then 1 tablet daily on days 2 through 5, Disp: 6 tablet, Rfl: 0   fluticasone  (FLOVENT  HFA) 44 MCG/ACT inhaler, Inhale 2 puffs into the lungs 2 (two) times daily., Disp: 1 each, Rfl: 0   acetaminophen  (TYLENOL ) 325 MG tablet, Take 2 tablets (650 mg total) by mouth every 6 (six) hours as needed for moderate pain (pain score 4-6)., Disp: 30 tablet, Rfl: 0   albuterol  (VENTOLIN  HFA) 108 (90 Base) MCG/ACT inhaler, Inhale 1-2 puffs into the lungs every 6 (six) hours as needed for wheezing or shortness of breath., Disp: 8 g, Rfl: 0   ALPRAZolam  (XANAX ) 0.25 MG tablet, Take one po 1 hour prior to procedure prn anxiety, Disp: 6 tablet, Rfl: 0   aluminum  chloride (DRYSOL) 20 % external solution, Apply topically every other day. For sweating. (Patient taking differently: Apply 1 application  topically every other day. For sweating.), Disp: 35 mL, Rfl: 1   APIDRA SOLOSTAR 100 UNIT/ML Solostar Pen, Inject into the skin., Disp: , Rfl:    atorvastatin  (LIPITOR) 40 MG tablet, Take 1 tablet (40 mg total) by mouth every morning., Disp: 90 tablet, Rfl: 3   bisacodyl  (DULCOLAX) 5 MG EC tablet, Take 5 mg by mouth daily as needed for mild constipation., Disp: , Rfl:     Continuous Blood Gluc Sensor (DEXCOM G6 SENSOR) MISC, Apply 1 sensor to the skin every 10 days for continuous glucose monitoring., Disp: , Rfl:    Continuous Blood Gluc Transmit (DEXCOM G6 TRANSMITTER) MISC, Use as directed for continuous glucose monitoring. Reuse transmitter for 90 days then discard and replace., Disp: , Rfl:    folic acid  (FOLVITE ) 1 MG tablet, Take 1 mg by mouth daily., Disp: , Rfl:    gabapentin  (NEURONTIN ) 100 MG capsule, Take 1 capsule (100 mg total) by mouth 2 (two) times daily., Disp: 60 capsule, Rfl: 0   glucagon 1 MG injection, Inject 1 mg into the skin once as needed (low blood sugar)., Disp: , Rfl:    ibuprofen  (ADVIL ) 400 MG tablet, Take 1 tablet (400 mg total) by mouth every 8 (eight) hours as needed., Disp: 30 tablet, Rfl: 0   insulin  glargine (LANTUS  SOLOSTAR) 100 UNIT/ML Solostar Pen, Inject 20 Units into the skin daily. If pump failure, take 20 units, Disp: , Rfl:    insulin  glulisine (APIDRA) 100 UNIT/ML injection, Inject via insulin  pump up to 60 units daily for T1DM, Disp: , Rfl:    linaclotide  (LINZESS ) 145 MCG CAPS capsule, Take 1 capsule (145 mcg total) by mouth daily before breakfast., Disp: 90 capsule, Rfl: 0   methotrexate (RHEUMATREX) 2.5 MG tablet, Take 15 mg by mouth once a week. Take 6 tablets (12.5 mg total) by mouth once a week, Disp: , Rfl:  Multiple Vitamin (MULTI-VITAMINS) TABS, Take 1 tablet by mouth daily., Disp: , Rfl:    ONETOUCH ULTRA test strip, TEST 4 TO 6 TIMES DAILY AS DIRECTED, Disp: , Rfl:    pantoprazole  (PROTONIX ) 40 MG tablet, TAKE 1 TABLET BY MOUTH ONCE DAILY, Disp: 30 tablet, Rfl: 1   promethazine -dextromethorphan (PROMETHAZINE -DM) 6.25-15 MG/5ML syrup, Take 5 mLs by mouth 3 (three) times daily as needed for cough., Disp: 200 mL, Rfl: 0   SUMAtriptan (IMITREX) 100 MG tablet, Take 100 mg by mouth as needed for migraine., Disp: , Rfl:    Urine Glucose-Ketones Test (KETO-DIASTIX) STRP, Use to check urine for ketones with high blood  glucose, Disp: , Rfl:    Medications ordered in this encounter:  Meds ordered this encounter  Medications   azithromycin  (ZITHROMAX ) 250 MG tablet    Sig: Take 2 tablets on day 1, then 1 tablet daily on days 2 through 5    Dispense:  6 tablet    Refill:  0   fluticasone  (FLOVENT  HFA) 44 MCG/ACT inhaler    Sig: Inhale 2 puffs into the lungs 2 (two) times daily.    Dispense:  1 each    Refill:  0   promethazine -dextromethorphan (PROMETHAZINE -DM) 6.25-15 MG/5ML syrup    Sig: Take 5 mLs by mouth 3 (three) times daily as needed for cough.    Dispense:  200 mL    Refill:  0     *If you need refills on other medications prior to your next appointment, please contact your pharmacy*  Follow-Up: Call back or seek an in-person evaluation if the symptoms worsen or if the condition fails to improve as anticipated.  [facility] [phone]  Other Instructions  Use the albuterol  inhaler 2 puffs every 4-6 hours if needed for shortness of breath or wheezing.   Use the fluticasone  inhaler twice a day, every day, even if your breathing is ok in that moment.   Resume using saline spray or Try using saline irrigation, such as with a neti pot, several times a day while you are sick. Many neti pots come with salt packets premeasured to use to make saline. If you use your own salt, make sure it is kosher salt or sea salt (don't use table salt as it has iodine in it and you don't need that in your nose). Use distilled water  to make saline. If you mix your own saline using your own salt, the recipe is 1/4 teaspoon salt in 1 cup warm water . Using saline irrigation can help prevent and treat sinus infections.   If you are getting worse, not better, please be checked in person. If you are short of breath and the inhaler isn't helping, please be checked in person and if the shortness of breath is severe, call 911.    If you have been instructed to have an in-person evaluation today at a local  Urgent Care facility, please use the link below. It will take you to a list of all of our available [facility], including address, phone number and hours of operation. Please do not delay care.  [facility]  If you or a family member do not have a primary care provider, use the link below to schedule a visit and establish care. When you choose a [facility] primary care physician or advanced practice provider, you gain a long-term partner in health. Find a Primary Care Provider  Learn more about [facility]'s in-office and virtual  care options: [facility] - Get Care Now  "

## 2024-08-04 ENCOUNTER — Other Ambulatory Visit: Payer: Self-pay | Admitting: Emergency Medicine

## 2024-08-12 ENCOUNTER — Ambulatory Visit (INDEPENDENT_AMBULATORY_CARE_PROVIDER_SITE_OTHER): Admitting: Nurse Practitioner

## 2024-08-12 ENCOUNTER — Encounter: Payer: Self-pay | Admitting: Nurse Practitioner

## 2024-08-12 ENCOUNTER — Ambulatory Visit: Payer: Self-pay

## 2024-08-12 VITALS — BP 140/70 | HR 104 | Resp 18 | Ht 67.0 in | Wt 159.0 lb

## 2024-08-12 DIAGNOSIS — J40 Bronchitis, not specified as acute or chronic: Secondary | ICD-10-CM

## 2024-08-12 DIAGNOSIS — J0140 Acute pansinusitis, unspecified: Secondary | ICD-10-CM

## 2024-08-12 MED ORDER — AMOXICILLIN-POT CLAVULANATE 875-125 MG PO TABS: 1.0000 | ORAL_TABLET | Freq: Two times a day (BID) | ORAL | 0 refills | Status: DC

## 2024-08-12 MED ORDER — TRELEGY ELLIPTA 100-62.5-25 MCG/ACT IN AEPB: 1.0000 | INHALATION_SPRAY | Freq: Every day | RESPIRATORY_TRACT | Status: AC

## 2024-08-12 NOTE — Telephone Encounter (Signed)
Patient has been evaluated

## 2024-08-12 NOTE — Telephone Encounter (Signed)
 FYI Only or Action Required?: FYI only for provider: appointment scheduled on today 08/12/24.  Patient was last seen in primary care on 08/03/2024 by Richad Jon HERO, NP.  Called Nurse Triage reporting Sinusitis.  Symptoms began several weeks ago.  Interventions attempted: Prescription medications: albuterol  and Azithromycin .  Symptoms are: unchanged.  Triage Disposition: See Physician Within 24 Hours  Patient/caregiver understands and will follow disposition?: Yes  Summary: [id number]: Chest tightening due to not having proper inhaler at this time   Reason for Triage: promethazine -dextromethorphan (PROMETHAZINE -DM) 6.25-15 MG/5ML syrup, fluticasone  (FLOVENT  HFA) 44 MCG/ACT inhaler, and albuterol  (VENTOLIN  HFA) 108 (90 Base) MCG/ACT inhaler (prohibited by pcp) went to do preop and was denied surgery due to the congestion and mucus in lungs, pt states she is still having the chest tightness because she doesn't inhaler, and needed to know if she can also get antibiotic     Reason for Disposition  [1] Taking antibiotic > 72 hours (3 days) AND [2] sinus pain not improved  Answer Assessment - Initial Assessment Questions Pt has been seen x 2 for sinus infection and pt has completed Z-pack and been using albuterol  inhaler and doesn't feel like chest congestion improving. She is still coughing up a lot of yellow thick mucus and the Albuterol  doesn't relieve the chest tightness. Pt went to [facility] to pick up Flovent  that was prescribed by [facility] but they wouldn't let her p/u. Advised pt to come in for re-eval. Pt agreed, appt scheduled today 240 with [facility].   1. ANTIBIOTIC: What antibiotic are you taking? How many times a day?     Completed Azithromycin  2. ONSET: When was the antibiotic started?     Completed abx 4-5 days ago  3. PAIN: How bad is the pain?   (Scale 0-10; or none, mild, moderate or severe)     6/10 4. FEVER: Do you have a fever? If Yes, ask: What is it, how was it  measured, and when did it start?      no 5. SYMPTOMS: Are there any other symptoms you're concerned about? If Yes, ask: When did it start?     Congestion and coughing of mucus yellowish tinge, thick  Protocols used: Sinus Infection on Antibiotic Follow-up Call-[facility]

## 2024-08-12 NOTE — Progress Notes (Signed)
 "  BP (!) 140/70   Pulse (!) 104   Resp 18   Ht 5' 7 (1.702 m)   Wt 159 lb (72.1 kg)   SpO2 96%   BMI 24.90 kg/m    Subjective:    Patient ID: Tiffany [name], female    DOB: [birth year], 54 y.o.   MRN: [id number]  HPI: Tiffany [name] is a 54 y.o. female  Chief Complaint  Patient presents with   Nasal Congestion    Pt has been seen x 2 for sinus infection and pt has completed Z-pack and been using albuterol  inhaler and doesn't feel like chest congestion improving. She is still coughing up a lot of yellow thick mucus and the Albuterol  doesn't relieve the chest tightness.    Discussed the use of AI scribe software for clinical note transcription with the patient, who gave verbal consent to proceed.  History of Present Illness Tiffany [name] is a 54 year old female with type one diabetes who presents with sinusitis and cough.  Upper respiratory symptoms - Sinusitis and cough present for approximately fifteen days - Cough productive of large amounts of yellow, thick mucus - Significant chest congestion and tightness - Shortness of breath and pain with deep inhalation - No fever  Prior evaluation and treatment - Initial urgent care visit on July 30, 2024 - Follow-up video visit on August 03, 2024 - Diagnosed with bronchitis at urgent care - Prescribed azithromycin  (Z-Pak), completed course without improvement - Chest x-ray performed at urgent care showed bronchitis, no pneumonia  Medication use and response - Albuterol  inhaler used, no relief of chest congestion or tightness - Prescribed steroid inhaler, unable to obtain due to prescription issue - Prefers to avoid steroids due to concerns about blood glucose control - Mucinex used to thin mucus, has used two bottles  Diabetes management considerations - Type 1 diabetes - Expresses concern about steroid use due to potential impact on blood sugar levels         08/12/2024    2:34 PM 04/25/2024   11:10 AM 05/28/2023    11:20 AM  Depression screen PHQ 2/9  Decreased Interest 0 3 3  Down, Depressed, Hopeless 0 3 2  PHQ - 2 Score 0 6 5  Altered sleeping 0 3 1  Tired, decreased energy 0 3 2  Change in appetite 0 1 1  Feeling bad or failure about yourself  0 1 2  Trouble concentrating 0 0 3  Moving slowly or fidgety/restless 0 1 3  Suicidal thoughts 0 0 0  PHQ-9 Score 0 15  17   Difficult doing work/chores Not difficult at all Extremely dIfficult Somewhat difficult     Data saved with a previous flowsheet row definition    Relevant past medical, surgical, family and social history reviewed and updated as indicated. Interim medical history since our last visit reviewed. Allergies and medications reviewed and updated.  Review of Systems  Ten systems reviewed and is negative except as mentioned in HPI      Objective:      BP (!) 140/70   Pulse (!) 104   Resp 18   Ht 5' 7 (1.702 m)   Wt 159 lb (72.1 kg)   SpO2 96%   BMI 24.90 kg/m    Wt Readings from Last 3 Encounters:  08/12/24 159 lb (72.1 kg)  04/25/24 156 lb (70.8 kg)  12/03/23 157 lb 3.2 oz (71.3 kg)    Physical Exam GENERAL: Alert, cooperative,  well developed, no acute distress. HEENT: Normocephalic, normal oropharynx, moist mucous membranes. CHEST: Clear to auscultation bilaterally, no wheezes, rhonchi, or crackles. CARDIOVASCULAR: Normal heart rate and rhythm, S1 and S2 normal without murmurs. ABDOMEN: Soft, non-tender, non-distended, without organomegaly, normal bowel sounds. EXTREMITIES: No cyanosis or edema. NEUROLOGICAL: Cranial nerves grossly intact, moves all extremities without gross motor or sensory deficit.  Results for orders placed or performed during the hospital encounter of 07/30/24  POC Covid19/Flu A&B Antigen   Collection Time: 07/30/24 12:53 PM  Result Value Ref Range   Influenza A Antigen, POC Negative Negative   Influenza B Antigen, POC Negative Negative   Covid Antigen, POC Negative Negative           Assessment & Plan:   Problem List Items Addressed This Visit   None Visit Diagnoses       Bronchitis    -  Primary   Relevant Medications   amoxicillin -clavulanate (AUGMENTIN ) 875-125 MG tablet   Fluticasone -Umeclidin-Vilant (TRELEGY ELLIPTA ) 100-62.5-25 MCG/ACT AEPB     Acute non-recurrent pansinusitis       Relevant Medications   amoxicillin -clavulanate (AUGMENTIN ) 875-125 MG tablet        Assessment and Plan Assessment & Plan Acute bronchitis and acute sinusitis Persistent symptoms despite azithromycin  and albuterol . Symptoms include chest tightness, productive cough with yellow mucus, and shortness of breath. Previous chest x-ray indicated bronchitis, no pneumonia. Concerns about steroid use due to type 1 diabetes. Augmentin  prescribed for broader antibiotic coverage. Trelegy inhaler provided to manage symptoms, with instructions to rinse mouth after use. Mucinex recommended to thin mucus and facilitate expectoration. Advised to avoid dairy to prevent mucus thickening. Expected improvement in 24-48 hours with treatment. - Prescribed Augmentin  for 10 days. - Provided sample of Trelegy inhaler, instructed to use one puff once daily and rinse mouth after use. - Recommended Mucinex 1200 mg, one in the morning and one at night. - Advised to drink plenty of water  and avoid dairy. - Instructed to send a message with updates on condition.  Recommend taking zyrtec, flonase , mucinex, vitamin d, vitamin c, and zinc. Push fluids and get rest.          Follow up plan: Return if symptoms worsen or fail to improve. "

## 2024-08-17 ENCOUNTER — Ambulatory Visit: Payer: Self-pay

## 2024-08-17 NOTE — Telephone Encounter (Signed)
 Called and spoke with pt following Manuelita, Bj's conversation.  Pt reports decreased urine output and dark color x 3 days and dyspnea with ambulation and chest pressure/left arm pain.  Pt was encouraged to go to ED for evaluation but declined due to so many people having the flu/COVID in the ED.  She reported if I get one of those viruses I won't make it.  Pt reports increasing her fluids today and has seen some improvement in her urine.  Has been drinking Pedialyte and liquid IV.  Pt asked to be scheduled to see provider in office.  Writer again stressed that the symptoms she was describing were worriesome and that we would prefer her be evaluated in ED.  Pt again declined.  Scheduled for an appointment with PCP  08/19/24 @ 10:40p.  Pt given ED precautions, pt verbalized understanding.

## 2024-08-17 NOTE — Telephone Encounter (Signed)
 FYI Only or Action Required?: Action required by provider: clinical question for provider and update on patient condition.  Patient was last seen in primary care on 08/12/2024 by Gareth Mliss FALCON, FNP.  Called Nurse Triage reporting Urine Output.  Symptoms began several days ago.  Interventions attempted: Prescription medications: augmentin .  Symptoms are: unchanged.  Triage Disposition: See Physician Within 24 Hours  Patient/caregiver understands and will follow disposition?: Yes     Message from Macario HERO sent at 08/17/2024 12:59 PM EST  Reason for Triage: Patient called said she is still feeling weak, dehydrated and is not feeling like herself.     Reason for Disposition  Urinating more frequently than usual (i.e., frequency) OR new-onset of the feeling of an urgent need to urinate (i.e., urgency)    Dark urine x 2 days  Answer Assessment - Initial Assessment Questions Pt called to report brown urine x 2 days. Pt reports minimal h/a (hx of migraines), dizziness. No fever, no pain, no abd cramping, no leg cramps or burning/pain with urination. Pt has drank water , liquid IV and 2 bottles of Pedialyte. Pt reports she is producing urine and going to the bathroom but urine is not lightening. No new medications other than Augmentin  from recent visit. Pt unsure if she needs lab work, discussed ED for slow IV hydration but pt would like to discuss with PCP. No appts in clinic today and this RN is not comfortable scheduling appt for tomorrow with alternative provider or PCP in available appt Friday. Discussed if dizziness continues or any leg cramps start, pt is to have someone drive her to ED for slow IV hydration. Pt to continue PO fluids. Please advise.     1. SYMPTOM: What's the main symptom you're concerned about? (e.g., frequency, incontinence)     Dark urine; pt reports brown urine x 2 days. No pain, no cramps, no burning with urination, no fever, no cloudiness   2. ONSET: When did  the  symptoms  start?     X 2 days ago   3. PAIN: Is there any pain? If Yes, ask: How bad is it? (Scale: 1-10; mild, moderate, severe)     No   4. CAUSE: What do you think is causing the symptoms?     Pt unsure if she is dehydrated   5. OTHER SYMPTOMS: Do you have any other symptoms? (e.g., blood in urine, fever, flank pain, pain with urination)     No  Protocols used: Urinary Symptoms-A-AH

## 2024-08-17 NOTE — Telephone Encounter (Signed)
 NOTED

## 2024-08-17 NOTE — Telephone Encounter (Signed)
 Spoke with pt. Sates that she is not feeling well. Reports decreased urine output, dehydration, feeling weak. I offered the pt an appointment on 08/19/24 at 1040. She declined. I then recommended that the pt be seen in the ED or at an urgent care. She again declined and stated, I need [name] to call me. I feel like my care isn't being taken seriously. She also stated that she called in the morning to our office and was told that there were 3 providers who had appointments tomorrow but an appointment was not scheduled. I do not see any documentation from where she spoke to anyone this morning; the only documentation we have is from when she spoke to the triage nurse at 1315 today.  Message will be routed to our clinical supervisor.

## 2024-08-18 ENCOUNTER — Other Ambulatory Visit: Payer: Self-pay

## 2024-08-18 ENCOUNTER — Emergency Department (HOSPITAL_COMMUNITY)

## 2024-08-18 ENCOUNTER — Observation Stay (HOSPITAL_COMMUNITY)
Admission: EM | Admit: 2024-08-18 | Discharge: 2024-08-20 | Disposition: A | Attending: Emergency Medicine | Admitting: Emergency Medicine

## 2024-08-18 DIAGNOSIS — R7989 Other specified abnormal findings of blood chemistry: Secondary | ICD-10-CM | POA: Diagnosis present

## 2024-08-18 DIAGNOSIS — I309 Acute pericarditis, unspecified: Secondary | ICD-10-CM | POA: Diagnosis not present

## 2024-08-18 DIAGNOSIS — E785 Hyperlipidemia, unspecified: Secondary | ICD-10-CM | POA: Diagnosis not present

## 2024-08-18 DIAGNOSIS — I249 Acute ischemic heart disease, unspecified: Secondary | ICD-10-CM | POA: Diagnosis not present

## 2024-08-18 DIAGNOSIS — E109 Type 1 diabetes mellitus without complications: Secondary | ICD-10-CM | POA: Diagnosis not present

## 2024-08-18 DIAGNOSIS — E782 Mixed hyperlipidemia: Secondary | ICD-10-CM | POA: Diagnosis not present

## 2024-08-18 DIAGNOSIS — Z794 Long term (current) use of insulin: Secondary | ICD-10-CM | POA: Diagnosis not present

## 2024-08-18 DIAGNOSIS — Z79899 Other long term (current) drug therapy: Secondary | ICD-10-CM | POA: Diagnosis not present

## 2024-08-18 DIAGNOSIS — R079 Chest pain, unspecified: Principal | ICD-10-CM | POA: Diagnosis present

## 2024-08-18 DIAGNOSIS — B3323 Viral pericarditis: Secondary | ICD-10-CM

## 2024-08-18 DIAGNOSIS — R0789 Other chest pain: Principal | ICD-10-CM | POA: Insufficient documentation

## 2024-08-18 DIAGNOSIS — Z8739 Personal history of other diseases of the musculoskeletal system and connective tissue: Secondary | ICD-10-CM | POA: Diagnosis not present

## 2024-08-18 LAB — BASIC METABOLIC PANEL WITH GFR
Anion gap: 11 (ref 5–15)
BUN: 16 mg/dL (ref 6–20)
CO2: 30 mmol/L (ref 22–32)
Calcium: 9.7 mg/dL (ref 8.9–10.3)
Chloride: 97 mmol/L — ABNORMAL LOW (ref 98–111)
Creatinine, Ser: 0.83 mg/dL (ref 0.44–1.00)
GFR, Estimated: >60 mL/min
Glucose, Bld: 175 mg/dL — ABNORMAL HIGH (ref 70–99)
Potassium: 4.8 mmol/L (ref 3.5–5.1)
Sodium: 138 mmol/L (ref 135–145)

## 2024-08-18 LAB — CBC
HCT: 39.9 % (ref 36.0–46.0)
Hemoglobin: 12.6 g/dL (ref 12.0–15.0)
MCH: 29.2 pg (ref 26.0–34.0)
MCHC: 31.6 g/dL (ref 30.0–36.0)
MCV: 92.4 fL (ref 80.0–100.0)
Platelets: 322 K/uL (ref 150–400)
RBC: 4.32 MIL/uL (ref 3.87–5.11)
RDW: 13.1 % (ref 11.5–15.5)
WBC: 10.9 K/uL — ABNORMAL HIGH (ref 4.0–10.5)
nRBC: 0 % (ref 0.0–0.2)

## 2024-08-18 LAB — RESP PANEL BY RT-PCR (RSV, FLU A&B, COVID)  RVPGX2
Influenza A by PCR: NEGATIVE
Influenza B by PCR: NEGATIVE
Resp Syncytial Virus by PCR: NEGATIVE
SARS Coronavirus 2 by RT PCR: NEGATIVE

## 2024-08-18 LAB — D-DIMER, QUANTITATIVE: D-Dimer, Quant: <0.27 ug{FEU}/mL (ref 0.00–0.50)

## 2024-08-18 LAB — TROPONIN T, HIGH SENSITIVITY
Troponin T High Sensitivity: 279 ng/L (ref 0–19)
Troponin T High Sensitivity: 244 ng/L (ref 0–19)

## 2024-08-18 MED ORDER — FENTANYL CITRATE (PF) 50 MCG/ML IJ SOSY
25.0000 ug | PREFILLED_SYRINGE | INTRAMUSCULAR | Status: DC | PRN
  Administered 2024-08-19: 25 ug via INTRAVENOUS
  Filled 2024-08-18: qty 1

## 2024-08-18 MED ORDER — SODIUM CHLORIDE 0.9 % IV BOLUS
1000.0000 mL | Freq: Once | INTRAVENOUS | Status: AC
  Administered 2024-08-18: 1000 mL via INTRAVENOUS

## 2024-08-18 MED ORDER — ASPIRIN 81 MG PO CHEW
324.0000 mg | CHEWABLE_TABLET | Freq: Once | ORAL | Status: AC
  Administered 2024-08-18: 324 mg via ORAL
  Filled 2024-08-18: qty 4

## 2024-08-18 NOTE — ED Triage Notes (Signed)
 Pt arrived POV from home with intermittent centralized CP that radiates to her back and left arm. Pt endorses SHOB, nausea and dizziness.

## 2024-08-18 NOTE — ED Triage Notes (Addendum)
 Pt c/o chest pain x 2 days, associated with fever and chills. Pain radiates to her back and left arm. Pt reports ongoing flu-like symptoms x 21 days

## 2024-08-18 NOTE — H&P (Signed)
 " History and Physical      Montgomery L Depasquale FMW:[id number] DOB: [birth year] DOA: 08/18/2024; DOS: 08/18/2024  PCP: Corwin Antu, FNP  Patient coming from: home   I have personally briefly reviewed patient's old medical records in [facility]  Chief Complaint: CP  HPI: Tiffany [name] is a 54 y.o. female with medical history significant for rheumatoid arthritis on methotrexate, type 1 diabetes mellitus, hyperlipidemia, who is admitted to [facility] on 08/18/2024 with chest pain after presenting from home to [facility] ED complaining of chest pain.   The patient reports that she had been experiencing approximately 3 weeks of rhinitis, rhinorrhea, mild shortness of breath, cough, generalized myalgias, all of which have subsequently resolved.  However, over the last 3 days, she notes new onset sharp left-sided chest discomfort, which she states that is worse with exertion and improves with rest.  It is nonpositional, nonpleuritic.  No known history of coronary artery disease.  Per chart review, no prior echocardiogram on file.  No hemoptysis.  Denies any associated palpitations, diaphoresis.    ED Course:  Vital signs in the ED were notable for the following: Afebrile; heart rates in the 80s to 90s; oxygen saturation 98 to 100% on room air.  Labs were notable for the following: BMP notable for the following: Bicarbonate 30, anion gap 11, creatinine 0.83, glucose 175.  CRP 7.3, ESR 36.  Open cell count 10,900.  D-dimer less than 0.27.  COVID, influenza, RSV PCR were all negative.  Initial troponin 279, with repeat value trending down to 244.  Per my interpretation, EKG in ED demonstrated the following: Sinus rhythm with heart rate 96, normal intervals, no evidence of T wave or ST changes, no evidence of ST elevation.  Imaging in the ED, per corresponding formal radiology read, was notable for the following: 2 view chest x-ray showed no evidence of acute cardiopulmonary process, including no  evidence of infiltrate, edema, effusion, or pneumothorax.  EDP discussed with on call cardiology, Dr. Orlando, who suspects myocarditis/pericarditis and recommends echocardiogram in the morning.  However, given mildly elevated troponin, with multiple CAD risk factors, Dr. Orlando also recommends initiation of heparin  drip. Cardiology to formally consult.   While in the ED, the following were administered: Full dose aspirin  x 1, heparin  drip, normal saline x 1 L bolus.  Subsequently, the patient was admitted for further evaluation management of presenting chest pain.     Review of Systems: As per HPI otherwise 10 point review of systems negative.   Past Medical History:  Diagnosis Date   Arthritis    knees, lower back right side   Connective tissue disease    Depression    Diabetes mellitus    Diabetes mellitus type 1, uncontrolled, insulin  dependent 04/03/2017   Fibromyalgia 12/02/2017   G6PD deficiency 10/26/2017   Gastroparesis    Headache(784.0)    Heart murmur    as a child - no problem as adult   History of kidney stones    no surgery   Hyperlipidemia    Lupus    Migraines 06/05/2020   Rheumatoid arthritis (HCC) 06/05/2020   Sjogren's syndrome without extraglandular involvement 02/24/2018    Past Surgical History:  Procedure Laterality Date   CESAREAN SECTION  1994, 2006   x 2   COLONOSCOPY WITH PROPOFOL  N/A 03/16/2013   Procedure: COLONOSCOPY WITH PROPOFOL ;  Surgeon: Elsie Cree, MD;  Location: [facility];  Service: Endoscopy;  Laterality: N/A;   HYSTEROSCOPY WITH D & C  N/A 07/11/2013   Procedure: DILATATION AND CURETTAGE /HYSTEROSCOPY;  Surgeon: Dickie DELENA Carder, MD;  Location: [facility] ORS;  Service: Gynecology;  Laterality: N/A;   left surgery shoulder     ROBOTIC ASSISTED LAPAROSCOPIC LYSIS OF ADHESION N/A 07/11/2013   Procedure: ROBOTIC ASSISTED LAPAROSCOPIC LYSIS OF ADHESIONS ; REPAIR OF UTERINE DEHISENCE;  Surgeon: Dickie DELENA Carder, MD;  Location: [facility] ORS;   Service: Gynecology;  Laterality: N/A;   TUBAL LIGATION      Social History:  reports that she has never smoked. She has never used smokeless tobacco. She reports that she does not drink alcohol  and does not use drugs.   Allergies[1]  Family History  Problem Relation Age of Onset   Lung cancer Mother        or other primary? mets d. 27   Ovarian cancer Mother        dx late 24s   Diabetes Sister    Arthritis Sister    Hyperlipidemia Sister    Alzheimer's disease Maternal Grandmother    Breast cancer Half-Sister 32       d. 28; mat half sister    Family history reviewed and not pertinent    Prior to Admission medications  Medication Sig Start Date End Date Taking? Authorizing Provider  acetaminophen  (TYLENOL ) 325 MG tablet Take 2 tablets (650 mg total) by mouth every 6 (six) hours as needed for moderate pain (pain score 4-6). 07/30/24   Christopher Savannah, PA-C  albuterol  (VENTOLIN  HFA) 108 (90 Base) MCG/ACT inhaler Inhale 1-2 puffs into the lungs every 6 (six) hours as needed for wheezing or shortness of breath. 07/30/24   Christopher Savannah, PA-C  ALPRAZolam  (XANAX ) 0.25 MG tablet Take one po 1 hour prior to procedure prn anxiety 04/25/24   [name], [name], FNP  aluminum  chloride (DRYSOL) 20 % external solution Apply topically every other day. For sweating. Patient taking differently: Apply 1 application  topically every other day. For sweating. 11/11/21   Velma Raisin, MD  amoxicillin -clavulanate (AUGMENTIN ) 875-125 MG tablet Take 1 tablet by mouth 2 (two) times daily. 08/12/24   Pender, Julie F, FNP  APIDRA SOLOSTAR 100 UNIT/ML Solostar Pen Inject into the skin. 05/29/20   [provider]  atorvastatin  (LIPITOR) 40 MG tablet Take 1 tablet (40 mg total) by mouth every morning. 03/26/21   Velma Raisin, MD  bisacodyl  (DULCOLAX) 5 MG EC tablet Take 5 mg by mouth daily as needed for mild constipation.    [provider]  Continuous Blood Gluc Sensor (DEXCOM G6 SENSOR) MISC Apply 1  sensor to the skin every 10 days for continuous glucose monitoring. 05/10/19   [provider]  Continuous Blood Gluc Transmit (DEXCOM G6 TRANSMITTER) MISC Use as directed for continuous glucose monitoring. Reuse transmitter for 90 days then discard and replace. 05/10/19   [provider]  Fluticasone -Umeclidin-Vilant (TRELEGY ELLIPTA ) 100-62.5-25 MCG/ACT AEPB Inhale 1 puff into the lungs daily. 08/12/24   Pender, Julie F, FNP  folic acid  (FOLVITE ) 1 MG tablet Take 1 mg by mouth daily. 12/27/19   [provider]  gabapentin  (NEURONTIN ) 100 MG capsule Take 1 capsule (100 mg total) by mouth 2 (two) times daily. 09/16/22   Leonce Katz, DO  glucagon 1 MG injection Inject 1 mg into the skin once as needed (low blood sugar). 12/13/15   [provider]  ibuprofen  (ADVIL ) 400 MG tablet Take 1 tablet (400 mg total) by mouth every 8 (eight) hours as needed. 07/30/24   Christopher Savannah, PA-C  insulin  glargine (LANTUS  SOLOSTAR) 100 UNIT/ML Solostar Pen Inject 20 Units into the skin daily. If pump failure, take 20 units 03/30/20   [provider]  insulin  glulisine (APIDRA) 100 UNIT/ML injection Inject via insulin  pump up to 60 units daily for T1DM 04/05/20   [provider]  linaclotide  (LINZESS ) 145 MCG CAPS capsule Take 1 capsule (145 mcg total) by mouth daily before breakfast. 07/17/23   Corwin Antu, FNP  methotrexate (RHEUMATREX) 2.5 MG tablet Take 15 mg by mouth once a week. Take 6 tablets (12.5 mg total) by mouth once a week 03/09/20   [provider]  Multiple Vitamin (MULTI-VITAMINS) TABS Take 1 tablet by mouth daily.    [provider]  ONETOUCH ULTRA test strip TEST 4 TO 6 TIMES DAILY AS DIRECTED 11/20/20   [provider]  pantoprazole  (PROTONIX ) 40 MG tablet TAKE 1 TABLET BY MOUTH ONCE DAILY 07/29/22   Lorin Norris, MD  promethazine -dextromethorphan (PROMETHAZINE -DM) 6.25-15 MG/5ML syrup Take 5 mLs by mouth 3 (three) times daily  as needed for cough. 08/03/24   Kabbe, Angela M, NP  SUMAtriptan (IMITREX) 100 MG tablet Take 100 mg by mouth as needed for migraine. 05/14/20   [provider]  Urine Glucose-Ketones Test [facility]) STRP Use to check urine for ketones with high blood glucose 06/16/17   [provider]     Objective    Physical Exam: Vitals:   08/18/24 1847 08/18/24 2218 08/18/24 2319  BP: 136/64 (!) 154/75   Pulse: 89 97   Resp: 18 (!) 31   Temp: 98.4 F (36.9 C)  98.1 F (36.7 C)  TempSrc:   Oral  SpO2: 98% 100%     General: appears to be stated age; alert, oriented Skin: warm, dry, no rash Head:  AT/[place] Mouth:  Oral mucosa membranes appear moist, normal dentition Neck: supple; trachea midline Heart:  RRR; did not appreciate any M/R/G Lungs: CTAB, did not appreciate any wheezes, rales, or rhonchi Abdomen: + BS; soft, ND, NT Extremities: no peripheral edema, no muscle wasting       Labs on Admission: I have personally reviewed following labs and imaging studies  CBC: Recent Labs  Lab 08/18/24 2007  WBC 10.9*  HGB 12.6  HCT 39.9  MCV 92.4  PLT 322   Basic Metabolic Panel: Recent Labs  Lab 08/18/24 2007  NA 138  K 4.8  CL 97*  CO2 30  GLUCOSE 175*  BUN 16  CREATININE 0.83  CALCIUM  9.7   GFR: Estimated Creatinine Clearance: 75.4 mL/min (by C-G formula based on SCr of 0.83 mg/dL). Liver Function Tests: No results for input(s): AST, ALT, ALKPHOS, BILITOT, PROT, ALBUMIN in the last 168 hours. No results for input(s): LIPASE, AMYLASE in the last 168 hours. No results for input(s): AMMONIA in the last 168 hours. Coagulation Profile: No results for input(s): INR, PROTIME in the last 168 hours. Cardiac Enzymes: No results for input(s): CKTOTAL, CKMB, CKMBINDEX, TROPONINI in the last 168 hours. BNP (last 3 results) No results for input(s): PROBNP in the last 8760 hours. HbA1C: No results for input(s): HGBA1C in the last  72 hours. CBG: No results for input(s): GLUCAP in the last 168 hours. Lipid Profile: No results for input(s): CHOL, HDL, LDLCALC, TRIG, CHOLHDL, LDLDIRECT in the last 72 hours. Thyroid  Function Tests: No results for input(s): TSH, T4TOTAL, FREET4, T3FREE, THYROIDAB in the last 72 hours. Anemia Panel: No results for input(s): VITAMINB12, FOLATE, FERRITIN, TIBC, IRON, RETICCTPCT in the last 72 hours. Urine  analysis:    Component Value Date/Time   COLORURINE YELLOW 11/26/2023 0951   APPEARANCEUR CLEAR 11/26/2023 0951   APPEARANCEUR Clear 12/02/2022 0901   LABSPEC 1.020 11/26/2023 0951   PHURINE 7.0 11/26/2023 0951   GLUCOSEU TRACE (A) 11/26/2023 0951   HGBUR NEGATIVE 11/26/2023 0951   BILIRUBINUR negative 11/26/2023 0930   BILIRUBINUR negative 07/17/2023 1032   BILIRUBINUR Negative 12/02/2022 0901   KETONESUR NEGATIVE 11/26/2023 0951   KETONESUR negative 11/26/2023 0930   PROTEINUR NEGATIVE 11/26/2023 0951   UROBILINOGEN 0.2 11/26/2023 0930   UROBILINOGEN 0.2 08/27/2013 0226   NITRITE Negative 11/26/2023 0930   NITRITE negative 07/17/2023 1032   NITRITE Negative 12/02/2022 0901   NITRITE NEGATIVE 05/03/2022 0410   LEUKOCYTESUR Trace (A) 11/26/2023 0930   LEUKOCYTESUR Negative 12/02/2022 0901   LEUKOCYTESUR NEGATIVE 05/03/2022 0410    Radiological Exams on Admission: DG Chest 2 View Result Date: 08/18/2024 EXAM: 2 VIEW(S) XRAY OF THE CHEST 08/18/2024 08:25:00 PM COMPARISON: 07/30/2024 CLINICAL HISTORY: Shortness of breath and chest pain. FINDINGS: LUNGS AND PLEURA: No focal pulmonary opacity. No pleural effusion. No pneumothorax. HEART AND MEDIASTINUM: No acute abnormality of the cardiac and mediastinal silhouettes. BONES AND SOFT TISSUES: No acute osseous abnormality. IMPRESSION: 1. No acute cardiopulmonary abnormality. Electronically signed by: Pinkie Pebbles MD 08/18/2024 08:28 PM EST RP Workstation: [id number]      Assessment/Plan    Principal Problem:   Chest pain Active Problems:   Type 1 diabetes mellitus (HCC)   Elevated troponin   History of rheumatoid arthritis   HLD (hyperlipidemia)       #) Chest pain: 3 days of intermittent left-sided chest discomfort that is exertional, nonpositional.  Given her recent respiratory infection, with viral prodrome as well as elevated CRP and ESR, there is clinical suspicion for myocarditis versus pericarditis.  It is noted that the patient denies any associated positional component to her chest discomfort.  Will pursue echocardiogram the morning.  In addition to recent viral process, she also has history of rheumatoid arthritis on methotrexate, also predisposing her to development of pericarditis.  EKG findings do not appear to be classic in favor of acute pericarditis.  Given her presenting elevation in troponin, which was mild and trending down, we will refrain from NSAIDs at this time.  EKG shows no evidence of acute ischemic changes, including no evidence of STEMI.  Nonelevated D-dimer at 0.27 provides a high negative predictive value against the possibility of acute pulmonary embolism.  Presenting chest x-ray shows no evidence of acute process, including no evidence of pneumothorax.  EDP discussed with on call cardiology, Dr. Orlando, who suspects myocarditis/pericarditis and recommends echocardiogram in the morning.  However, given mildly elevated troponin, with multiple CAD risk factors, Dr. Orlando also recommends initiation of heparin  drip. Cardiology to formally consult.   Status post full dose aspirin .  Plan: Cardiology to formally consult. Heparin  drip, per cardiology.  Echocardiogram the morning.  Monitor on telemetry.  As needed IV fentanyl .  Resume home high intensity atorvastatin .  CMP, CBC in the morning.                   # (Rheumatoid arthritis: On methotrexate q. weekly as an outpatient.  Plan: Continue management per outpatient  rheumatology.                        #) Type 1 diabetes mellitus: Documented history of such, on insulin  pump as an outpatient, with supplies currently present and most recent  CBG noted to be 175, without any evidence of anion gap metabolic acidosis to suggest DKA.  Patient wishes to continue her insulin  pump while in the hospital.  Plan: I have completed the order set for continuation of home insulin  pump, including orders for CBG monitoring before every meal, at bedtime and at 2 AM.  Add on hemoglobin A1c level.                       #) Hyperlipidemia: documented h/o such. On high intensity atorvastatin  as outpatient.   Plan: continue home statin.               DVT prophylaxis: SCD's plus heparin  drip Code Status: Full code Family Communication: none Disposition Plan: Per Rounding Team Consults called:  EDP discussed with on call cardiology, Dr. Orlando, who suspects myocarditis/pericarditis and recommends echocardiogram in the morning.  However, given mildly elevated troponin, with multiple CAD risk factors, Dr. Orlando also recommends initiation of heparin  drip. Cardiology to formally consult. ;  Admission status: obs     I SPENT GREATER THAN 75  MINUTES IN CLINICAL CARE TIME/MEDICAL DECISION-MAKING IN COMPLETING THIS ADMISSION.      Eva NOVAK Romeka Scifres DO [facility]  From 7PM - 7AM   08/18/2024, 11:26 PM         [1]  Allergies Allergen Reactions   Morphine  Itching and Hives   Duloxetine Hcl     Other reaction(s): constipation   Gabapentin      Leg Pain   Morphine  And Codeine Hives and Itching   Tramadol  Hives and Itching   "

## 2024-08-18 NOTE — Telephone Encounter (Signed)
 NOTED I agree with ED precautions, thanks

## 2024-08-18 NOTE — ED Provider Triage Note (Signed)
 Emergency Medicine Provider Triage Evaluation Note  Tiffany [name] , a 54 y.o. female  was evaluated in triage.  Pt complains of pain in the center of her chest that radiates to her back and left arm, has been present over the last 2 to 3 days however she does endorse flulike symptoms over the last 3 weeks.  Does have history of type 1 diabetes, G6PD deficiency, Sjogren syndrome, RA.  Review of Systems  Positive: As above Negative:  Physical Exam  BP 136/64   Pulse 89   Temp 98.4 F (36.9 C)   Resp 18   LMP 09/02/2022   SpO2 98%  Gen:   Awake, no distress   Resp:  Normal effort  MSK:   Moves extremities without difficulty  Other:  Exquisite tenderness to palpation to the center of the chest.  Medical Decision Making  Medically screening exam initiated at 7:33 PM.  Appropriate orders placed.  Tiffany [name] was informed that the remainder of the evaluation will be completed by another provider, this initial triage assessment does not replace that evaluation, and the importance of remaining in the ED until their evaluation is complete.  Initial labs and imaging ordered.   Myriam Dorn [place], [place] 08/18/24 [phone]

## 2024-08-18 NOTE — ED Provider Notes (Signed)
 " [place] [facility] AT [facility] Provider Note   CSN: [id number] Arrival date & time: 08/18/24  1843     Patient presents with: Chest Pain   Tiffany [name] is a 54 y.o. female.   The history is provided by the patient and medical records.  Chest Pain  54 year old female with history of diabetes, G6PD deficiency, lupus, hyperlipidemia, depression, rheumatoid arthritis, Sjogren syndrome, depression, presenting to the ED with chest pain.  She reports she has had 3 weeks of URI symptoms, notably cough, fatigue, and poor appetite.  Husband has also been sick.  They were both tested for covid/flu and everything was negative.  She has completed a couple rounds of antibiotics and seems to get better for a few days but then symptoms recur.  States she was actually feeling better from a respiratory standpoint but has been having chest pain over the past 3 days or so.  Symptoms worse with activity and she does get short of breath with this.  She denies any ongoing fevers.  She is on methotrexate for her rheumatologic conditions.  States she has not had any prior cardiac issues.  A few years back she did have some episodes of chest pain like this and was told it was related to inflammation in her heart.  Prior to Admission medications  Medication Sig Start Date End Date Taking? Authorizing Provider  acetaminophen  (TYLENOL ) 325 MG tablet Take 2 tablets (650 mg total) by mouth every 6 (six) hours as needed for moderate pain (pain score 4-6). 07/30/24   Christopher Savannah, PA-C  albuterol  (VENTOLIN  HFA) 108 (90 Base) MCG/ACT inhaler Inhale 1-2 puffs into the lungs every 6 (six) hours as needed for wheezing or shortness of breath. 07/30/24   Christopher Savannah, PA-C  ALPRAZolam  (XANAX ) 0.25 MG tablet Take one po 1 hour prior to procedure prn anxiety 04/25/24   [name], [name], FNP  aluminum  chloride (DRYSOL) 20 % external solution Apply topically every other day. For sweating. Patient taking  differently: Apply 1 application  topically every other day. For sweating. 11/11/21   Velma Raisin, MD  amoxicillin -clavulanate (AUGMENTIN ) 875-125 MG tablet Take 1 tablet by mouth 2 (two) times daily. 08/12/24   Pender, Julie F, FNP  APIDRA SOLOSTAR 100 UNIT/ML Solostar Pen Inject into the skin. 05/29/20   [provider]  atorvastatin  (LIPITOR) 40 MG tablet Take 1 tablet (40 mg total) by mouth every morning. 03/26/21   Velma Raisin, MD  bisacodyl  (DULCOLAX) 5 MG EC tablet Take 5 mg by mouth daily as needed for mild constipation.    [provider]  Continuous Blood Gluc Sensor (DEXCOM G6 SENSOR) MISC Apply 1 sensor to the skin every 10 days for continuous glucose monitoring. 05/10/19   [provider]  Continuous Blood Gluc Transmit (DEXCOM G6 TRANSMITTER) MISC Use as directed for continuous glucose monitoring. Reuse transmitter for 90 days then discard and replace. 05/10/19   [provider]  Fluticasone -Umeclidin-Vilant (TRELEGY ELLIPTA ) 100-62.5-25 MCG/ACT AEPB Inhale 1 puff into the lungs daily. 08/12/24   Pender, Julie F, FNP  folic acid  (FOLVITE ) 1 MG tablet Take 1 mg by mouth daily. 12/27/19   [provider]  gabapentin  (NEURONTIN ) 100 MG capsule Take 1 capsule (100 mg total) by mouth 2 (two) times daily. 09/16/22   Leonce Katz, DO  glucagon 1 MG injection Inject 1 mg into the skin once as needed (low blood sugar). 12/13/15   [provider]  ibuprofen  (ADVIL ) 400 MG tablet Take  1 tablet (400 mg total) by mouth every 8 (eight) hours as needed. 07/30/24   Christopher Savannah, PA-C  insulin  glargine (LANTUS  SOLOSTAR) 100 UNIT/ML Solostar Pen Inject 20 Units into the skin daily. If pump failure, take 20 units 03/30/20   [provider]  insulin  glulisine (APIDRA) 100 UNIT/ML injection Inject via insulin  pump up to 60 units daily for T1DM 04/05/20   [provider]  linaclotide  (LINZESS ) 145 MCG CAPS capsule Take 1 capsule (145 mcg total)  by mouth daily before breakfast. 07/17/23   Corwin Antu, FNP  methotrexate (RHEUMATREX) 2.5 MG tablet Take 15 mg by mouth once a week. Take 6 tablets (12.5 mg total) by mouth once a week 03/09/20   [provider]  Multiple Vitamin (MULTI-VITAMINS) TABS Take 1 tablet by mouth daily.    [provider]  ONETOUCH ULTRA test strip TEST 4 TO 6 TIMES DAILY AS DIRECTED 11/20/20   [provider]  pantoprazole  (PROTONIX ) 40 MG tablet TAKE 1 TABLET BY MOUTH ONCE DAILY 07/29/22   Lorin Norris, MD  promethazine -dextromethorphan (PROMETHAZINE -DM) 6.25-15 MG/5ML syrup Take 5 mLs by mouth 3 (three) times daily as needed for cough. 08/03/24   Kabbe, Angela M, NP  SUMAtriptan (IMITREX) 100 MG tablet Take 100 mg by mouth as needed for migraine. 05/14/20   [provider]  Urine Glucose-Ketones Test [facility]) STRP Use to check urine for ketones with high blood glucose 06/16/17   [provider]    Allergies: Morphine , Duloxetine hcl, Gabapentin , Morphine  and codeine, and Tramadol    Review of Systems  Cardiovascular:  Positive for chest pain.  All other systems reviewed and are negative.   Updated Vital Signs BP 136/64   Pulse 89   Temp 98.4 F (36.9 C)   Resp 18   LMP 09/02/2022   SpO2 98%   Physical Exam Vitals and nursing note reviewed.  Constitutional:      Appearance: She is well-developed.  HENT:     Head: Normocephalic and atraumatic.  Eyes:     Conjunctiva/sclera: Conjunctivae normal.     Pupils: Pupils are equal, round, and reactive to light.  Cardiovascular:     Rate and Rhythm: Normal rate and regular rhythm.     Heart sounds: Normal heart sounds.  Pulmonary:     Effort: Pulmonary effort is normal.     Breath sounds: Normal breath sounds.  Abdominal:     General: Bowel sounds are normal.     Palpations: Abdomen is soft.  Musculoskeletal:        General: Normal range of motion.     Cervical back: Normal range of motion.   Skin:    General: Skin is warm and dry.  Neurological:     Mental Status: She is alert and oriented to person, place, and time.     (all labs ordered are listed, but only abnormal results are displayed) Labs Reviewed  BASIC METABOLIC PANEL WITH GFR - Abnormal; Notable for the following components:      Result Value   Chloride 97 (*)    Glucose, Bld 175 (*)    All other components within normal limits  CBC - Abnormal; Notable for the following components:   WBC 10.9 (*)    All other components within normal limits  TROPONIN T, HIGH SENSITIVITY - Abnormal; Notable for the following components:   Troponin T High Sensitivity 279 (*)    All other components within normal limits  TROPONIN T, HIGH SENSITIVITY - Abnormal;  Notable for the following components:   Troponin T High Sensitivity 244 (*)    All other components within normal limits  RESP PANEL BY RT-PCR (RSV, FLU A&B, COVID)  [id number]  RESPIRATORY PANEL BY PCR  CULTURE, BLOOD (ROUTINE X 2)  CULTURE, BLOOD (ROUTINE X 2)  D-DIMER, QUANTITATIVE  C-REACTIVE PROTEIN  SEDIMENTATION RATE    EKG: None  Radiology: DG Chest 2 View Result Date: 08/18/2024 EXAM: 2 VIEW(S) XRAY OF THE CHEST 08/18/2024 08:25:00 PM COMPARISON: 07/30/2024 CLINICAL HISTORY: Shortness of breath and chest pain. FINDINGS: LUNGS AND PLEURA: No focal pulmonary opacity. No pleural effusion. No pneumothorax. HEART AND MEDIASTINUM: No acute abnormality of the cardiac and mediastinal silhouettes. BONES AND SOFT TISSUES: No acute osseous abnormality. IMPRESSION: 1. No acute cardiopulmonary abnormality. Electronically signed by: Pinkie Pebbles MD 08/18/2024 08:28 PM EST RP Workstation: [id number]     Procedures   CRITICAL CARE Performed by: Olam CHRISTELLA Slocumb   Total critical care time: 45 minutes  Critical care time was exclusive of separately billable procedures and treating other patients.  Critical care was necessary to treat or prevent imminent or  life-threatening deterioration.  Critical care was time spent personally by me on the following activities: development of treatment plan with patient and/or surrogate as well as nursing, discussions with consultants, evaluation of patient's response to treatment, examination of patient, obtaining history from patient or surrogate, ordering and performing treatments and interventions, ordering and review of laboratory studies, ordering and review of radiographic studies, pulse oximetry and re-evaluation of patient's condition.   Medications Ordered in the ED  sodium chloride  0.9 % bolus 1,000 mL (has no administration in time range)                                    Medical Decision Making Amount and/or Complexity of Data Reviewed Labs: ordered. Radiology: ordered and independent interpretation performed. ECG/medicine tests: ordered and independent interpretation performed.  Risk OTC drugs. Decision regarding hospitalization.   54 year old female here with chest pain for the past 3 days.  She had 3 weeks of preceding URI symptoms.  She is afebrile here.  VSS.  EKG here with some nonspecific ST abnormalities but does not have any overt findings of pericarditis.  Has a mild leukocytosis at 10.9 but no significant electrolyte derangement.  Troponin is 279.  Chest x-ray is clear.  Given her preceding URI symptoms, I question if she may have a myocarditis or similar.  It sounds like she had this several years ago.  Will add on RVP, full viral panel.  Will discuss with cardiology.  10:29 PM Spoke with Dr. Orlando with cardiology-- agrees concerning for myocarditis picture.  He will consult, recommended medical admission.  We have discussed additional labs including blood cultures, inflammatory markers including ESR, CRP.  All have been added on. Has also given full dose ASA, cardiology will decide if heparin is needed once they evaluate.  Patient has been made aware of need for admission and  is agreeable.  Discussed with hospitalist, Dr. Marcene-- will admit for ongoing care.  12:52 AM Cardiology has evaluated-- recommends to go ahead and start heparin as with her rheumatologic condition she is higher risk for NSTEMI but agrees this fits more of a peri/myocarditis picture. Heparin has been ordered per pharmacy.  Final diagnoses:  Chest pain in adult  Elevated troponin    ED Discharge Orders     None  Jarold Olam HERO, PA-C 08/18/24 2328    Lenor Hollering, MD 08/18/24 2343    Jarold Olam HERO, PA-C 08/19/24 [id number]    Lenor Hollering, MD 08/19/24 2021  "

## 2024-08-19 ENCOUNTER — Ambulatory Visit: Admitting: Family

## 2024-08-19 ENCOUNTER — Observation Stay (HOSPITAL_COMMUNITY)

## 2024-08-19 ENCOUNTER — Encounter (HOSPITAL_COMMUNITY): Admission: EM | Disposition: A | Payer: Self-pay | Source: Home / Self Care | Attending: Emergency Medicine

## 2024-08-19 ENCOUNTER — Encounter (HOSPITAL_COMMUNITY): Payer: Self-pay | Admitting: Internal Medicine

## 2024-08-19 DIAGNOSIS — I2510 Atherosclerotic heart disease of native coronary artery without angina pectoris: Secondary | ICD-10-CM

## 2024-08-19 DIAGNOSIS — R7989 Other specified abnormal findings of blood chemistry: Secondary | ICD-10-CM | POA: Diagnosis present

## 2024-08-19 DIAGNOSIS — R079 Chest pain, unspecified: Secondary | ICD-10-CM

## 2024-08-19 DIAGNOSIS — E785 Hyperlipidemia, unspecified: Secondary | ICD-10-CM | POA: Diagnosis present

## 2024-08-19 DIAGNOSIS — I249 Acute ischemic heart disease, unspecified: Secondary | ICD-10-CM

## 2024-08-19 DIAGNOSIS — Z8739 Personal history of other diseases of the musculoskeletal system and connective tissue: Secondary | ICD-10-CM

## 2024-08-19 LAB — COMPREHENSIVE METABOLIC PANEL WITH GFR
ALT: 19 U/L (ref 0–44)
AST: 55 U/L — ABNORMAL HIGH (ref 15–41)
Albumin: 3.7 g/dL (ref 3.5–5.0)
Alkaline Phosphatase: 84 U/L (ref 38–126)
Anion gap: 9 (ref 5–15)
BUN: 13 mg/dL (ref 6–20)
CO2: 27 mmol/L (ref 22–32)
Calcium: 8.7 mg/dL — ABNORMAL LOW (ref 8.9–10.3)
Chloride: 103 mmol/L (ref 98–111)
Creatinine, Ser: 0.73 mg/dL (ref 0.44–1.00)
GFR, Estimated: >60 mL/min
Glucose, Bld: 90 mg/dL (ref 70–99)
Potassium: 4.1 mmol/L (ref 3.5–5.1)
Sodium: 139 mmol/L (ref 135–145)
Total Bilirubin: 0.3 mg/dL (ref 0.0–1.2)
Total Protein: 6.8 g/dL (ref 6.5–8.1)

## 2024-08-19 LAB — RESPIRATORY PANEL BY PCR

## 2024-08-19 LAB — CBC WITH DIFFERENTIAL/PLATELET
Abs Immature Granulocytes: 0.05 K/uL (ref 0.00–0.07)
Basophils Absolute: 0 K/uL (ref 0.0–0.1)
Basophils Relative: 0 %
Eosinophils Absolute: 0 K/uL (ref 0.0–0.5)
Eosinophils Relative: 0 %
HCT: 33.9 % — ABNORMAL LOW (ref 36.0–46.0)
Hemoglobin: 11.1 g/dL — ABNORMAL LOW (ref 12.0–15.0)
Immature Granulocytes: 1 %
Lymphocytes Relative: 28 %
Lymphs Abs: 2.8 K/uL (ref 0.7–4.0)
MCH: 29.8 pg (ref 26.0–34.0)
MCHC: 32.7 g/dL (ref 30.0–36.0)
MCV: 90.9 fL (ref 80.0–100.0)
Monocytes Absolute: 1.1 K/uL — ABNORMAL HIGH (ref 0.1–1.0)
Monocytes Relative: 11 %
Neutro Abs: 6 K/uL (ref 1.7–7.7)
Neutrophils Relative %: 60 %
Platelets: 300 K/uL (ref 150–400)
RBC: 3.73 MIL/uL — ABNORMAL LOW (ref 3.87–5.11)
RDW: 13.1 % (ref 11.5–15.5)
WBC: 10 K/uL (ref 4.0–10.5)
nRBC: 0 % (ref 0.0–0.2)

## 2024-08-19 LAB — HEPARIN LEVEL (UNFRACTIONATED): Heparin Unfractionated: 0.25 [IU]/mL — ABNORMAL LOW (ref 0.30–0.70)

## 2024-08-19 LAB — CBG MONITORING, ED
Glucose-Capillary: 133 mg/dL — ABNORMAL HIGH (ref 70–99)
Glucose-Capillary: 169 mg/dL — ABNORMAL HIGH (ref 70–99)
Glucose-Capillary: 87 mg/dL (ref 70–99)
Glucose-Capillary: 87 mg/dL (ref 70–99)

## 2024-08-19 LAB — SEDIMENTATION RATE: Sed Rate: 36 mm/h — ABNORMAL HIGH (ref 0–22)

## 2024-08-19 LAB — ECHOCARDIOGRAM COMPLETE
Area-P 1/2: 3.61 cm2
MV M vel: 4.78 m/s
MV Peak grad: 91.4 mmHg
Radius: 0.2 cm
S' Lateral: 2.3 cm
Weight: 2543.93 [oz_av]

## 2024-08-19 LAB — C-REACTIVE PROTEIN: CRP: 7.3 mg/dL — ABNORMAL HIGH

## 2024-08-19 LAB — GLUCOSE, CAPILLARY: Glucose-Capillary: 161 mg/dL — ABNORMAL HIGH (ref 70–99)

## 2024-08-19 LAB — HEMOGLOBIN A1C
Hgb A1c MFr Bld: 7.9 % — ABNORMAL HIGH (ref 4.8–5.6)
Mean Plasma Glucose: 180.03 mg/dL

## 2024-08-19 LAB — MAGNESIUM: Magnesium: 2 mg/dL (ref 1.7–2.4)

## 2024-08-19 MED ORDER — LIDOCAINE HCL (PF) 1 % IJ SOLN
INTRAMUSCULAR | Status: AC
  Filled 2024-08-19: qty 30

## 2024-08-19 MED ORDER — DEXTROSE 5 % IV BOLUS
1000.0000 mL | INTRAVENOUS | Status: AC
  Administered 2024-08-19: 1000 mL via INTRAVENOUS

## 2024-08-19 MED ORDER — ACETAMINOPHEN 650 MG RE SUPP: 650.0000 mg | Freq: Four times a day (QID) | RECTAL | Status: DC | PRN

## 2024-08-19 MED ORDER — IOHEXOL 350 MG/ML SOLN
INTRAVENOUS | Status: DC | PRN
  Administered 2024-08-19: 28 mL

## 2024-08-19 MED ORDER — COLCHICINE 0.6 MG PO TABS
0.6000 mg | ORAL_TABLET | Freq: Two times a day (BID) | ORAL | Status: DC
  Administered 2024-08-19 – 2024-08-20 (×2): 0.6 mg via ORAL
  Filled 2024-08-19 (×2): qty 1

## 2024-08-19 MED ORDER — ACETAMINOPHEN 325 MG PO TABS
650.0000 mg | ORAL_TABLET | Freq: Four times a day (QID) | ORAL | Status: DC | PRN
  Administered 2024-08-19: 650 mg via ORAL
  Filled 2024-08-19: qty 2

## 2024-08-19 MED ORDER — ONDANSETRON HCL 4 MG/2ML IJ SOLN
4.0000 mg | Freq: Four times a day (QID) | INTRAMUSCULAR | Status: DC | PRN
  Administered 2024-08-19: 4 mg via INTRAVENOUS
  Filled 2024-08-19: qty 2

## 2024-08-19 MED ORDER — FENTANYL CITRATE (PF) 50 MCG/ML IJ SOSY
50.0000 ug | PREFILLED_SYRINGE | INTRAMUSCULAR | Status: DC | PRN
  Administered 2024-08-19 (×2): 50 ug via INTRAVENOUS
  Filled 2024-08-19 (×2): qty 1

## 2024-08-19 MED ORDER — SODIUM CHLORIDE 0.9% FLUSH: 3.0000 mL | INTRAVENOUS | Status: DC | PRN

## 2024-08-19 MED ORDER — HEPARIN BOLUS VIA INFUSION
4000.0000 [IU] | Freq: Once | INTRAVENOUS | Status: AC
  Administered 2024-08-19: 4000 [IU] via INTRAVENOUS
  Filled 2024-08-19: qty 4000

## 2024-08-19 MED ORDER — HEPARIN SODIUM (PORCINE) 1000 UNIT/ML IJ SOLN
INTRAMUSCULAR | Status: AC
  Filled 2024-08-19: qty 10

## 2024-08-19 MED ORDER — PERFLUTREN LIPID MICROSPHERE
1.0000 mL | INTRAVENOUS | Status: AC | PRN
  Administered 2024-08-19: 3 mL via INTRAVENOUS

## 2024-08-19 MED ORDER — FENTANYL CITRATE (PF) 100 MCG/2ML IJ SOLN
INTRAMUSCULAR | Status: AC
  Filled 2024-08-19: qty 2

## 2024-08-19 MED ORDER — INSULIN PUMP
Freq: Three times a day (TID) | SUBCUTANEOUS | Status: DC
  Filled 2024-08-19: qty 1

## 2024-08-19 MED ORDER — FREE WATER: 500.0000 mL | Freq: Once | Status: DC

## 2024-08-19 MED ORDER — VERAPAMIL HCL 2.5 MG/ML IV SOLN
INTRAVENOUS | Status: DC | PRN
  Administered 2024-08-19: 10 mL via INTRA_ARTERIAL

## 2024-08-19 MED ORDER — SODIUM CHLORIDE 0.9 % IV SOLN: 250.0000 mL | INTRAVENOUS | Status: AC | PRN

## 2024-08-19 MED ORDER — HEPARIN (PORCINE) IN NACL 1000-0.9 UT/500ML-% IV SOLN
INTRAVENOUS | Status: DC | PRN
  Administered 2024-08-19 (×2): 500 mL

## 2024-08-19 MED ORDER — MIDAZOLAM HCL (PF) 2 MG/2ML IJ SOLN
INTRAMUSCULAR | Status: DC | PRN
  Administered 2024-08-19: 2 mg via INTRAVENOUS

## 2024-08-19 MED ORDER — LABETALOL HCL 5 MG/ML IV SOLN: 10.0000 mg | INTRAVENOUS | Status: AC | PRN

## 2024-08-19 MED ORDER — ATORVASTATIN CALCIUM 40 MG PO TABS
40.0000 mg | ORAL_TABLET | Freq: Every morning | ORAL | Status: DC
  Administered 2024-08-20: 40 mg via ORAL
  Filled 2024-08-19 (×2): qty 1

## 2024-08-19 MED ORDER — LIDOCAINE HCL (PF) 1 % IJ SOLN
INTRAMUSCULAR | Status: DC | PRN
  Administered 2024-08-19: 5 mL via INTRADERMAL

## 2024-08-19 MED ORDER — HEPARIN (PORCINE) 25000 UT/250ML-% IV SOLN
1050.0000 [IU]/h | INTRAVENOUS | Status: DC
  Administered 2024-08-19: 900 [IU]/h via INTRAVENOUS
  Filled 2024-08-19: qty 250

## 2024-08-19 MED ORDER — HYDRALAZINE HCL 20 MG/ML IJ SOLN: 10.0000 mg | INTRAMUSCULAR | Status: AC | PRN

## 2024-08-19 MED ORDER — MIDAZOLAM HCL 2 MG/2ML IJ SOLN
INTRAMUSCULAR | Status: AC
  Filled 2024-08-19: qty 2

## 2024-08-19 MED ORDER — FENTANYL CITRATE (PF) 100 MCG/2ML IJ SOLN
INTRAMUSCULAR | Status: DC | PRN
  Administered 2024-08-19: 25 ug via INTRAVENOUS

## 2024-08-19 MED ORDER — MELATONIN 3 MG PO TABS: 3.0000 mg | ORAL_TABLET | Freq: Every evening | ORAL | Status: DC | PRN

## 2024-08-19 MED ORDER — HEPARIN SODIUM (PORCINE) 1000 UNIT/ML IJ SOLN
INTRAMUSCULAR | Status: DC | PRN
  Administered 2024-08-19: 4000 [IU] via INTRAVENOUS

## 2024-08-19 MED ORDER — ASPIRIN 81 MG PO TBEC
81.0000 mg | DELAYED_RELEASE_TABLET | Freq: Every day | ORAL | Status: DC
  Administered 2024-08-19 – 2024-08-20 (×2): 81 mg via ORAL
  Filled 2024-08-19 (×2): qty 1

## 2024-08-19 MED ORDER — INSULIN ASPART 100 UNIT/ML IJ SOLN: 0.0000 [IU] | Freq: Three times a day (TID) | INTRAMUSCULAR | Status: DC

## 2024-08-19 MED ORDER — SODIUM CHLORIDE 0.9% FLUSH
3.0000 mL | Freq: Two times a day (BID) | INTRAVENOUS | Status: DC
  Administered 2024-08-19: 3 mL via INTRAVENOUS

## 2024-08-19 MED ORDER — VERAPAMIL HCL 2.5 MG/ML IV SOLN
INTRAVENOUS | Status: AC
  Filled 2024-08-19: qty 2
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
## 2024-08-19 NOTE — ED Notes (Signed)
 Pt ambulated to bathroom and became [facility]

## 2024-08-19 NOTE — Progress Notes (Signed)
 PHARMACY - ANTICOAGULATION CONSULT NOTE  Pharmacy Consult for Heparin   Indication: chest pain/ACS  Allergies[1]  Vital Signs: Temp: 98.1 F (36.7 C) (01/22 2319) Temp Source: Oral (01/22 2319) BP: 154/75 (01/22 2218) Pulse Rate: 97 (01/22 2218)  Labs: Recent Labs    08/18/24 2007  HGB 12.6  HCT 39.9  PLT 322  CREATININE 0.83    Estimated Creatinine Clearance: 75.4 mL/min (by C-G formula based on SCr of 0.83 mg/dL).   Medical History: Past Medical History:  Diagnosis Date   Arthritis    knees, lower back right side   Connective tissue disease    Depression    Diabetes mellitus    Diabetes mellitus type 1, uncontrolled, insulin  dependent 04/03/2017   Fibromyalgia 12/02/2017   G6PD deficiency 10/26/2017   Gastroparesis    Headache(784.0)    Heart murmur    as a child - no problem as adult   History of kidney stones    no surgery   Hyperlipidemia    Lupus    Migraines 06/05/2020   Rheumatoid arthritis (HCC) 06/05/2020   Sjogren's syndrome without extraglandular involvement 02/24/2018    Assessment: 54 y/o F with chest pain and elevated troponin, starting heparin  for rule out ACS, above labs reviewed, PTA meds reviewed.   Goal of Therapy:  Heparin  level 0.3-0.7 units/ml Monitor platelets by anticoagulation protocol: Yes   Plan:  Heparin  4000 units BOLUS Start heparin  drip at 900 units/hr Heparin  level in 6-8 hours Daily CBC/Heparin  level Monitor for bleeding  Lynwood Mckusick, PharmD, BCPS Clinical Pharmacist Phone: [phone]       [1]  Allergies Allergen Reactions   Morphine  Itching and Hives   Duloxetine Hcl     Other reaction(s): constipation   Gabapentin      Leg Pain   Morphine  And Codeine Hives and Itching   Tramadol  Hives and Itching

## 2024-08-19 NOTE — Progress Notes (Signed)
 Echo attempted; pt was sitting in chair and a bed was not available at this time  Tiffany [name] 11:30 AM 08/19/24

## 2024-08-19 NOTE — ED Notes (Signed)
 Pt given 2 packs of graham crackers with 2 packets of peanut butter

## 2024-08-19 NOTE — Progress Notes (Signed)
 PHARMACY - ANTICOAGULATION CONSULT NOTE  Pharmacy Consult for Heparin   Indication: chest pain/ACS  Allergies[1]  Vital Signs: Temp: 98.5 F (36.9 C) (01/23 1121) Temp Source: Oral (01/23 1121) BP: 119/80 (01/23 1121) Pulse Rate: 81 (01/23 1030)  Labs: Recent Labs    08/18/24 2007 08/19/24 0149 08/19/24 1000  HGB 12.6 11.1*  --   HCT 39.9 33.9*  --   PLT 322 300  --   HEPARINUNFRC  --   --  0.25*  CREATININE 0.83 0.73  --     Estimated Creatinine Clearance: 78.2 mL/min (by C-G formula based on SCr of 0.73 mg/dL).   Medical History: Past Medical History:  Diagnosis Date   Arthritis    knees, lower back right side   Connective tissue disease    Depression    Diabetes mellitus    Diabetes mellitus type 1, uncontrolled, insulin  dependent 04/03/2017   Fibromyalgia 12/02/2017   G6PD deficiency 10/26/2017   Gastroparesis    Headache(784.0)    Heart murmur    as a child - no problem as adult   History of kidney stones    no surgery   Hyperlipidemia    Lupus    Migraines 06/05/2020   Rheumatoid arthritis (HCC) 06/05/2020   Sjogren's syndrome without extraglandular involvement 02/24/2018    Assessment: 54 y/o F with chest pain and elevated troponin, starting heparin  for rule out ACS, above labs reviewed, PTA meds reviewed.   Initial heparin  level 0.25  Goal of Therapy:  Heparin  level 0.3-0.7 units/ml Monitor platelets by anticoagulation protocol: Yes   Plan:  Heparin  to 1050 units / hr Follow up after cath today?  Thank you. Olam Monte, PharmD         [1]  Allergies Allergen Reactions   Morphine  Itching and Hives   Duloxetine Hcl     Other reaction(s): constipation   Gabapentin      Leg Pain   Morphine  And Codeine Hives and Itching   Tramadol  Hives and Itching

## 2024-08-19 NOTE — Progress Notes (Signed)
 Echocardiogram 2D Echocardiogram has been performed.  Tiffany [name] 08/19/2024, 2:23 PM

## 2024-08-19 NOTE — Interval H&P Note (Signed)
 History and Physical Interval Note:  08/19/2024 3:54 PM  Tiffany [name]  has presented today for surgery, with the diagnosis of nstemi.  The various methods of treatment have been discussed with the patient and family. After consideration of risks, benefits and other options for treatment, the patient has consented to  Procedures: LEFT HEART CATH AND CORONARY ANGIOGRAPHY (N/A) as a surgical intervention.  The patient's history has been reviewed, patient examined, no change in status, stable for surgery.  I have reviewed the patient's chart and labs.  Questions were answered to the patient's satisfaction.    Cath Lab Visit (complete for each Cath Lab visit)  Clinical Evaluation Leading to the Procedure:   ACS: Yes.    Non-ACS:    Anginal Classification: CCS III  Anti-ischemic medical therapy: No Therapy  Non-Invasive Test Results: No non-invasive testing performed  Prior CABG: No previous CABG     [name]

## 2024-08-19 NOTE — Plan of Care (Signed)
   Problem: Health Behavior/Discharge Planning: Goal: Ability to manage health-related needs will improve Outcome: Progressing

## 2024-08-19 NOTE — Progress Notes (Signed)
 " PROGRESS NOTE    Tiffany [name]  FMW:[id number] DOB: Dec 04, 1970 DOA: 08/18/2024 PCP: Corwin Antu, FNP   Brief Narrative:  This 54 years old female with PMH significant for rheumatoid arthritis on methotrexate, type 1 diabetes mellitus on insulin  pump, hyperlipidemia presented in the ED with c/o: intermittent chest pain x 3 days.  Patient reports having flulike symptoms 3 weeks ago followed by cough,  mild shortness of breath, generalized myalgia,  all of which have subsequently resolved.  However over the last 3 days she has developed this intermittent sharp left-sided chest discomfort that gets worse with exertion and improves with rest.  She denies any prior history of coronary artery disease.  RSV, influenza, COVID-negative.  D-dimer less than 0.27.  Initial troponin 279 which trended down to 244.  EKG shows sinus rhythm with no evidence of ST-T wave changes.  EDP discussed the case with cardiologist who recommended echocardiogram, suspecting myocarditis/pericarditis due to recent URI followed by chest pain.  Patient was admitted for further evaluation.  Assessment & Plan:   Principal Problem:   Chest pain Active Problems:   Type 1 diabetes mellitus (HCC)   Elevated troponin   History of rheumatoid arthritis   HLD (hyperlipidemia)   ACS (acute coronary syndrome) (HCC)   Left-sided chest pain: NSTEMI: Patient presented with left-sided intermittent chest pain associated with elevated troponins. She did have prior URI symptoms 3 weeks ago, suspected pericarditis/myocarditis. Patient denies any positional component to her chest discomfort. EKG does not appear to be classic for acute pericarditis. Cardiology reevaluated, states symptoms concerning for NSTEMI (exertional chest pain and dyspnea on exertion).  She does have risk factors for ACS (hypertension, hyperlipidemia, diabetes, systemic inflammatory disease).   Her inflammatory markers are also elevated however these are nonspecific  and she has had a recent infection.   Patient is started on heparin  infusion, and it was decided that she needs ischemic evaluation. Patient is scheduled for possible left heart cath today. Continue IV heparin  for now Continue aspirin  and Lipitor. Obtain 2D echo.  Rheumatoid arthritis: She is on methotrexate q. weekly as an outpatient. Continue management as per outpatient rheumatology.  Type 1 diabetes mellitus: Patient does have documented history of insulin  pump as an outpatient.   Continue home insulin  pump monitor CBGs.  Hyperlipidemia: Continue home statin.  DVT prophylaxis: Heparin  IV Code Status: Full code Family Communication: Family at bed side Disposition Plan:     Status is: Observation The patient remains OBS appropriate and will d/c before 2 midnights.   Patient presented with left-sided chest pain,  elevated troponins.  Cardiology decided to start IV heparin  and schedule her for left heart catheter today.  Patient is not medically ready for discharge yet.  Consultants:  Cardiology  Procedures: Scheduled left heart catheter today Antimicrobials:  Anti-infectives (From admission, onward)    None       Subjective: Patient was seen and examined at bedside. Overnight events noted. Patient continued to complain about midsternal chest pain. She is scheduled for Left heart cath today.  Objective: Vitals:   08/19/24 0743 08/19/24 0745 08/19/24 1030 08/19/24 1044  BP:  (!) 112/59 128/68   Pulse:  86 81   Resp:  19 (!) 21   Temp:  98.5 F (36.9 C)  98.2 F (36.8 C)  TempSrc:  Oral  Oral  SpO2: 100% 100% 100%     Intake/Output Summary (Last 24 hours) at 08/19/2024 1058 Last data filed at 08/19/2024 0820 Gross per 24 hour  Intake 94.77 ml  Output --  Net 94.77 ml   There were no vitals filed for this visit.  Examination:  General exam: Appears calm and comfortable, not in any acute distress. Respiratory system: CTA Bilaterally. Respiratory  effort normal.  RR 16 Cardiovascular system: S1 & S2 heard, RRR. No JVD, murmurs, rubs, gallops or clicks.  Gastrointestinal system: Abdomen is non distended, soft and non tender. Normal bowel sounds heard. Central nervous system: Alert and oriented x 3. No focal neurological deficits. Extremities: No edema, no cyanosis, no clubbing. Skin: No rashes, lesions or ulcers Psychiatry: Judgement and insight appear normal. Mood & affect appropriate.   Data Reviewed: I have personally reviewed following labs and imaging studies  CBC: Recent Labs  Lab 08/18/24 2007 08/19/24 0149  WBC 10.9* 10.0  NEUTROABS  --  6.0  HGB 12.6 11.1*  HCT 39.9 33.9*  MCV 92.4 90.9  PLT 322 300   Basic Metabolic Panel: Recent Labs  Lab 08/18/24 2007 08/19/24 0149  NA 138 139  K 4.8 4.1  CL 97* 103  CO2 30 27  GLUCOSE 175* 90  BUN 16 13  CREATININE 0.83 0.73  CALCIUM  9.7 8.7*  MG  --  2.0   GFR: Estimated Creatinine Clearance: 78.2 mL/min (by C-G formula based on SCr of 0.73 mg/dL). Liver Function Tests: Recent Labs  Lab 08/19/24 0149  AST 55*  ALT 19  ALKPHOS 84  BILITOT 0.3  PROT 6.8  ALBUMIN 3.7   No results for input(s): LIPASE, AMYLASE in the last 168 hours. No results for input(s): AMMONIA in the last 168 hours. Coagulation Profile: No results for input(s): INR, PROTIME in the last 168 hours. Cardiac Enzymes: No results for input(s): CKTOTAL, CKMB, CKMBINDEX, TROPONINI in the last 168 hours. BNP (last 3 results) No results for input(s): PROBNP in the last 8760 hours. HbA1C: Recent Labs    08/19/24 0200  HGBA1C 7.9*   CBG: Recent Labs  Lab 08/19/24 0043 08/19/24 0143 08/19/24 0303 08/19/24 0736  GLUCAP 87 87 169* 133*   Lipid Profile: No results for input(s): CHOL, HDL, LDLCALC, TRIG, CHOLHDL, LDLDIRECT in the last 72 hours. Thyroid  Function Tests: No results for input(s): TSH, T4TOTAL, FREET4, T3FREE, THYROIDAB in the last 72  hours. Anemia Panel: No results for input(s): VITAMINB12, FOLATE, FERRITIN, TIBC, IRON, RETICCTPCT in the last 72 hours. Sepsis Labs: No results for input(s): PROCALCITON, LATICACIDVEN in the last 168 hours.  Recent Results (from the past 240 hours)  Resp panel by RT-PCR (RSV, Flu A&B, Covid) Anterior Nasal Swab     Status: None   Collection Time: 08/18/24 10:23 PM   Specimen: Anterior Nasal Swab  Result Value Ref Range Status   SARS Coronavirus 2 by RT PCR NEGATIVE NEGATIVE Final   Influenza A by PCR NEGATIVE NEGATIVE Final   Influenza B by PCR NEGATIVE NEGATIVE Final    Comment: (NOTE) The Xpert Xpress SARS-CoV-2/FLU/RSV plus assay is intended as an aid in the diagnosis of influenza from Nasopharyngeal swab specimens and should not be used as a sole basis for treatment. Nasal washings and aspirates are unacceptable for Xpert Xpress SARS-CoV-2/FLU/RSV testing.  Fact Sheet for Patients: bloggercourse.com  Fact Sheet for Healthcare Providers: seriousbroker.it  This test is not yet approved or cleared by the [place]  FDA and has been authorized for detection and/or diagnosis of SARS-CoV-2 by FDA under an Emergency Use Authorization (EUA). This EUA will remain in effect (meaning this test can be used) for the duration of the  COVID-19 declaration under Section 564(b)(1) of the Act, 21 U.S.C. section 360bbb-3(b)(1), unless the authorization is terminated or revoked.     Resp Syncytial Virus by PCR NEGATIVE NEGATIVE Final    Comment: (NOTE) Fact Sheet for Patients: bloggercourse.com  Fact Sheet for Healthcare Providers: seriousbroker.it  This test is not yet approved or cleared by the [place]  FDA and has been authorized for detection and/or diagnosis of SARS-CoV-2 by FDA under an Emergency Use Authorization (EUA). This EUA will remain in effect (meaning  this test can be used) for the duration of the COVID-19 declaration under Section 564(b)(1) of the Act, 21 U.S.C. section 360bbb-3(b)(1), unless the authorization is terminated or revoked.  Performed at [facility], [address] [place], [place] [address]   Respiratory (~20 pathogens) panel by PCR     Status: None   Collection Time: 08/18/24 10:23 PM   Specimen: Anterior Nasal Swab; Respiratory  Result Value Ref Range Status   Adenovirus NOT DETECTED NOT DETECTED Final   Coronavirus 229E NOT DETECTED NOT DETECTED Final    Comment: (NOTE) The Coronavirus on the Respiratory Panel, DOES NOT test for the novel  Coronavirus (2019 nCoV)    Coronavirus HKU1 NOT DETECTED NOT DETECTED Final   Coronavirus NL63 NOT DETECTED NOT DETECTED Final   Coronavirus OC43 NOT DETECTED NOT DETECTED Final   Metapneumovirus NOT DETECTED NOT DETECTED Final   Rhinovirus / Enterovirus NOT DETECTED NOT DETECTED Final   Influenza A NOT DETECTED NOT DETECTED Final   Influenza B NOT DETECTED NOT DETECTED Final   Parainfluenza Virus 1 NOT DETECTED NOT DETECTED Final   Parainfluenza Virus 2 NOT DETECTED NOT DETECTED Final   Parainfluenza Virus 3 NOT DETECTED NOT DETECTED Final   Parainfluenza Virus 4 NOT DETECTED NOT DETECTED Final   Respiratory Syncytial Virus NOT DETECTED NOT DETECTED Final   Bordetella pertussis NOT DETECTED NOT DETECTED Final   Bordetella Parapertussis NOT DETECTED NOT DETECTED Final   Chlamydophila pneumoniae NOT DETECTED NOT DETECTED Final   Mycoplasma pneumoniae NOT DETECTED NOT DETECTED Final    Comment: Performed at [facility], [address], [place], [place] [address]  Blood culture (routine x 2)     Status: None (Preliminary result)   Collection Time: 08/18/24 10:54 PM   Specimen: BLOOD RIGHT ARM  Result Value Ref Range Status   Specimen Description BLOOD RIGHT ARM  Final   Special Requests   Final    BOTTLES DRAWN AEROBIC ONLY Blood Culture adequate volume    Culture   Final    NO GROWTH < 12 HOURS Performed at [facility], [address], [place], [place] [address]    Report Status PENDING  Incomplete  Blood culture (routine x 2)     Status: None (Preliminary result)   Collection Time: 08/18/24 10:55 PM   Specimen: BLOOD LEFT ARM  Result Value Ref Range Status   Specimen Description BLOOD LEFT ARM  Final   Special Requests   Final    BOTTLES DRAWN AEROBIC ONLY Blood Culture results may not be optimal due to an inadequate volume of blood received in culture bottles   Culture   Final    NO GROWTH < 12 HOURS Performed at [facility], [address] [place], [place] [address]    Report Status PENDING  Incomplete    Radiology Studies: DG Chest 2 View Result Date: 08/18/2024 EXAM: 2 VIEW(S) XRAY OF THE CHEST 08/18/2024 08:25:00 PM COMPARISON: 07/30/2024 CLINICAL  HISTORY: Shortness of breath and chest pain. FINDINGS: LUNGS AND PLEURA: No focal pulmonary opacity. No pleural effusion. No pneumothorax. HEART AND MEDIASTINUM: No acute abnormality of the cardiac and mediastinal silhouettes. BONES AND SOFT TISSUES: No acute osseous abnormality. IMPRESSION: 1. No acute cardiopulmonary abnormality. Electronically signed by: Pinkie Pebbles MD 08/18/2024 08:28 PM EST RP Workstation: [id number]   Scheduled Meds:  aspirin  EC  81 mg Oral Daily   atorvastatin   40 mg Oral q morning   insulin  pump   Subcutaneous TID WC, HS, 0200   Continuous Infusions:  heparin  900 Units/hr (08/19/24 0820)     LOS: 0 days    Time spent: 50 mins    Darcel Dawley, MD [facility]   If 7PM-7AM, please contact night-coverage  "
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
## 2024-08-19 NOTE — ED Notes (Signed)
 Pt refused Lipitor states she has not taken it in a while and does not want it. Provider made aware.

## 2024-08-20 DIAGNOSIS — I300 Acute nonspecific idiopathic pericarditis: Secondary | ICD-10-CM | POA: Diagnosis not present

## 2024-08-20 DIAGNOSIS — R079 Chest pain, unspecified: Secondary | ICD-10-CM | POA: Diagnosis not present

## 2024-08-20 LAB — BASIC METABOLIC PANEL WITH GFR
Anion gap: 12 (ref 5–15)
BUN: 13 mg/dL (ref 6–20)
CO2: 24 mmol/L (ref 22–32)
Calcium: 8.6 mg/dL — ABNORMAL LOW (ref 8.9–10.3)
Chloride: 104 mmol/L (ref 98–111)
Creatinine, Ser: 0.75 mg/dL (ref 0.44–1.00)
GFR, Estimated: >60 mL/min
Glucose, Bld: 125 mg/dL — ABNORMAL HIGH (ref 70–99)
Potassium: 4.4 mmol/L (ref 3.5–5.1)
Sodium: 139 mmol/L (ref 135–145)

## 2024-08-20 LAB — GLUCOSE, CAPILLARY
Glucose-Capillary: 128 mg/dL — ABNORMAL HIGH (ref 70–99)
Glucose-Capillary: 77 mg/dL (ref 70–99)
Glucose-Capillary: 98 mg/dL (ref 70–99)

## 2024-08-20 MED ORDER — DIPHENHYDRAMINE HCL 25 MG PO CAPS
25.0000 mg | ORAL_CAPSULE | Freq: Four times a day (QID) | ORAL | Status: DC | PRN
  Administered 2024-08-20: 25 mg via ORAL
  Filled 2024-08-20: qty 1

## 2024-08-20 MED ORDER — COLCHICINE 0.6 MG PO TABS: 0.6000 mg | ORAL_TABLET | Freq: Two times a day (BID) | ORAL | 0 refills | Status: DC

## 2024-08-20 MED ORDER — PANTOPRAZOLE SODIUM 40 MG PO TBEC
40.0000 mg | DELAYED_RELEASE_TABLET | Freq: Every day | ORAL | Status: DC
  Administered 2024-08-20: 40 mg via ORAL
  Filled 2024-08-20: qty 1

## 2024-08-20 MED ORDER — PRAVASTATIN SODIUM 20 MG PO TABS: 20.0000 mg | ORAL_TABLET | Freq: Every day | ORAL | 0 refills | Status: DC

## 2024-08-20 MED ORDER — ASPIRIN 81 MG PO TBEC: 81.0000 mg | DELAYED_RELEASE_TABLET | Freq: Every day | ORAL | 12 refills | Status: AC

## 2024-08-20 MED ORDER — LIDOCAINE 5 % EX PTCH: 1.0000 | MEDICATED_PATCH | CUTANEOUS | 0 refills | Status: AC

## 2024-08-20 MED ORDER — IBUPROFEN 200 MG PO TABS
800.0000 mg | ORAL_TABLET | Freq: Three times a day (TID) | ORAL | Status: DC
  Administered 2024-08-20 (×2): 800 mg via ORAL
  Filled 2024-08-20 (×2): qty 4

## 2024-08-20 MED ORDER — TRAMADOL HCL 50 MG PO TABS: 50.0000 mg | ORAL_TABLET | Freq: Four times a day (QID) | ORAL | 0 refills | Status: AC | PRN

## 2024-08-20 MED ORDER — TRAMADOL HCL 50 MG PO TABS
50.0000 mg | ORAL_TABLET | Freq: Once | ORAL | Status: AC
  Administered 2024-08-20: 50 mg via ORAL
  Filled 2024-08-20: qty 1

## 2024-08-20 MED ORDER — IBUPROFEN 800 MG PO TABS: 800.0000 mg | ORAL_TABLET | Freq: Three times a day (TID) | ORAL | 0 refills | Status: AC | PRN

## 2024-08-20 NOTE — Plan of Care (Signed)
   Problem: Education: Goal: Knowledge of General Education information will improve Description Including pain rating scale, medication(s)/side effects and non-pharmacologic comfort measures Outcome: Progressing

## 2024-08-20 NOTE — Discharge Summary (Signed)
 " Physician Discharge Summary   Patient: Tiffany [name] MRN: [id number] DOB: [birth year]  Admit date:     08/18/2024  Discharge date: 08/20/24  Discharge Physician: Reyes VEAR Gaw   PCP: Corwin Antu, FNP   Recommendations at discharge:   Patient discharged home with colchicine  to take twice daily for the next 2 weeks.  She was also discharged home with ibuprofen  plus tramadol  for moderate/severe pain relief, respectively. Ambulatory referral for cardiology placed while in house.  She should follow-up with cardiologist in the next 1 to 2 weeks. Also recommended she follow-up with her primary care provider in the next 1 to 2 weeks as well. Patient told me she has adverse effect to atorvastatin  leading to myalgias.  She was recommended to start atorvastatin  due to cath results but patient did not want to take this.  Compromise was to start pravastatin  at low-dose.  Recommended to slowly increase outpatient to maximal dose or if she is able to tolerate this then switch back to atorvastatin /rosuvastatin   Discharge Diagnoses: Principal Problem:   Chest pain Active Problems:   Type 1 diabetes mellitus (HCC)   Elevated troponin   History of rheumatoid arthritis   HLD (hyperlipidemia)   ACS (acute coronary syndrome) (HCC)  Resolved Problems:   * No resolved hospital problems. *  Hospital Course: 54 years old female with PMH significant for rheumatoid arthritis on methotrexate, type 1 diabetes mellitus on insulin  pump, hyperlipidemia presented in the ED with c/o: intermittent chest pain x 3 days.  Patient reports having flulike symptoms 3 weeks ago followed by cough,  mild shortness of breath, generalized myalgia,  all of which have subsequently resolved.  However over the last 3 days she has developed this intermittent sharp left-sided chest discomfort that gets worse with exertion and improves with rest.  She denies any prior history of coronary artery disease.  RSV, influenza,  COVID-negative.  D-dimer less than 0.27.  Initial troponin 279 which trended down to 244.  EKG shows sinus rhythm with no evidence of ST-T wave changes.  EDP discussed the case with cardiologist who recommended echocardiogram, suspecting myocarditis/pericarditis due to recent URI followed by chest pain.  Patient was admitted for further evaluation.  Patient admitted with working diagnosis of pericarditis.  Due to elevated troponins she did undergo left heart cath.  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   Prox Cx to Mid Cx lesion is 50% stenosed.   1st Mrg lesion is 80% stenosed.  TTE in house showed preserved left ventricular ejection fraction.  Patient cleared for discharge from cardiology standpoint 1/24.  She had lots of questions but these were appropriately answered while she was in house.  She was started on atorvastatin  but this was switched to pravastatin  as she has history of myalgias with atorvastatin .  Due to her degree of improvement she was able to be discharged home on 08/20/2024 with colchicine , ibuprofen  and tramadol  for pain relief plus new medications of aspirin  plus pravastatin  for medical management of CAD.   Assessment & Plan: Pericarditis: -Reason for admission. - Left heart cath with results as above.  Started on pravastatin  plus aspirin . - Follow-up with cardiology outpatient.   Rheumatoid arthritis: She is on methotrexate q. weekly as an outpatient. Continue management as per outpatient rheumatology--> continue to follow outpatient   Type 1 diabetes mellitus: Patient does have documented history of  insulin  pump as an outpatient.   Continue home insulin  pump monitor CBGs.   Hyperlipidemia: -Switching to pravastatin  as she was not able to tolerate atorvastatin . - Should follow-up with cardiologist and PCP outpatient to see if she can increase this and then switch to  atorvastatin       Consultants: Cardiology Procedures performed: Left heart cath Disposition: Home Diet recommendation:  Carb modified DISCHARGE MEDICATION: Allergies as of 08/20/2024       Reactions   Morphine  Itching, Hives   Duloxetine Hcl    Other reaction(s): constipation   Gabapentin     Leg Pain   Morphine  And Codeine Hives, Itching   Tramadol  Hives, Itching        Medication List     STOP taking these medications    amoxicillin -clavulanate 875-125 MG tablet Commonly known as: AUGMENTIN        TAKE these medications    acetaminophen  325 MG tablet Commonly known as: Tylenol  Take 2 tablets (650 mg total) by mouth every 6 (six) hours as needed for moderate pain (pain score 4-6).   albuterol  108 (90 Base) MCG/ACT inhaler Commonly known as: VENTOLIN  HFA Inhale 1-2 puffs into the lungs every 6 (six) hours as needed for wheezing or shortness of breath.   aluminum  chloride 20 % external solution Commonly known as: DRYSOL Apply topically every other day. For sweating. What changed:  how much to take when to take this reasons to take this additional instructions   Apidra 100 UNIT/ML injection Generic drug: insulin  glulisine Inject via insulin  pump up to 60 units daily for T1DM   aspirin  EC 81 MG tablet Take 1 tablet (81 mg total) by mouth daily. Swallow whole. Start taking on: August 21, 2024   bisacodyl  5 MG EC tablet Commonly known as: DULCOLAX Take 5 mg by mouth every other day.   colchicine  0.6 MG tablet Take 1 tablet (0.6 mg total) by mouth 2 (two) times daily.   folic acid  1 MG tablet Commonly known as: FOLVITE  Take 1 mg by mouth daily.   gabapentin  100 MG capsule Commonly known as: NEURONTIN  Take 1 capsule (100 mg total) by mouth 2 (two) times daily. What changed:  when to take this reasons to take this   glucagon 1 MG injection Inject 1 mg into the skin once as needed (low blood sugar).   ibuprofen  800 MG tablet Commonly known as:  ADVIL  Take 1 tablet (800 mg total) by mouth 3 (three) times daily as needed for moderate pain (pain score 4-6). What changed:  medication strength how much to take when to take this reasons to take this   Lantus  SoloStar 100 UNIT/ML Solostar Pen Generic drug: insulin  glargine Inject 20 Units into the skin daily. If pump failure, take 20 units   linaclotide  145 MCG Caps capsule Commonly known as: Linzess  Take 1 capsule (145 mcg total) by mouth daily before breakfast. What changed: when to take this   methotrexate 2.5 MG tablet Commonly known as: RHEUMATREX Take 15 mg by mouth once a week. Take 6 tablets (12.5 mg total) by mouth once a week   Multi-Vitamins Tabs Take 1 tablet by mouth daily.   pantoprazole  40 MG tablet Commonly known as: PROTONIX  TAKE 1 TABLET BY MOUTH ONCE DAILY What changed:  when to take this reasons to take this   pravastatin  20 MG tablet Commonly known as: PRAVACHOL  Take 1 tablet (20 mg total) by mouth daily.   promethazine -dextromethorphan 6.25-15 MG/5ML syrup Commonly known as: PROMETHAZINE -DM Take 5 mLs  by mouth 3 (three) times daily as needed for cough.   traMADol  50 MG tablet Commonly known as: Ultram  Take 1 tablet (50 mg total) by mouth every 6 (six) hours as needed for up to 7 days.   Trelegy Ellipta  100-62.5-25 MCG/ACT Aepb Generic drug: Fluticasone -Umeclidin-Vilant Inhale 1 puff into the lungs daily.        Discharge Exam: Filed Weights   08/19/24 1128 08/19/24 1621 08/20/24 0348  Weight: 72.1 kg 72.1 kg 71.3 kg   Gen:  Alert, cooperative patient who appears stated age in no acute distress.  Vital signs reviewed. HEENT:  PERRL, EOMI Heart: Regular rate and rhythm Lungs: Clear to auscultation throughout Chest: Nontender to palpation, chest pain better when she sits forward Abdomen: Nondistended nontender Extremities: No lower extremity edema Neuro: Alert and orient x 4.  Moving all limbs symmetrically. Psych: Anxious  appearing but much more calm after all questions answered.   Condition at discharge: good  The results of significant diagnostics from this hospitalization (including imaging, microbiology, ancillary and laboratory) are listed below for reference.   Imaging Studies: CARDIAC CATHETERIZATION Result Date: 08/19/2024   Mid RCA lesion is 100% stenosed.   Prox LAD to Mid LAD lesion is 40% stenosed.   Prox Cx to Mid Cx lesion is 50% stenosed.   1st Mrg lesion is 80% stenosed. Mild to moderate, eccentric proximal LAD stenosis. The Circumflex is a moderate caliber vessel with moderate mid AV groove stenosis. The small caliber obtuse marginal branch has severe proximal stenosis (vessel is too small for PCI). The large dominant RCA has a chronic mid vessel occlusion. The distal branches fill from left to right collaterals. LVEDP=10 mmHg Recommendations: Her chest pain and troponin elevation are most likely due to pericarditis. Medical management of CAD. Will start colchicine  0.6 mg po BID.   ECHOCARDIOGRAM COMPLETE Result Date: 08/19/2024    ECHOCARDIOGRAM REPORT   Patient Name:   Tiffany [name] Date of Exam: 08/19/2024 Medical Rec #:  [id number]      Height:       67.0 in Accession #:    [id number]     Weight:       159.0 lb Date of Birth:  1970-09-15      BSA:          1.834 m Patient Age:    53 years       BP:           112/59 mmHg Patient Gender: F              HR:           82 bpm. Exam Location:  Inpatient Procedure: 2D Echo, Cardiac Doppler and Color Doppler (Both Spectral and Color            Flow Doppler were utilized during procedure). Indications:    Chest Pain R07.9  History:        Patient has no prior history of Echocardiogram examinations.                 Risk Factors:Diabetes and Dyslipidemia.  Sonographer:    Merlynn Argyle Referring Phys: JUSTIN B HOWERTER  Sonographer Comments: Image acquisition challenging due to respiratory motion. IMPRESSIONS  1. Left ventricular ejection fraction, by estimation, is  55 to 60%. The left ventricle has normal function. The left ventricle has no regional wall motion abnormalities. Left ventricular diastolic parameters were normal.  2. Right ventricular systolic function is normal. The right ventricular size is normal.  There is normal pulmonary artery systolic pressure.  3. The mitral valve is normal in structure. Mild mitral valve regurgitation. No evidence of mitral stenosis.  4. The aortic valve is tricuspid. Aortic valve regurgitation is not visualized. No aortic stenosis is present.  5. The inferior vena cava is normal in size with <50% respiratory variability, suggesting right atrial pressure of 8 mmHg. Comparison(s): No prior Echocardiogram. FINDINGS  Left Ventricle: Left ventricular ejection fraction, by estimation, is 55 to 60%. The left ventricle has normal function. The left ventricle has no regional wall motion abnormalities. Definity  contrast agent was given IV to delineate the left ventricular  endocardial borders. The left ventricular internal cavity size was normal in size. There is no left ventricular hypertrophy. Left ventricular diastolic parameters were normal. Right Ventricle: The right ventricular size is normal. No increase in right ventricular wall thickness. Right ventricular systolic function is normal. There is normal pulmonary artery systolic pressure. The tricuspid regurgitant velocity is 2.08 m/s, and  with an assumed right atrial pressure of 8 mmHg, the estimated right ventricular systolic pressure is 25.3 mmHg. Left Atrium: Left atrial size was normal in size. Right Atrium: Right atrial size was normal in size. Pericardium: There is no evidence of pericardial effusion. Mitral Valve: The mitral valve is normal in structure. Mild mitral valve regurgitation, with posteriorly-directed jet. No evidence of mitral valve stenosis. Tricuspid Valve: The tricuspid valve is normal in structure. Tricuspid valve regurgitation is trivial. No evidence of tricuspid  stenosis. Aortic Valve: The aortic valve is tricuspid. Aortic valve regurgitation is not visualized. No aortic stenosis is present. Pulmonic Valve: The pulmonic valve was not well visualized. Pulmonic valve regurgitation is not visualized. No evidence of pulmonic stenosis. Aorta: The aortic root and ascending aorta are structurally normal, with no evidence of dilitation. Venous: The inferior vena cava is normal in size with less than 50% respiratory variability, suggesting right atrial pressure of 8 mmHg. IAS/Shunts: No atrial level shunt detected by color flow Doppler.  LEFT VENTRICLE PLAX 2D LVIDd:         3.80 cm   Diastology LVIDs:         2.30 cm   LV e' medial:    9.03 cm/s LV PW:         1.00 cm   LV E/e' medial:  12.5 LV IVS:        1.00 cm   LV e' lateral:   12.80 cm/s LVOT diam:     1.80 cm   LV E/e' lateral: 8.8 LV SV:         47 LV SV Index:   25 LVOT Area:     2.54 cm LV IVRT:       70 msec  RIGHT VENTRICLE             IVC RV Basal diam:  3.10 cm     IVC diam: 1.80 cm RV S prime:     10.30 cm/s TAPSE (M-mode): 1.5 cm      PULMONARY VEINS                             Diastolic Velocity: 41.90 cm/s                             S/D Velocity:       1.30  Systolic Velocity:  53.50 cm/s LEFT ATRIUM             Index        RIGHT ATRIUM           Index LA diam:        3.30 cm 1.80 cm/m   RA Area:     10.30 cm LA Vol (A2C):   23.6 ml 12.87 ml/m  RA Volume:   23.20 ml  12.65 ml/m LA Vol (A4C):   24.5 ml 13.36 ml/m LA Biplane Vol: 24.8 ml 13.52 ml/m  AORTIC VALVE LVOT Vmax:   105.00 cm/s LVOT Vmean:  70.000 cm/s LVOT VTI:    0.183 m  AORTA Ao Root diam: 2.60 cm Ao Asc diam:  2.50 cm MITRAL VALVE                  TRICUSPID VALVE MV Area (PHT): 3.61 cm       TR Peak grad:   17.3 mmHg MV Decel Time: 210 msec       TR Vmax:        208.00 cm/s MR Peak grad:    91.4 mmHg MR Mean grad:    69.0 mmHg    SHUNTS MR Vmax:         478.00 cm/s  Systemic VTI:  0.18 m MR Vmean:        399.0 cm/s    Systemic Diam: 1.80 cm MR PISA:         0.25 cm MR PISA Eff ROA: 2 mm MR PISA Radius:  0.20 cm MV E velocity: 113.00 cm/s MV A velocity: 101.00 cm/s MV E/A ratio:  1.12 Emeline Calender Electronically signed by Emeline Calender Signature Date/Time: 08/19/2024/3:56:44 PM    Final    DG Chest 2 View Result Date: 08/18/2024 EXAM: 2 VIEW(S) XRAY OF THE CHEST 08/18/2024 08:25:00 PM COMPARISON: 07/30/2024 CLINICAL HISTORY: Shortness of breath and chest pain. FINDINGS: LUNGS AND PLEURA: No focal pulmonary opacity. No pleural effusion. No pneumothorax. HEART AND MEDIASTINUM: No acute abnormality of the cardiac and mediastinal silhouettes. BONES AND SOFT TISSUES: No acute osseous abnormality. IMPRESSION: 1. No acute cardiopulmonary abnormality. Electronically signed by: Pinkie Pebbles MD 08/18/2024 08:28 PM EST RP Workstation: [id number]   DG Chest 2 View Result Date: 07/30/2024 CLINICAL DATA:  Productive cough EXAM: DG CHEST 2V COMPARISON:  July 22, 2022 FINDINGS: The heart size and mediastinal contours are within normal limits. Both lungs are clear. The visualized skeletal structures are unremarkable. IMPRESSION: No active cardiopulmonary disease. Electronically Signed   By: Lynwood Landy Raddle M.D.   On: 07/30/2024 13:15    Microbiology: Results for orders placed or performed during the hospital encounter of 08/18/24  Resp panel by RT-PCR (RSV, Flu A&B, Covid) Anterior Nasal Swab     Status: None   Collection Time: 08/18/24 10:23 PM   Specimen: Anterior Nasal Swab  Result Value Ref Range Status   SARS Coronavirus 2 by RT PCR NEGATIVE NEGATIVE Final   Influenza A by PCR NEGATIVE NEGATIVE Final   Influenza B by PCR NEGATIVE NEGATIVE Final    Comment: (NOTE) The Xpert Xpress SARS-CoV-2/FLU/RSV plus assay is intended as an aid in the diagnosis of influenza from Nasopharyngeal swab specimens and should not be used as a sole basis for treatment. Nasal washings and aspirates are unacceptable for Xpert Xpress  SARS-CoV-2/FLU/RSV testing.  Fact Sheet for Patients: bloggercourse.com  Fact Sheet for Healthcare Providers: seriousbroker.it  This test is not yet approved or  cleared by the [place]  FDA and has been authorized for detection and/or diagnosis of SARS-CoV-2 by FDA under an Emergency Use Authorization (EUA). This EUA will remain in effect (meaning this test can be used) for the duration of the COVID-19 declaration under Section 564(b)(1) of the Act, 21 U.S.C. section 360bbb-3(b)(1), unless the authorization is terminated or revoked.     Resp Syncytial Virus by PCR NEGATIVE NEGATIVE Final    Comment: (NOTE) Fact Sheet for Patients: bloggercourse.com  Fact Sheet for Healthcare Providers: seriousbroker.it  This test is not yet approved or cleared by the [place]  FDA and has been authorized for detection and/or diagnosis of SARS-CoV-2 by FDA under an Emergency Use Authorization (EUA). This EUA will remain in effect (meaning this test can be used) for the duration of the COVID-19 declaration under Section 564(b)(1) of the Act, 21 U.S.C. section 360bbb-3(b)(1), unless the authorization is terminated or revoked.  Performed at [facility], [address], [place], [place] [address]   Respiratory (~20 pathogens) panel by PCR     Status: None   Collection Time: 08/18/24 10:23 PM   Specimen: Anterior Nasal Swab; Respiratory  Result Value Ref Range Status   Adenovirus NOT DETECTED NOT DETECTED Final   Coronavirus 229E NOT DETECTED NOT DETECTED Final    Comment: (NOTE) The Coronavirus on the Respiratory Panel, DOES NOT test for the novel  Coronavirus (2019 nCoV)    Coronavirus HKU1 NOT DETECTED NOT DETECTED Final   Coronavirus NL63 NOT DETECTED NOT DETECTED Final   Coronavirus OC43 NOT DETECTED NOT DETECTED Final   Metapneumovirus NOT DETECTED NOT DETECTED Final    Rhinovirus / Enterovirus NOT DETECTED NOT DETECTED Final   Influenza A NOT DETECTED NOT DETECTED Final   Influenza B NOT DETECTED NOT DETECTED Final   Parainfluenza Virus 1 NOT DETECTED NOT DETECTED Final   Parainfluenza Virus 2 NOT DETECTED NOT DETECTED Final   Parainfluenza Virus 3 NOT DETECTED NOT DETECTED Final   Parainfluenza Virus 4 NOT DETECTED NOT DETECTED Final   Respiratory Syncytial Virus NOT DETECTED NOT DETECTED Final   Bordetella pertussis NOT DETECTED NOT DETECTED Final   Bordetella Parapertussis NOT DETECTED NOT DETECTED Final   Chlamydophila pneumoniae NOT DETECTED NOT DETECTED Final   Mycoplasma pneumoniae NOT DETECTED NOT DETECTED Final    Comment: Performed at [facility], [address] [place], [place] [address]  Blood culture (routine x 2)     Status: None (Preliminary result)   Collection Time: 08/18/24 10:54 PM   Specimen: BLOOD RIGHT ARM  Result Value Ref Range Status   Specimen Description BLOOD RIGHT ARM  Final   Special Requests   Final    BOTTLES DRAWN AEROBIC ONLY Blood Culture adequate volume   Culture   Final    NO GROWTH 2 DAYS Performed at [facility], [address] [place], [place] [address]    Report Status PENDING  Incomplete  Blood culture (routine x 2)     Status: None (Preliminary result)   Collection Time: 08/18/24 10:55 PM   Specimen: BLOOD LEFT ARM  Result Value Ref Range Status   Specimen Description BLOOD LEFT ARM  Final   Special Requests   Final    BOTTLES DRAWN AEROBIC ONLY Blood Culture results may not be optimal due to an inadequate volume of blood received in culture bottles   Culture   Final    NO GROWTH 2 DAYS Performed at [facility], [address] [place], [place] [address]    Report Status PENDING  Incomplete    Labs: CBC: Recent Labs  Lab 08/18/24 2007 08/19/24 0149  WBC 10.9* 10.0  NEUTROABS  --  6.0  HGB 12.6 11.1*  HCT 39.9 33.9*  MCV 92.4 90.9  PLT 322 300   Basic Metabolic  Panel: Recent Labs  Lab 08/18/24 2007 08/19/24 0149 08/20/24 0303  NA 138 139 139  K 4.8 4.1 4.4  CL 97* 103 104  CO2 30 27 24   GLUCOSE 175* 90 125*  BUN 16 13 13   CREATININE 0.83 0.73 0.75  CALCIUM  9.7 8.7* 8.6*  MG  --  2.0  --    Liver Function Tests: Recent Labs  Lab 08/19/24 0149  AST 55*  ALT 19  ALKPHOS 84  BILITOT 0.3  PROT 6.8  ALBUMIN 3.7   CBG: Recent Labs  Lab 08/19/24 0736 08/19/24 1204 08/20/24 0343 08/20/24 0606 08/20/24 1153  GLUCAP 133* 161* 128* 77 98    Discharge time spent: less than 30 minutes.  Signed: Reyes VEAR Gaw, MD [facility] 08/20/2024 "

## 2024-08-20 NOTE — Progress Notes (Signed)
 " Cardiology Consultation:   Patient ID: Tiffany [name] MRN: [id number]; DOB: 03-Nov-1970  Admit date: 08/18/2024 Date of Consult: 08/20/2024  Primary Care Provider: Corwin Antu, FNP [facility] Cardiologist: Lurena MARLA Red, MD  [facility] Electrophysiologist:  None   Patient Profile:   Tiffany [name] is a 54 y.o. female with CAD, rheumatoid arthritis, SLE, Sjogren syndrome, DM1, HLD, and MDD who presents for chest pain and was found to have stable obstructive CAD not amenable to PCI and likely pericarditis.   Interval Events:   7/10 chest pain constant, improved from admission (9/10). Worse with inspiration. Started on colchicine  for presumed pericarditis following viral illness. S/p coronary angiography 01/23 with MV CAD but not amenable to PCI. TTE with preserved LVEF.   Past Medical History:  Diagnosis Date   Arthritis    knees, lower back right side   Connective tissue disease    Depression    Diabetes mellitus    Diabetes mellitus type 1, uncontrolled, insulin  dependent 04/03/2017   Fibromyalgia 12/02/2017   G6PD deficiency 10/26/2017   Gastroparesis    Headache(784.0)    Heart murmur    as a child - no problem as adult   History of kidney stones    no surgery   Hyperlipidemia    Lupus    Migraines 06/05/2020   Rheumatoid arthritis (HCC) 06/05/2020   Sjogren's syndrome without extraglandular involvement 02/24/2018   Past Surgical History:  Procedure Laterality Date   CESAREAN SECTION  1994, 2006   x 2   COLONOSCOPY WITH PROPOFOL  N/A 03/16/2013   Procedure: COLONOSCOPY WITH PROPOFOL ;  Surgeon: Elsie Cree, MD;  Location: [facility];  Service: Endoscopy;  Laterality: N/A;   HYSTEROSCOPY WITH D & C N/A 07/11/2013   Procedure: DILATATION AND CURETTAGE /HYSTEROSCOPY;  Surgeon: Dickie DELENA Carder, MD;  Location: [facility];  Service: Gynecology;  Laterality: N/A;   left surgery shoulder     ROBOTIC ASSISTED LAPAROSCOPIC LYSIS OF ADHESION N/A 07/11/2013   Procedure:  ROBOTIC ASSISTED LAPAROSCOPIC LYSIS OF ADHESIONS ; REPAIR OF UTERINE DEHISENCE;  Surgeon: Dickie DELENA Carder, MD;  Location: [facility];  Service: Gynecology;  Laterality: N/A;   TUBAL LIGATION      Inpatient Medications: Scheduled Meds:  aspirin  EC  81 mg Oral Daily   atorvastatin   40 mg Oral q morning   colchicine   0.6 mg Oral BID   free water   500 mL Oral Once   free water   500 mL Oral Once   ibuprofen   800 mg Oral TID   insulin  aspart  0-6 Units Subcutaneous TID WC   insulin  pump   Subcutaneous TID WC, HS, 0200   pantoprazole   40 mg Oral Daily   sodium chloride  flush  3 mL Intravenous Q12H   Continuous Infusions:  sodium chloride      PRN Meds: sodium chloride , acetaminophen  **OR** acetaminophen , fentaNYL  (SUBLIMAZE ) injection, melatonin, ondansetron  (ZOFRAN ) IV, sodium chloride  flush  Allergies:   Allergies[1]  Social History:   Social History   Socioeconomic History   Marital status: Married    Spouse name: Darin   Number of children: 2   Years of education: some college   Highest education level: Not on file  Occupational History   Occupation: Unemployed  Tobacco Use   Smoking status: Never   Smokeless tobacco: Never  Vaping Use   Vaping status: Never Used  Substance and Sexual Activity   Alcohol  use: No   Drug use: No   Sexual activity: Yes  Partners: Male    Birth control/protection: Surgical  Other Topics Concern   Not on file  Social History Narrative   06/05/20   From: [place] and moved to [facility] in 2003   Living: with husband, Darin (2002) and daughter   Work: disability - due to rheumatoid arthritis and Diabetes - former producer, television/film/video       Family: 2 daughters Kathlynn and Mariane (2006)      Enjoys: going out to eat      Exercise: joint pain limits activity   Diet: tries to followed diabetic diet      Safety   Seat belts: Yes    Guns: Yes  and secure   Safe in relationships: Yes    Social Drivers of Health   Tobacco Use: Low Risk (08/19/2024)    Patient History    Smoking Tobacco Use: Never    Smokeless Tobacco Use: Never    Passive Exposure: Not on file  Financial Resource Strain: Not on file  Food Insecurity: No Food Insecurity (08/19/2024)   Epic    Worried About Programme Researcher, Broadcasting/film/video in the Last Year: Never true    Ran Out of Food in the Last Year: Never true  Transportation Needs: No Transportation Needs (08/19/2024)   Epic    Lack of Transportation (Medical): No    Lack of Transportation (Non-Medical): No  Physical Activity: Not on file  Stress: Stress Concern Present (03/03/2022)   [employer] of Occupational Health - Occupational Stress Questionnaire    Feeling of Stress : Rather much  Social Connections: Unknown (08/19/2024)   Social Connection and Isolation Panel    Frequency of Communication with Friends and Family: Not on file    Frequency of Social Gatherings with Friends and Family: Not on file    Attends Religious Services: Not on file    Active Member of Clubs or Organizations: Not on file    Attends Banker Meetings: Not on file    Marital Status: Married  Intimate Partner Violence: Not At Risk (08/19/2024)   Epic    Fear of Current or Ex-Partner: No    Emotionally Abused: No    Physically Abused: No    Sexually Abused: No  Depression (PHQ2-9): Low Risk (08/12/2024)   Depression (PHQ2-9)    PHQ-2 Score: 0  Alcohol  Screen: Not on file  Housing: Low Risk (08/19/2024)   Epic    Unable to Pay for Housing in the Last Year: No    Number of Times Moved in the Last Year: 0    Homeless in the Last Year: No  Utilities: Not At Risk (08/19/2024)   Epic    Threatened with loss of utilities: No  Health Literacy: Not on file    Family History:    Family History  Problem Relation Age of Onset   Lung cancer Mother        or other primary? mets d. 5   Ovarian cancer Mother        dx late 34s   Diabetes Sister    Arthritis Sister    Hyperlipidemia Sister    Alzheimer's disease Maternal  Grandmother    Breast cancer Half-Sister 45       d. 29; mat half sister    ROS:  Review of Systems: [y] = yes, [ ]  = no      General: Weight gain [ ] ; Weight loss [ ] ; Anorexia [ ] ; Fatigue [ ] ; Fever [ ] ; Chills [ ] ;  Weakness [ ]    Cardiac: Chest pain/pressure [y]; Resting SOB [ ] ; Exertional SOB [ ] ; Orthopnea [ ] ; Pedal Edema [ ] ; Palpitations [ ] ; Syncope [ ] ; Presyncope [ ] ; Paroxysmal nocturnal dyspnea [ ]    Pulmonary: Cough [ ] ; Wheezing [ ] ; Hemoptysis [ ] ; Sputum [ ] ; Snoring [ ]    GI: Vomiting [ ] ; Dysphagia [ ] ; Melena [ ] ; Hematochezia [ ] ; Heartburn [ ] ; Abdominal pain [ ] ; Constipation [ ] ; Diarrhea [ ] ; BRBPR [ ]    GU: Hematuria [ ] ; Dysuria [ ] ; Nocturia [ ]  Vascular: Pain in legs with walking [ ] ; Pain in feet with lying flat [ ] ; Non-healing sores [ ] ; Stroke [ ] ; TIA [ ] ; Slurred speech [ ] ;   Neuro: Headaches [ ] ; Vertigo [ ] ; Seizures [ ] ; Paresthesias [ ] ;Blurred vision [ ] ; Diplopia [ ] ; Vision changes [ ]    Ortho/Skin: Arthritis [ ] ; Joint pain [ ] ; Muscle pain [ ] ; Joint swelling [ ] ; Back Pain [ ] ; Rash [ ]    Psych: Depression [ ] ; Anxiety [ ]    Heme: Bleeding problems [ ] ; Clotting disorders [ ] ; Anemia [ ]    Endocrine: Diabetes [ ] ; Thyroid  dysfunction [ ]    Physical Exam/Data:   Vitals:   08/20/24 0346 08/20/24 0348 08/20/24 0812 08/20/24 1158  BP: (!) 92/49  (!) 105/59 115/68  Pulse: 74  76   Resp: 20  19 18   Temp: 98.2 F (36.8 C)  97.6 F (36.4 C)   TempSrc: Oral  Oral   SpO2: 98%  93%   Weight:  71.3 kg    Height:       Intake/Output Summary (Last 24 hours) at 08/20/2024 1347 Last data filed at 08/19/2024 1704 Gross per 24 hour  Intake 311.72 ml  Output --  Net 311.72 ml      08/20/2024    3:48 AM 08/19/2024    4:21 PM 08/19/2024   11:28 AM  Last 3 Weights  Weight (lbs) 157 lb 3 oz 158 lb 15.9 oz 158 lb 15.9 oz  Weight (kg) 71.3 kg 72.12 kg 72.12 kg     Body mass index is 24.62 kg/m.  General:  Well nourished, well developed, in no  acute distress, moderate CP  HEENT: normal Cardiac:  normal S1, S2; RRR; no murmur  Lungs:  clear to auscultation bilaterally, no wheezing, rhonchi or rales  Ext: no edema Musculoskeletal:  No deformities, BUE and BLE strength normal and equal Skin: warm and dry  Neuro:  CNs 2-12 intact, no focal abnormalities noted Psych:  Normal affect   ECG (08/18/24, 19:10:55) reviewed with NSR 96, PR 94, QRS 68, QT/c 326/411, no acute ischemic changes  Telemetry:  Telemetry was personally reviewed and demonstrates: NSR 50-70s  Relevant CV Studies:  Coronary angiography Result date: 08/19/24   Mid RCA lesion is 100% stenosed.   Prox LAD to Mid LAD lesion is 40% stenosed.   Prox Cx to Mid Cx lesion is 50% stenosed.   1st Mrg lesion is 80% stenosed. Mild to moderate, eccentric proximal LAD stenosis.  The Circumflex is a moderate caliber vessel with moderate mid AV groove stenosis. The small caliber obtuse marginal branch has severe proximal stenosis (vessel is too small for PCI).  The large dominant RCA has a chronic mid vessel occlusion. The distal branches fill from left to right collaterals.  LVEDP=10 mmHg Recommendations: Her chest pain and troponin elevation are most likely due to pericarditis. Medical management  of CAD. Will start colchicine  0.6 mg po BID.   TTE Result date: 08/19/24  1. Left ventricular ejection fraction, by estimation, is 55 to 60%. The  left ventricle has normal function. The left ventricle has no regional  wall motion abnormalities. Left ventricular diastolic parameters were  normal.   2. Right ventricular systolic function is normal. The right ventricular  size is normal. There is normal pulmonary artery systolic pressure.   3. The mitral valve is normal in structure. Mild mitral valve  regurgitation. No evidence of mitral stenosis.   4. The aortic valve is tricuspid. Aortic valve regurgitation is not  visualized. No aortic stenosis is present.   5. The inferior  vena cava is normal in size with <50% respiratory  variability, suggesting right atrial pressure of 8 mmHg.   Laboratory Data:  Chemistry Recent Labs  Lab 08/18/24 2007 08/19/24 0149 08/20/24 0303  NA 138 139 139  K 4.8 4.1 4.4  CL 97* 103 104  CO2 30 27 24   GLUCOSE 175* 90 125*  BUN 16 13 13   CREATININE 0.83 0.73 0.75  CALCIUM  9.7 8.7* 8.6*  GFRNONAA >60 >60 >60  ANIONGAP 11 9 12     Recent Labs  Lab 08/19/24 0149  PROT 6.8  ALBUMIN 3.7  AST 55*  ALT 19  ALKPHOS 84  BILITOT 0.3   Hematology Recent Labs  Lab 08/18/24 2007 08/19/24 0149  WBC 10.9* 10.0  RBC 4.32 3.73*  HGB 12.6 11.1*  HCT 39.9 33.9*  MCV 92.4 90.9  MCH 29.2 29.8  MCHC 31.6 32.7  RDW 13.1 13.1  PLT 322 300   DDimer  Recent Labs  Lab 08/18/24 2007  DDIMER <0.27   Radiology/Studies:  CARDIAC CATHETERIZATION Result Date: 08/19/2024   Mid RCA lesion is 100% stenosed.   Prox LAD to Mid LAD lesion is 40% stenosed.   Prox Cx to Mid Cx lesion is 50% stenosed.   1st Mrg lesion is 80% stenosed. Mild to moderate, eccentric proximal LAD stenosis. The Circumflex is a moderate caliber vessel with moderate mid AV groove stenosis. The small caliber obtuse marginal branch has severe proximal stenosis (vessel is too small for PCI). The large dominant RCA has a chronic mid vessel occlusion. The distal branches fill from left to right collaterals. LVEDP=10 mmHg Recommendations: Her chest pain and troponin elevation are most likely due to pericarditis. Medical management of CAD. Will start colchicine  0.6 mg po BID.   ECHOCARDIOGRAM COMPLETE Result Date: 08/19/2024    ECHOCARDIOGRAM REPORT   Patient Name:   Carolan L Havlik Date of Exam: 08/19/2024 Medical Rec #:  [id number]      Height:       67.0 in Accession #:    [id number]     Weight:       159.0 lb Date of Birth:  01/05/1971      BSA:          1.834 m Patient Age:    53 years       BP:           112/59 mmHg Patient Gender: F              HR:           82 bpm. Exam  Location:  Inpatient Procedure: 2D Echo, Cardiac Doppler and Color Doppler (Both Spectral and Color            Flow Doppler were utilized during procedure). Indications:    Chest Pain R07.9  History:        Patient has no prior history of Echocardiogram examinations.                 Risk Factors:Diabetes and Dyslipidemia.  Sonographer:    Merlynn Argyle Referring Phys: JUSTIN B HOWERTER  Sonographer Comments: Image acquisition challenging due to respiratory motion. IMPRESSIONS  1. Left ventricular ejection fraction, by estimation, is 55 to 60%. The left ventricle has normal function. The left ventricle has no regional wall motion abnormalities. Left ventricular diastolic parameters were normal.  2. Right ventricular systolic function is normal. The right ventricular size is normal. There is normal pulmonary artery systolic pressure.  3. The mitral valve is normal in structure. Mild mitral valve regurgitation. No evidence of mitral stenosis.  4. The aortic valve is tricuspid. Aortic valve regurgitation is not visualized. No aortic stenosis is present.  5. The inferior vena cava is normal in size with <50% respiratory variability, suggesting right atrial pressure of 8 mmHg. Comparison(s): No prior Echocardiogram. FINDINGS  Left Ventricle: Left ventricular ejection fraction, by estimation, is 55 to 60%. The left ventricle has normal function. The left ventricle has no regional wall motion abnormalities. Definity  contrast agent was given IV to delineate the left ventricular  endocardial borders. The left ventricular internal cavity size was normal in size. There is no left ventricular hypertrophy. Left ventricular diastolic parameters were normal. Right Ventricle: The right ventricular size is normal. No increase in right ventricular wall thickness. Right ventricular systolic function is normal. There is normal pulmonary artery systolic pressure. The tricuspid regurgitant velocity is 2.08 m/s, and  with an assumed right  atrial pressure of 8 mmHg, the estimated right ventricular systolic pressure is 25.3 mmHg. Left Atrium: Left atrial size was normal in size. Right Atrium: Right atrial size was normal in size. Pericardium: There is no evidence of pericardial effusion. Mitral Valve: The mitral valve is normal in structure. Mild mitral valve regurgitation, with posteriorly-directed jet. No evidence of mitral valve stenosis. Tricuspid Valve: The tricuspid valve is normal in structure. Tricuspid valve regurgitation is trivial. No evidence of tricuspid stenosis. Aortic Valve: The aortic valve is tricuspid. Aortic valve regurgitation is not visualized. No aortic stenosis is present. Pulmonic Valve: The pulmonic valve was not well visualized. Pulmonic valve regurgitation is not visualized. No evidence of pulmonic stenosis. Aorta: The aortic root and ascending aorta are structurally normal, with no evidence of dilitation. Venous: The inferior vena cava is normal in size with less than 50% respiratory variability, suggesting right atrial pressure of 8 mmHg. IAS/Shunts: No atrial level shunt detected by color flow Doppler.  LEFT VENTRICLE PLAX 2D LVIDd:         3.80 cm   Diastology LVIDs:         2.30 cm   LV e' medial:    9.03 cm/s LV PW:         1.00 cm   LV E/e' medial:  12.5 LV IVS:        1.00 cm   LV e' lateral:   12.80 cm/s LVOT diam:     1.80 cm   LV E/e' lateral: 8.8 LV SV:         47 LV SV Index:   25 LVOT Area:     2.54 cm LV IVRT:       70 msec  RIGHT VENTRICLE             IVC RV Basal diam:  3.10 cm  IVC diam: 1.80 cm RV S prime:     10.30 cm/s TAPSE (M-mode): 1.5 cm      PULMONARY VEINS                             Diastolic Velocity: 41.90 cm/s                             S/D Velocity:       1.30                             Systolic Velocity:  53.50 cm/s LEFT ATRIUM             Index        RIGHT ATRIUM           Index LA diam:        3.30 cm 1.80 cm/m   RA Area:     10.30 cm LA Vol (A2C):   23.6 ml 12.87 ml/m  RA Volume:    23.20 ml  12.65 ml/m LA Vol (A4C):   24.5 ml 13.36 ml/m LA Biplane Vol: 24.8 ml 13.52 ml/m  AORTIC VALVE LVOT Vmax:   105.00 cm/s LVOT Vmean:  70.000 cm/s LVOT VTI:    0.183 m  AORTA Ao Root diam: 2.60 cm Ao Asc diam:  2.50 cm MITRAL VALVE                  TRICUSPID VALVE MV Area (PHT): 3.61 cm       TR Peak grad:   17.3 mmHg MV Decel Time: 210 msec       TR Vmax:        208.00 cm/s MR Peak grad:    91.4 mmHg MR Mean grad:    69.0 mmHg    SHUNTS MR Vmax:         478.00 cm/s  Systemic VTI:  0.18 m MR Vmean:        399.0 cm/s   Systemic Diam: 1.80 cm MR PISA:         0.25 cm MR PISA Eff ROA: 2 mm MR PISA Radius:  0.20 cm MV E velocity: 113.00 cm/s MV A velocity: 101.00 cm/s MV E/A ratio:  1.12 Emeline Calender Electronically signed by Emeline Calender Signature Date/Time: 08/19/2024/3:56:44 PM    Final    DG Chest 2 View Result Date: 08/18/2024 EXAM: 2 VIEW(S) XRAY OF THE CHEST 08/18/2024 08:25:00 PM COMPARISON: 07/30/2024 CLINICAL HISTORY: Shortness of breath and chest pain. FINDINGS: LUNGS AND PLEURA: No focal pulmonary opacity. No pleural effusion. No pneumothorax. HEART AND MEDIASTINUM: No acute abnormality of the cardiac and mediastinal silhouettes. BONES AND SOFT TISSUES: No acute osseous abnormality. IMPRESSION: 1. No acute cardiopulmonary abnormality. Electronically signed by: Pinkie Pebbles MD 08/18/2024 08:28 PM EST RP Workstation: [id number]   Assessment and Plan:  Durga L Armes is a 54 y.o. female with CAD, rheumatoid arthritis, SLE, Sjogren syndrome, DM1, HLD, and MDD who presents for chest pain and was found to have stable obstructive CAD not amenable to PCI and likely pericarditis.   CAD Pericarditis  S/p coronary angiography without acute thrombotic occlusion or stenosis amenable to revascularization.  With recent viral illness, multiple autoimmune conditions and constant chest pain worse with deep inspiration pericarditis seems most consistent with her current clinical course.  Already  started on colchicine .  Started on ibuprofen  today despite CAD as she was having ongoing significant chest discomfort.  Would plan to continue both ibuprofen  and colchicine  for 2 weeks until symptoms subside.  Started Protonix  while on ibuprofen .  Atorvastatin  and aspirin  will be new medications along with colchicine  and ibuprofen  at discharge. Prescribed protonix  in the past but was taking prn. Would take daily with ibuprofen  for the 2 weeks. Stable for discharge from cardiac perspective if she wants to go home. She was equivocal about this earlier today. She was irritated with the frequent but was wanting to stay for pain control.  I discontinued the frequent glucose checks as she is DM1 and has a meter. If she feels up to leaving today that is fine. If not will continue to pain control her sx.   [facility] will sign off.   The patient is ready for discharge today from a cardiac standpoint. Medication Recommendations: start colchicine  and ibuprofen  along with ASA and atorvastatin , PPI with ibuprofen   Other recommendations (labs, testing, etc): none  Follow up as an outpatient: to be scheduled   For questions or updates, please contact [facility] Please consult [email or web address] for contact info under   Signed, Donnice DELENA Primus, MD  08/20/2024 1:47 PM     [1]  Allergies Allergen Reactions   Morphine  Itching and Hives   Duloxetine Hcl     Other reaction(s): constipation   Gabapentin      Leg Pain   Morphine  And Codeine Hives and Itching   Tramadol  Hives and Itching   "

## 2024-08-21 ENCOUNTER — Encounter (HOSPITAL_COMMUNITY): Payer: Self-pay | Admitting: Cardiovascular Disease

## 2024-08-22 ENCOUNTER — Telehealth: Payer: Self-pay

## 2024-08-22 ENCOUNTER — Ambulatory Visit: Payer: Self-pay | Admitting: Family

## 2024-08-22 NOTE — Transitions of Care (Post Inpatient/ED Visit) (Signed)
 "  08/22/2024  Name: Tiffany [name] MRN: [id number] DOB: [birth year]  Today's TOC FU Call Status: Today's TOC FU Call Status:: Successful TOC FU Call Completed TOC FU Call Complete Date: 08/22/24  Patient's Name and Date of Birth confirmed. Name, DOB  Transition Care Management Follow-up Telephone Call Date of Discharge: 08/20/24 Discharge Facility: [facility]) Type of Discharge: Inpatient Admission Primary Inpatient Discharge Diagnosis:: viral pericarditis How have you been since you were released from the hospital?: Better Any questions or concerns?: No  Items Reviewed: Did you receive and understand the discharge instructions provided?: Yes Medications obtained,verified, and reconciled?: Yes (Medications Reviewed) Any new allergies since your discharge?: No Dietary orders reviewed?: Yes Do you have support at home?: Yes People in Home [RPT]: sibling(s)  Medications Reviewed Today: Medications Reviewed Today     Reviewed by Emmitt Pan, LPN (Licensed Practical Nurse) on 08/22/24 at 1643  Med List Status: <None>   Medication Order Taking? Sig Documenting Provider Last Dose Status Informant  acetaminophen  (TYLENOL ) 325 MG tablet [id number] Yes Take 2 tablets (650 mg total) by mouth every 6 (six) hours as needed for moderate pain (pain score 4-6). Christopher Savannah, PA-C  Active Self, Pharmacy Records  albuterol  (VENTOLIN  HFA) 108 [phone] Base) MCG/ACT inhaler [id number] Yes Inhale 1-2 puffs into the lungs every 6 (six) hours as needed for wheezing or shortness of breath. Christopher Savannah, PA-C  Active Self, Pharmacy Records  aluminum  chloride (DRYSOL) 20 % external solution [id number] Yes Apply topically every other day. For sweating.  Patient taking differently: Apply 1 Application topically daily as needed (Sweating).   Velma Raisin, MD  Active Self, Pharmacy Records  aspirin  EC 81 MG tablet [id number] Yes Take 1 tablet (81 mg total) by mouth daily. Swallow whole. Elpidio Reyes DEL, MD   Active   bisacodyl  (DULCOLAX) 5 MG EC tablet [id number] Yes Take 5 mg by mouth every other day. [provider]  Active Self, Pharmacy Records  colchicine  0.6 MG tablet [id number] Yes Take 1 tablet (0.6 mg total) by mouth 2 (two) times daily. Elpidio Reyes DEL, MD  Active   Fluticasone -Umeclidin-Vilant (TRELEGY ELLIPTA ) 100-62.5-25 MCG/ACT AEPB [id number] Yes Inhale 1 puff into the lungs daily. Gareth Mliss FALCON, FNP  Active Self, Pharmacy Records  folic acid  (FOLVITE ) 1 MG tablet [id number] Yes Take 1 mg by mouth daily. [provider]  Active Self, Pharmacy Records  gabapentin  (NEURONTIN ) 100 MG capsule [id number] Yes Take 1 capsule (100 mg total) by mouth 2 (two) times daily.  Patient taking differently: Take 100 mg by mouth 2 (two) times daily as needed (Pain).   Leonce Katz, DO  Active Self, Pharmacy Records  glucagon 1 MG injection [id number] Yes Inject 1 mg into the skin once as needed (low blood sugar). [provider]  Active Self, Pharmacy Records           Med Note EARLIE, [facility] S   Fri Jan 25, 2016  9:28 PM)    ibuprofen  (ADVIL ) 800 MG tablet [id number] Yes Take 1 tablet (800 mg total) by mouth 3 (three) times daily as needed for moderate pain (pain score 4-6). Elpidio Reyes DEL, MD  Active   insulin  glargine (LANTUS  SOLOSTAR) 100 UNIT/ML Solostar Pen [id number] Yes Inject 20 Units into the skin daily. If pump failure, take 20 units [provider]  Active Self, Pharmacy Records  insulin  glulisine (APIDRA) 100 UNIT/ML injection [id number] Yes Inject via insulin  pump up to 60 units daily for T1DM [provider]  Active  Self, Pharmacy Records           Med Note JACKOLYN WADDELL DEL   Fri Aug 19, 2024  9:25 AM) Has on currently.   lidocaine  (LIDODERM ) 5 % [id number] Yes Place 1 patch onto the skin daily for 10 days. Remove & Discard patch within 12 hours or as directed by MD Elpidio Reyes DEL, MD  Active   linaclotide  (LINZESS ) 145 MCG CAPS capsule  [id number] Yes Take 1 capsule (145 mcg total) by mouth daily before breakfast.  Patient taking differently: Take 145 mcg by mouth every other day.   Corwin Antu, FNP  Active Self, Pharmacy Records  methotrexate [facility]) 2.5 MG tablet [id number] Yes Take 15 mg by mouth once a week. Take 6 tablets (12.5 mg total) by mouth once a week [provider]  Active Self, Pharmacy Records           Med Note JACKOLYN WADDELL DEL   Fri Aug 19, 2024  9:24 AM) Dispense records do not support compliance. Patient confirms taking.   Multiple Vitamin (MULTI-VITAMINS) TABS [id number] Yes Take 1 tablet by mouth daily. [provider]  Active Self, Pharmacy Records           Med Note EARLIE, [facility] S   Fri Jan 25, 2016  9:28 PM)    pantoprazole  (PROTONIX ) 40 MG tablet [id number] Yes TAKE 1 TABLET BY MOUTH ONCE DAILY  Patient taking differently: Take 40 mg by mouth daily as needed (Indigestion).   Lorin Norris, MD  Active Self, Pharmacy Records  pravastatin  (PRAVACHOL ) 20 MG tablet [id number] Yes Take 1 tablet (20 mg total) by mouth daily. Elpidio Reyes DEL, MD  Active   promethazine -dextromethorphan (PROMETHAZINE -DM) 6.25-15 MG/5ML syrup [id number]  Take 5 mLs by mouth 3 (three) times daily as needed for cough.  Patient not taking: Reported on 08/22/2024   Richad Jon HERO, NP  Active Self, Pharmacy Records  traMADol  (ULTRAM ) 50 MG tablet [id number] Yes Take 1 tablet (50 mg total) by mouth every 6 (six) hours as needed for up to 7 days. Elpidio Reyes DEL, MD  Active             Home Care and Equipment/Supplies: Were Home Health Services Ordered?: NA Any new equipment or medical supplies ordered?: NA  Functional Questionnaire: Do you need assistance with bathing/showering or dressing?: No Do you need assistance with meal preparation?: No Do you need assistance with eating?: No Do you have difficulty maintaining continence: No Do you need assistance with getting out of bed/getting out  of a chair/moving?: No Do you have difficulty managing or taking your medications?: No  Follow up appointments reviewed: PCP Follow-up appointment confirmed?: Yes Date of PCP follow-up appointment?: 08/29/24 Follow-up Provider: [facility] Follow-up appointment confirmed?: No Reason Specialist Follow-Up Not Confirmed: Patient has Specialist Provider Number and will Call for Appointment Do you need transportation to your follow-up appointment?: No Do you understand care options if your condition(s) worsen?: Yes-patient verbalized understanding    SIGNATURE Julian Lemmings, LPN [facility] Nurse Health Advisor Direct Dial [phone]  "

## 2024-08-23 LAB — CULTURE, BLOOD (ROUTINE X 2)
Culture: NO GROWTH
Culture: NO GROWTH
Special Requests: ADEQUATE

## 2024-08-23 NOTE — Progress Notes (Unsigned)
 "  Cardiology Office Note:   Date:  08/25/2024  ID:  Tiffany [name], DOB [birth year], MRN [id number] PCP:  Corwin Antu, FNP  [facility] Providers Cardiologist:  Wendel Haws, MD Referring MD: Elpidio Reyes DEL, MD  Chief Complaint/Reason for Referral: Pericarditis ASSESSMENT:    1. Acute pericarditis, unspecified type   2. Coronary artery disease involving native coronary artery of native heart without angina pectoris   3. Type 2 diabetes mellitus without complication, with long-term current use of insulin  (HCC)   4. Hypertension associated with diabetes (HCC)   5. Hyperlipidemia associated with type 2 diabetes mellitus (HCC)   6. Aortic atherosclerosis   7. Rheumatoid arthritis involving multiple sites, unspecified whether rheumatoid factor present (HCC)   8. Sjogren's syndrome without extraglandular involvement   9. Palpitations     PLAN:   In order of problems listed above: Pericarditis: Patient's chest pain syndrome occurred a few weeks after a URI.  Her biomarkers were elevated.  Continue ibuprofen  800 mg 3 times daily for total 2 weeks (February 7).  Continue colchicine  0.6 mg twice daily for total 3 months (April 24).  Check ESR and CRP in 4 months. Coronary artery disease: CTO of right coronary artery.  Continue aspirin  81 mg, pravastatin  20 mg.  Patient is asymptomatic. T2DM: Continue aspirin  81 mg, pravastatin  20 mg; consider SGLT2 inhibitor and losartan in the future Hypertension: Blood pressure at goal today.  Will consider losartan in the future Hyperlipidemia: Continue pravastatin  20 mg daily.  Check lipid panel, LFTs, LP(a) in 4 months Aortic atherosclerosis: Continue aspirin  81 mg, pravastatin  20 mg Rheumatoid arthritis: Continue methotrexate 15 mg once a week Sjogren syndrome: Followed by other providers. Palpitations: Will obtain monitor.            Dispo:  Return in about 5 months (around 01/23/2025).       I spent 42 minutes reviewing all clinical  data during and prior to this visit including all relevant imaging studies, laboratories, clinical information from other health systems and prior notes from both Cardiology and other specialties, interviewing the patient, conducting a complete physical examination, and coordinating care in order to formulate a comprehensive and personalized evaluation and treatment plan.   History of Present Illness:    FOCUSED PROBLEM LIST:   Chest pain syndrome/elevated troponins January 2026 3 weeks after URI  CTO mRCA, 40% proximal to mid LAD, 50% proximal to mid left circumflex, 80% OM1 coronary angiography January 2026 CRP 7.3 some: ESR 36 January 2026 T2DM  On insulin  Hypertension Hyperlipidemia Intolerant of atorvastatin  Aortic atherosclerosis CT renal study 2024 Rheumatoid arthritis  Sjogren syndrome BMI 24.02 August 2024:  Patient consents to use of AI scribe. The patient is here for hospital follow-up.  She was admitted with mild troponin elevation and elevated inflammatory biomarkers.  Coronary angiography demonstrated a CTO of the right coronary artery and mild to moderate disease elsewhere.  She was treated with colchicine  0.6 mg twice daily and ibuprofen  with the latter being for 2 weeks.  She is also started on a PPI.  Since discharge, she experienced a major episode of chest pain yesterday, which improved significantly after taking ibuprofen . She reports that her symptoms are better than before hospitalization, although she still experiences some chest discomfort.  She reports difficulty with physical activity, specifically experiencing palpitations when climbing stairs. Palpitations occur consistently with stair climbing, although they are not present upon waking. She has not been monitoring her blood pressure at home.  Her past  medical history includes diabetes, for which she is on insulin , and rheumatoid arthritis, for which she takes methotrexate. She also has a history of high  cholesterol and is currently on pravastatin , as she could not tolerate atorvastatin  due to side effects. She was started on pravastatin  during her recent hospital stay.     Current Medications: Active Medications[1]   Review of Systems:   Please see the history of present illness.    All other systems reviewed and are negative.     EKGs/Labs/Other Test Reviewed:   EKG: Christopher 2026 sinus rhythm with short PR and nonspecific ST and T wave changes  EKG Interpretation Date/Time:    Ventricular Rate:    PR Interval:    QRS Duration:    QT Interval:    QTC Calculation:   R Axis:      Text Interpretation:          CARDIAC STUDIES: Refer to CV Procedures and Imaging Tabs   Risk Assessment/Calculations:          Physical Exam:   VS:  BP 136/78   Pulse 81   Ht 5' 7 (1.702 m)   Wt 159 lb (72.1 kg)   LMP 09/02/2022   SpO2 95%   BMI 24.90 kg/m        Wt Readings from Last 3 Encounters:  08/25/24 159 lb (72.1 kg)  08/20/24 157 lb 3 oz (71.3 kg)  08/12/24 159 lb (72.1 kg)      GENERAL:  No apparent distress, AOx3 HEENT:  No carotid bruits, +2 carotid impulses, no scleral icterus CAR: RRR no murmurs, gallops, rubs, or thrills RES:  Clear to auscultation bilaterally ABD:  Soft, nontender, nondistended, positive bowel sounds x 4 VASC:  +2 radial pulses, +2 carotid pulses NEURO:  CN 2-12 grossly intact; motor and sensory grossly intact PSYCH:  No active depression or anxiety EXT:  No edema, ecchymosis, or cyanosis  Signed, Jenaye Rickert K Moritz Lever, MD  08/25/2024 10:44 AM    [facility] [address] [place], [place], [place]  [address] Phone: [phone]; Fax: [phone]   Note:  This document was prepared using Dragon voice recognition software and may include unintentional dictation errors.     [1]  Current Meds  Medication Sig   acetaminophen  (TYLENOL ) 325 MG tablet Take 2 tablets (650 mg total) by mouth every 6 (six) hours as needed for  moderate pain (pain score 4-6).   albuterol  (VENTOLIN  HFA) 108 (90 Base) MCG/ACT inhaler Inhale 1-2 puffs into the lungs every 6 (six) hours as needed for wheezing or shortness of breath.   aluminum  chloride (DRYSOL) 20 % external solution Apply topically every other day. For sweating. (Patient taking differently: Apply 1 Application topically daily as needed (Sweating).)   aspirin  EC 81 MG tablet Take 1 tablet (81 mg total) by mouth daily. Swallow whole.   bisacodyl  (DULCOLAX) 5 MG EC tablet Take 5 mg by mouth every other day.   Fluticasone -Umeclidin-Vilant (TRELEGY ELLIPTA ) 100-62.5-25 MCG/ACT AEPB Inhale 1 puff into the lungs daily.   folic acid  (FOLVITE ) 1 MG tablet Take 1 mg by mouth daily.   gabapentin  (NEURONTIN ) 100 MG capsule Take 1 capsule (100 mg total) by mouth 2 (two) times daily. (Patient taking differently: Take 100 mg by mouth 2 (two) times daily as needed (Pain).)   glucagon 1 MG injection Inject 1 mg into the skin once as needed (low blood sugar).   ibuprofen  (ADVIL ) 800 MG tablet Take 1 tablet (  800 mg total) by mouth 3 (three) times daily as needed for moderate pain (pain score 4-6).   insulin  glargine (LANTUS  SOLOSTAR) 100 UNIT/ML Solostar Pen Inject 20 Units into the skin daily. If pump failure, take 20 units   insulin  glulisine (APIDRA) 100 UNIT/ML injection Inject via insulin  pump up to 60 units daily for T1DM   lidocaine  (LIDODERM ) 5 % Place 1 patch onto the skin daily for 10 days. Remove & Discard patch within 12 hours or as directed by MD   linaclotide  (LINZESS ) 145 MCG CAPS capsule Take 1 capsule (145 mcg total) by mouth daily before breakfast. (Patient taking differently: Take 145 mcg by mouth every other day.)   methotrexate (RHEUMATREX) 2.5 MG tablet Take 15 mg by mouth once a week. Take 6 tablets (12.5 mg total) by mouth once a week   Multiple Vitamin (MULTI-VITAMINS) TABS Take 1 tablet by mouth daily.   pantoprazole  (PROTONIX ) 40 MG tablet TAKE 1 TABLET BY MOUTH ONCE  DAILY (Patient taking differently: Take 40 mg by mouth daily as needed (Indigestion).)   [DISCONTINUED] colchicine  0.6 MG tablet Take 1 tablet (0.6 mg total) by mouth 2 (two) times daily.   [DISCONTINUED] pravastatin  (PRAVACHOL ) 20 MG tablet Take 1 tablet (20 mg total) by mouth daily.   "

## 2024-08-25 ENCOUNTER — Encounter: Payer: Self-pay | Admitting: Internal Medicine

## 2024-08-25 ENCOUNTER — Ambulatory Visit

## 2024-08-25 ENCOUNTER — Ambulatory Visit: Attending: Internal Medicine | Admitting: Internal Medicine

## 2024-08-25 VITALS — BP 136/78 | HR 81 | Ht 67.0 in | Wt 159.0 lb

## 2024-08-25 DIAGNOSIS — I152 Hypertension secondary to endocrine disorders: Secondary | ICD-10-CM | POA: Diagnosis not present

## 2024-08-25 DIAGNOSIS — E119 Type 2 diabetes mellitus without complications: Secondary | ICD-10-CM | POA: Diagnosis not present

## 2024-08-25 DIAGNOSIS — E1159 Type 2 diabetes mellitus with other circulatory complications: Secondary | ICD-10-CM | POA: Diagnosis not present

## 2024-08-25 DIAGNOSIS — M3500 Sicca syndrome, unspecified: Secondary | ICD-10-CM

## 2024-08-25 DIAGNOSIS — I2510 Atherosclerotic heart disease of native coronary artery without angina pectoris: Secondary | ICD-10-CM | POA: Diagnosis not present

## 2024-08-25 DIAGNOSIS — E785 Hyperlipidemia, unspecified: Secondary | ICD-10-CM | POA: Diagnosis not present

## 2024-08-25 DIAGNOSIS — M069 Rheumatoid arthritis, unspecified: Secondary | ICD-10-CM | POA: Diagnosis not present

## 2024-08-25 DIAGNOSIS — E1169 Type 2 diabetes mellitus with other specified complication: Secondary | ICD-10-CM | POA: Diagnosis not present

## 2024-08-25 DIAGNOSIS — Z794 Long term (current) use of insulin: Secondary | ICD-10-CM

## 2024-08-25 DIAGNOSIS — I700 Atherosclerosis of aorta: Secondary | ICD-10-CM

## 2024-08-25 DIAGNOSIS — R002 Palpitations: Secondary | ICD-10-CM

## 2024-08-25 DIAGNOSIS — I309 Acute pericarditis, unspecified: Secondary | ICD-10-CM

## 2024-08-25 MED ORDER — PRAVASTATIN SODIUM 20 MG PO TABS: 20.0000 mg | ORAL_TABLET | Freq: Every day | ORAL | 0 refills | Status: AC

## 2024-08-25 MED ORDER — COLCHICINE 0.6 MG PO TABS: 0.6000 mg | ORAL_TABLET | Freq: Two times a day (BID) | ORAL | 0 refills | Status: AC

## 2024-08-25 NOTE — Patient Instructions (Addendum)
 Medication Instructions:    Continue with Ibuprofen  until Feb 7 ,2026 then stop  Continue with Colchicine  ) 0.6 mg twice a day  until November 18, 2024 then stop    *If you need a refill on your cardiac medications before your next appointment, please call your pharmacy*   Lab Work: in 4 months - fasting CRP SED RATE LIPID LIVER PANEL LPa  If you have labs (blood work) drawn today and your tests are completely normal, you will receive your results only by: MyChart Message (if you have MyChart) OR A paper copy in the mail If you have any lab test that is abnormal or we need to change your treatment, we will call you to review the results.   Testing/Procedures: Your physician has recommended that you wear a holter monitor 3 day  Zio . Holter monitors are medical devices that record the hearts electrical activity. Doctors most often use these monitors to diagnose arrhythmias. Arrhythmias are problems with the speed or rhythm of the heartbeat. The monitor is a small, portable device. You can wear one while you do your normal daily activities. This is usually used to diagnose what is causing palpitations/syncope (passing out).    Follow-Up: At [facility], you and your health needs are our priority.  As part of our continuing mission to provide you with exceptional heart care, we have created designated Provider Care Teams.  These Care Teams include your primary Cardiologist (physician) and Advanced Practice Providers (APPs -  Physician Assistants and Nurse Practitioners) who all work together to provide you with the care you need, when you need it.     Your next appointment:   5 month(s)  The format for your next appointment:   In Person  Provider:   Arun K Thukkani, MD   Other Instructions   GEOFFRY HEWS- Long Term Monitor Instructions  Your physician has requested you wear a ZIO patch monitor for 3 days.  This is a single patch monitor. [employer] supplies one patch monitor per  enrollment. Additional stickers are not available. Please do not apply patch if you will be having a Nuclear Stress Test,  Echocardiogram, Cardiac CT, MRI, or Chest Xray during the period you would be wearing the  monitor. The patch cannot be worn during these tests. You cannot remove and re-apply the  ZIO XT patch monitor.  Your ZIO patch monitor will be mailed 3 day USPS to your address on file. It may take 3-5 days  to receive your monitor after you have been enrolled.  Once you have received your monitor, please review the enclosed instructions. Your monitor  has already been registered assigning a specific monitor serial # to you.  Billing and Patient Assistance Program Information  We have supplied [employer] with any of your insurance information on file for billing purposes. [employer] offers a sliding scale Patient Assistance Program for patients that do not have  insurance, or whose insurance does not completely cover the cost of the ZIO monitor.  You must apply for the Patient Assistance Program to qualify for this discounted rate.  To apply, please call [employer] at [phone], select option 4, select option 2, ask to apply for  Patient Assistance Program. Meredeth will ask your household income, and how many people  are in your household. They will quote your out-of-pocket cost based on that information.  [employer] will also be able to set up a 69-month, interest-free payment plan if needed.  Applying the monitor   Shave  hair from upper left chest.  Hold abrader disc by orange tab. Rub abrader in 40 strokes over the upper left chest as  indicated in your monitor instructions.  Clean area with 4 enclosed alcohol  pads. Let dry.  Apply patch as indicated in monitor instructions. Patch will be placed under collarbone on left  side of chest with arrow pointing upward.  Rub patch adhesive wings for 2 minutes. Remove white label marked 1. Remove the white  label marked 2. Rub patch  adhesive wings for 2 additional minutes.  While looking in a mirror, press and release button in center of patch. A small green light will  flash 3-4 times. This will be your only indicator that the monitor has been turned on.  Do not shower for the first 24 hours. You may shower after the first 24 hours.  Press the button if you feel a symptom. You will hear a small click. Record Date, Time and  Symptom in the Patient Logbook.  When you are ready to remove the patch, follow instructions on the last 2 pages of Patient  Logbook. Stick patch monitor onto the last page of Patient Logbook.  Place Patient Logbook in the blue and white box. Use locking tab on box and tape box closed  securely. The blue and white box has prepaid postage on it. Please place it in the mailbox as  soon as possible. Your physician should have your test results approximately 7 days after the  monitor has been mailed back to [facility].  Call [facility] Customer Care at [phone] if you have questions regarding  your ZIO XT patch monitor. Call them immediately if you see an orange light blinking on your  monitor.  If your monitor falls off in less than 4 days, contact our Monitor department at [phone].  If your monitor becomes loose or falls off after 4 days call [employer] at [phone] for  suggestions on securing your monitor

## 2024-08-25 NOTE — Progress Notes (Unsigned)
 Enrolled for [employer] to mail a ZIO XT long term holter monitor to the patients address on file.

## 2024-08-26 ENCOUNTER — Telehealth: Payer: Self-pay | Admitting: Family

## 2024-08-26 NOTE — Telephone Encounter (Signed)
 Patient is rescheduled to 2/9 but has concerns for having dehydration and coughing a lot at night. States has a lot of mucus and having really dry lips Is asking for a call from clinical team member to see if can be seen sooner.

## 2024-08-29 ENCOUNTER — Inpatient Hospital Stay: Admitting: Family

## 2024-08-29 NOTE — Telephone Encounter (Signed)
 Please triage patient If she needs to be seen then I'd advise urgent care as we only have virtual appts and since she is recent discharge from hospital it'd be best to be seen in person

## 2024-08-30 NOTE — Telephone Encounter (Signed)
 I just wanted to pass this one along, I'm not sure if this could have been caught differently by the front desk but it was afternoon Friday and this pt was recently in the hospital. She stated she was worried she was dehydrated, it was sent to [facility] but they didn't see it and send to me until 2/2 so all weekend she was without an answer. Sending more as an FYI

## 2024-08-30 NOTE — Telephone Encounter (Signed)
 I spoke with pt; pt did not get earlier message because she has been sleeping. Pt said she is feeling better and has been drinking a lot of fluids and pt said she is not concerned about dehydration now and also her cough is doing better. Pt said she already has appt with T [name] FNP for2/9/26 but if pt condition changes or worsens prior to that appt uc & ED precautions given and pt voiced understanding.sending note to T DugalFNP.

## 2024-08-30 NOTE — Telephone Encounter (Signed)
 I did not work 08/29/24; Unable to reach patient by phone and left v/m requesting call back at [phone]. Pt may speak with E2C2 about additional info and triage. See ONEIDA Patrick FNP note below. Sending note to T Patrick FNP, [name] and [facility] triage.

## 2024-08-31 NOTE — Telephone Encounter (Signed)
 NOTED

## 2024-09-05 ENCOUNTER — Inpatient Hospital Stay: Admitting: Family

## 2024-12-02 ENCOUNTER — Ambulatory Visit: Admitting: Physician Assistant

## 2025-01-12 ENCOUNTER — Ambulatory Visit: Admitting: Internal Medicine
